# Patient Record
Sex: Male | Born: 1935 | Race: White | Hispanic: No | Marital: Single | Smoking: Former smoker
Health system: Southern US, Community
[De-identification: ages and names within clinical notes are randomized; demographics above are authoritative.]

## PROBLEM LIST (undated history)

## (undated) DIAGNOSIS — D126 Benign neoplasm of colon, unspecified: Secondary | ICD-10-CM

## (undated) DIAGNOSIS — I2510 Atherosclerotic heart disease of native coronary artery without angina pectoris: Secondary | ICD-10-CM

## (undated) DIAGNOSIS — E538 Deficiency of other specified B group vitamins: Secondary | ICD-10-CM

## (undated) DIAGNOSIS — I219 Acute myocardial infarction, unspecified: Secondary | ICD-10-CM

## (undated) DIAGNOSIS — N183 Chronic kidney disease, stage 3 unspecified: Secondary | ICD-10-CM

## (undated) DIAGNOSIS — G629 Polyneuropathy, unspecified: Secondary | ICD-10-CM

## (undated) DIAGNOSIS — R195 Other fecal abnormalities: Secondary | ICD-10-CM

## (undated) DIAGNOSIS — E669 Obesity, unspecified: Secondary | ICD-10-CM

## (undated) DIAGNOSIS — E119 Type 2 diabetes mellitus without complications: Secondary | ICD-10-CM

## (undated) DIAGNOSIS — E785 Hyperlipidemia, unspecified: Secondary | ICD-10-CM

## (undated) DIAGNOSIS — L409 Psoriasis, unspecified: Secondary | ICD-10-CM

## (undated) DIAGNOSIS — D649 Anemia, unspecified: Secondary | ICD-10-CM

## (undated) DIAGNOSIS — F419 Anxiety disorder, unspecified: Secondary | ICD-10-CM

## (undated) DIAGNOSIS — I10 Essential (primary) hypertension: Secondary | ICD-10-CM

## (undated) DIAGNOSIS — I82409 Acute embolism and thrombosis of unspecified deep veins of unspecified lower extremity: Secondary | ICD-10-CM

## (undated) DIAGNOSIS — F329 Major depressive disorder, single episode, unspecified: Secondary | ICD-10-CM

## (undated) DIAGNOSIS — G4733 Obstructive sleep apnea (adult) (pediatric): Secondary | ICD-10-CM

## (undated) DIAGNOSIS — K5790 Diverticulosis of intestine, part unspecified, without perforation or abscess without bleeding: Secondary | ICD-10-CM

## (undated) DIAGNOSIS — F32A Depression, unspecified: Secondary | ICD-10-CM

## (undated) HISTORY — PX: CORONARY STENT PLACEMENT: SHX1402

## (undated) HISTORY — DX: Essential (primary) hypertension: I10

## (undated) HISTORY — DX: Chronic kidney disease, stage 3 (moderate): N18.3

## (undated) HISTORY — DX: Hyperlipidemia, unspecified: E78.5

## (undated) HISTORY — DX: Obstructive sleep apnea (adult) (pediatric): G47.33

## (undated) HISTORY — DX: Anxiety disorder, unspecified: F41.9

## (undated) HISTORY — DX: Benign neoplasm of colon, unspecified: D12.6

## (undated) HISTORY — PX: INGUINAL HERNIA REPAIR: SUR1180

## (undated) HISTORY — DX: Diverticulosis of intestine, part unspecified, without perforation or abscess without bleeding: K57.90

## (undated) HISTORY — DX: Major depressive disorder, single episode, unspecified: F32.9

## (undated) HISTORY — DX: Chronic kidney disease, stage 3 unspecified: N18.30

## (undated) HISTORY — PX: CARDIAC CATHETERIZATION: SHX172

## (undated) HISTORY — DX: Type 2 diabetes mellitus without complications: E11.9

## (undated) HISTORY — DX: Psoriasis, unspecified: L40.9

## (undated) HISTORY — DX: Obesity, unspecified: E66.9

## (undated) HISTORY — DX: Deficiency of other specified B group vitamins: E53.8

## (undated) HISTORY — DX: Depression, unspecified: F32.A

## (undated) HISTORY — DX: Polyneuropathy, unspecified: G62.9

## (undated) HISTORY — DX: Acute myocardial infarction, unspecified: I21.9

## (undated) HISTORY — DX: Atherosclerotic heart disease of native coronary artery without angina pectoris: I25.10

## (undated) HISTORY — DX: Acute embolism and thrombosis of unspecified deep veins of unspecified lower extremity: I82.409

---

## 1993-05-29 DIAGNOSIS — D126 Benign neoplasm of colon, unspecified: Secondary | ICD-10-CM

## 1993-05-29 HISTORY — DX: Benign neoplasm of colon, unspecified: D12.6

## 1997-12-04 ENCOUNTER — Inpatient Hospital Stay (HOSPITAL_COMMUNITY): Admission: EM | Admit: 1997-12-04 | Discharge: 1997-12-05 | Payer: Self-pay | Admitting: Emergency Medicine

## 1998-02-09 ENCOUNTER — Encounter (HOSPITAL_COMMUNITY): Admission: RE | Admit: 1998-02-09 | Discharge: 1998-05-10 | Payer: Self-pay | Admitting: Cardiology

## 1999-01-09 ENCOUNTER — Encounter: Payer: Self-pay | Admitting: Pulmonary Disease

## 1999-01-09 ENCOUNTER — Ambulatory Visit: Admission: RE | Admit: 1999-01-09 | Discharge: 1999-01-09 | Payer: Self-pay | Admitting: Pulmonary Disease

## 1999-02-09 ENCOUNTER — Encounter: Payer: Self-pay | Admitting: Pulmonary Disease

## 1999-08-03 ENCOUNTER — Ambulatory Visit: Admission: RE | Admit: 1999-08-03 | Discharge: 1999-08-03 | Payer: Self-pay | Admitting: Pulmonary Disease

## 2000-12-12 ENCOUNTER — Encounter: Admission: RE | Admit: 2000-12-12 | Discharge: 2000-12-12 | Payer: Self-pay | Admitting: Family Medicine

## 2000-12-12 ENCOUNTER — Encounter: Payer: Self-pay | Admitting: Emergency Medicine

## 2000-12-12 ENCOUNTER — Emergency Department (HOSPITAL_COMMUNITY): Admission: EM | Admit: 2000-12-12 | Discharge: 2000-12-12 | Payer: Self-pay | Admitting: Emergency Medicine

## 2000-12-12 ENCOUNTER — Encounter: Payer: Self-pay | Admitting: Family Medicine

## 2002-12-09 ENCOUNTER — Ambulatory Visit (HOSPITAL_COMMUNITY): Admission: RE | Admit: 2002-12-09 | Discharge: 2002-12-09 | Payer: Self-pay | Admitting: Family Medicine

## 2002-12-17 ENCOUNTER — Ambulatory Visit (HOSPITAL_COMMUNITY): Admission: RE | Admit: 2002-12-17 | Discharge: 2002-12-17 | Payer: Self-pay | Admitting: Family Medicine

## 2003-06-02 ENCOUNTER — Encounter: Admission: RE | Admit: 2003-06-02 | Discharge: 2003-08-31 | Payer: Self-pay | Admitting: Family Medicine

## 2003-08-26 ENCOUNTER — Encounter: Admission: RE | Admit: 2003-08-26 | Discharge: 2003-08-26 | Payer: Self-pay | Admitting: Family Medicine

## 2003-10-20 ENCOUNTER — Encounter: Admission: RE | Admit: 2003-10-20 | Discharge: 2004-01-18 | Payer: Self-pay | Admitting: Family Medicine

## 2004-01-28 ENCOUNTER — Inpatient Hospital Stay (HOSPITAL_COMMUNITY): Admission: AD | Admit: 2004-01-28 | Discharge: 2004-01-30 | Payer: Self-pay | Admitting: Family Medicine

## 2004-02-01 ENCOUNTER — Ambulatory Visit (HOSPITAL_COMMUNITY): Admission: RE | Admit: 2004-02-01 | Discharge: 2004-02-01 | Payer: Self-pay | Admitting: Internal Medicine

## 2004-04-13 ENCOUNTER — Ambulatory Visit: Payer: Self-pay | Admitting: Pulmonary Disease

## 2004-11-18 ENCOUNTER — Ambulatory Visit (HOSPITAL_COMMUNITY): Admission: RE | Admit: 2004-11-18 | Discharge: 2004-11-18 | Payer: Self-pay | Admitting: Family Medicine

## 2004-12-26 ENCOUNTER — Ambulatory Visit (HOSPITAL_COMMUNITY): Admission: RE | Admit: 2004-12-26 | Discharge: 2004-12-26 | Payer: Self-pay | Admitting: Family Medicine

## 2004-12-30 ENCOUNTER — Ambulatory Visit (HOSPITAL_COMMUNITY): Admission: RE | Admit: 2004-12-30 | Discharge: 2004-12-30 | Payer: Self-pay | Admitting: Family Medicine

## 2005-03-20 ENCOUNTER — Ambulatory Visit: Payer: Self-pay | Admitting: Cardiology

## 2005-06-01 ENCOUNTER — Inpatient Hospital Stay (HOSPITAL_COMMUNITY): Admission: EM | Admit: 2005-06-01 | Discharge: 2005-06-03 | Payer: Self-pay | Admitting: Emergency Medicine

## 2006-02-28 ENCOUNTER — Ambulatory Visit: Payer: Self-pay | Admitting: Gastroenterology

## 2006-03-23 ENCOUNTER — Encounter (INDEPENDENT_AMBULATORY_CARE_PROVIDER_SITE_OTHER): Payer: Self-pay | Admitting: *Deleted

## 2006-03-23 ENCOUNTER — Ambulatory Visit: Payer: Self-pay | Admitting: Gastroenterology

## 2009-10-09 ENCOUNTER — Ambulatory Visit: Payer: Self-pay | Admitting: Cardiology

## 2009-10-09 ENCOUNTER — Inpatient Hospital Stay (HOSPITAL_COMMUNITY): Admission: EM | Admit: 2009-10-09 | Discharge: 2009-10-13 | Payer: Self-pay | Admitting: Emergency Medicine

## 2009-10-10 ENCOUNTER — Encounter: Payer: Self-pay | Admitting: Cardiology

## 2009-10-11 ENCOUNTER — Encounter (INDEPENDENT_AMBULATORY_CARE_PROVIDER_SITE_OTHER): Payer: Self-pay | Admitting: Internal Medicine

## 2009-10-11 HISTORY — PX: CHOLECYSTECTOMY: SHX55

## 2009-10-21 ENCOUNTER — Telehealth (INDEPENDENT_AMBULATORY_CARE_PROVIDER_SITE_OTHER): Payer: Self-pay | Admitting: *Deleted

## 2009-10-29 DIAGNOSIS — F411 Generalized anxiety disorder: Secondary | ICD-10-CM | POA: Insufficient documentation

## 2009-10-29 DIAGNOSIS — G4733 Obstructive sleep apnea (adult) (pediatric): Secondary | ICD-10-CM

## 2009-10-29 DIAGNOSIS — K5730 Diverticulosis of large intestine without perforation or abscess without bleeding: Secondary | ICD-10-CM | POA: Insufficient documentation

## 2009-10-29 DIAGNOSIS — E785 Hyperlipidemia, unspecified: Secondary | ICD-10-CM | POA: Insufficient documentation

## 2009-10-29 DIAGNOSIS — E119 Type 2 diabetes mellitus without complications: Secondary | ICD-10-CM

## 2009-10-29 DIAGNOSIS — I10 Essential (primary) hypertension: Secondary | ICD-10-CM | POA: Insufficient documentation

## 2009-11-01 ENCOUNTER — Ambulatory Visit: Payer: Self-pay | Admitting: Pulmonary Disease

## 2009-11-01 ENCOUNTER — Telehealth (INDEPENDENT_AMBULATORY_CARE_PROVIDER_SITE_OTHER): Payer: Self-pay | Admitting: *Deleted

## 2009-11-01 DIAGNOSIS — I82409 Acute embolism and thrombosis of unspecified deep veins of unspecified lower extremity: Secondary | ICD-10-CM

## 2009-11-03 ENCOUNTER — Telehealth: Payer: Self-pay | Admitting: Pulmonary Disease

## 2009-11-05 ENCOUNTER — Telehealth (INDEPENDENT_AMBULATORY_CARE_PROVIDER_SITE_OTHER): Payer: Self-pay | Admitting: *Deleted

## 2009-11-08 ENCOUNTER — Telehealth (INDEPENDENT_AMBULATORY_CARE_PROVIDER_SITE_OTHER): Payer: Self-pay | Admitting: *Deleted

## 2009-11-08 ENCOUNTER — Encounter: Payer: Self-pay | Admitting: Pulmonary Disease

## 2010-06-30 NOTE — Letter (Signed)
Summary: CMN for PAP/[facility]  CMN for PAP/[facility]   Imported By: Sherian Rein 11/11/2009 09:08:05  _____________________________________________________________________  External Attachment:    Type:   Image     Comment:   External Document

## 2010-06-30 NOTE — Progress Notes (Signed)
Summary: pt at [facility] now- wants supplies  Phone Note From Other Clinic   Caller: anita w/ [facility] Call For: clance Summary of Call: called back- says pt is in their office now and want supplies asap. anita # [phone] Initial call taken by: Tivis Ringer, CNA,  November 08, 2009 2:47 PM  Follow-up for Phone Call        forms on KC's desk per 11-08-09 phone note.    called spoke with anita who states that they have all of the patient's supplies ready to give him but CANNOT do this until the precert and cmn are signed by kc AND faxed back to [facility].  Megan, please make sure this is done thanks. Boone Master CNA/MA  November 08, 2009 3:00 PM   Additional Follow-up for Phone Call Additional follow up Details #1::        paperwork in [facility] very important look at folder.  Arman Filter LPN  November 08, 2009 3:25 PM     Additional Follow-up for Phone Call Additional follow up Details #2::    paperwork sign and faxed back to [facility].  Arman Filter LPN  November 08, 2009 5:17 PM

## 2010-06-30 NOTE — Medication Information (Signed)
Summary: PAP Supplies/[facility]  CMN for PAP Supplies/[facility]   Imported By: Sherian Rein 11/11/2009 09:06:03  _____________________________________________________________________  External Attachment:    Type:   Image     Comment:   External Document

## 2010-06-30 NOTE — Medication Information (Signed)
Summary: [facility]  [facility]   Imported By: Sherian Rein 11/01/2009 11:42:47  _____________________________________________________________________  External Attachment:    Type:   Image     Comment:   External Document

## 2010-06-30 NOTE — Progress Notes (Signed)
Summary: talk to nurse  Phone Note Call from Patient Call back at Home Phone [phone]   Caller: Spouse//pat Call For: clance Reason for Call: Talk to Nurse Summary of Call: Needs a new cpap mask, KC last saw him in "05. Initial call taken by: Darletta Moll,  Oct 21, 2009 3:53 PM  Follow-up for Phone Call        PT AWARE UNABLE TO GIVE RX FOR NEW MASK PT WILL NEED OV WITH KC FOR THIS, SHEDULED PT TO SEE KC 6/6 AT 9:45 Follow-up by: Philipp Deputy CMA,  Oct 21, 2009 4:20 PM

## 2010-06-30 NOTE — Progress Notes (Signed)
Summary: [facility]   Imported By: Sherian Rein 11/01/2009 11:39:27  _____________________________________________________________________  External Attachment:    Type:   Image     Comment:   External Document

## 2010-06-30 NOTE — Progress Notes (Signed)
Summary: oxygen needs to be d/c  Phone Note Call from Patient Call back at Home Phone [phone]   Caller: wife- pat Call For: Dandy Lazaro Summary of Call: pt saw you monday needs oxygen d/c [facility] Initial call taken by: Lacinda Axon,  November 03, 2009 2:14 PM  Follow-up for Phone Call        [facility]. Carron Curie CMA  November 03, 2009 2:30 PM  Pt wife states that they spoke to [facility] at last ov about pt using oxygen with his Cpap and KC told them he does not need this so they are requesting an order be placed to [facility] to have O2 d/c. Please advise if ok to send order.Carron Curie CMA  November 03, 2009 4:08 PM   Additional Follow-up for Phone Call Additional follow up Details #1::        I do not recall telling him this, and there is no mention of it in my note.  I need his chart to be pulled to see where the oxygen came from.Marland KitchenMarland KitchenMarland KitchenI do not think we ever ordered his oxygen. Additional Follow-up by: Barbaraann Share MD,  November 03, 2009 5:27 PM    Additional Follow-up for Phone Call Additional follow up Details #2::    ordered chart which is at [address].  Aundra Millet Reynolds LPN  November 04, 979 5:28 PM   chart on KC's very important look at folder.  Aundra Millet Reynolds LPN  November 04, 1912 8:45 AM   Additional Follow-up for Phone Call Additional follow up Details #3:: Details for Additional Follow-up Action Taken: I have reviewed his old chart.  I did not start him on oxyen, there is no record who did or for what reason?  We need to find out who started and why?  please discuss with pcc. Additional Follow-up by: Barbaraann Share MD,  November 04, 2009 1:48 PM  Almyra Free called Mayra Reel and left a message to have her check to see who ordered the oxygen. Carron Curie CMA  November 04, 2009 2:37 PM  Mayra Reel states original order for oxygen came from Dr. Cleatis Polka. which looks like he is pt PMD according to hospital records. I called Dr. Leandro Reasoner office but he is out until  tomorrow so Ieft a message with nurse to find out why pt was started on O2.  Carron Curie CMA  November 04, 2009 3:36 PM  Per [facility] if PMD ordered oxygen then they need to d/c it. Pt aware to contact Dr. Clelia Croft to d/c O2. Carron Curie CMA  November 04, 2009 3:48 PM [Prescriptions]

## 2010-06-30 NOTE — Progress Notes (Signed)
Summary: [facility] cmn, recert  Phone Note Call from Patient Call back at Home Phone [phone]   Caller: Patient Call For: clance Reason for Call: Talk to Nurse Summary of Call: Patient calling asking about a letter from [facility] for oxygen.  Requesting a call ASAP. Initial call taken by: Lehman Prom,  November 05, 2009 10:34 AM  Follow-up for Phone Call        Pt states that went to [facility] to get new mask and supplies, order placed by [facility] at last OV, but they were told they could not have any of the supplies until a form is signed by [facility] that was faxed on Wed.  Aundra Millet do you have these forms? Please advise.Carron Curie CMA  November 05, 2009 11:29 AM  have not seen paperwork on this pt.  called and spoke with [facility] and re-requested order be faxed to [place].  Aundra Millet Reynolds LPN  November 05, 2009 3:32 PM   still no paperwork sent from [place].  Called and LM with Operator for [facility] to call me back ( as they were currently in a meeting).  Aundra Millet Reynolds LPN  November 08, 2009 9:40 AM   Additional Follow-up for Phone Call Additional follow up Details #1::        returning call [phone] annita .Marland KitchenChantel Bowne  November 08, 2009 1:04 PM  returned anita's call.  she states that she faxed the recert and cmn friday to [phone], but i informed her that Aundra Millet still hasn't received the forms.  i then gave her the triage fax # and she will fax them now.  will hand to [facility] when they arrive. Additional Follow-up by: Boone Master CNA/MA,  November 08, 2009 1:55 PM    Additional Follow-up for Phone Call Additional follow up Details #2::    forms received and handed to [facility]. Boone Master CNA/MA  November 08, 2009 1:57 PM    Appended Document: [facility] cmn, recert put forms in KC's very important look at folder for him to review.

## 2010-06-30 NOTE — Assessment & Plan Note (Signed)
Summary: self referral for management of osa   Copy to:  Self- referral.   Primary Provider/Referring Provider:  Martha Clan  CC:   Former pt.  Last seen by Dr. Shelle Iron in 2005. Marland Kitchen  History of Present Illness: The pt is an 77y/o male who comes in today as a self referral to re-establish for management of osa.  I have not seen the pt since 2005, and he is in need of supplies and a new mask.  He was diagnosed with severe osa in 2000 with AHI of 50/hr and desat to 69%.  He was started on cpap and did well, and received a new machine in 2005.  His machine was replaced again a few years back and currently is in good working order.  His current mask is leaking excessively.  He goes to bed at 11pm, and arises at 9am to start his day.  He feels rested upon arising, and denies any breakthru snoring.  He feels that his alertness is adequate  during the day, and he has no issues watching tv or reading.  His weight is actually down 27 pounds since the last time I saw him.  HIs epworth score today is 7.  Preventive Screening-Counseling & Management  Alcohol-Tobacco     Smoking Status: quit  Allergies (verified): No Known Drug Allergies  Past History:  Past Medical History: CAD DVT (ICD-453.40)-on coumadin OBSTRUCTIVE SLEEP APNEA (ICD-327.23) ANXIETY (ICD-300.00) HYPERLIPIDEMIA (ICD-272.4) DIABETES MELLITUS, TYPE II (ICD-250.00) DIVERTICULOSIS, COLON (ICD-562.10) HYPERTENSION (ICD-401.9)    Past Surgical History: cholecystectomy  Oct 11, 2009 stent  Family History: Reviewed history and no changes required. emphysema: mother  Social History: Reviewed history and no changes required. Patient states former smoker.  started at age 30.  2 ppd.  quit 1980. pt is married. pt has children. pt is retired.  prev worked with [employer].  Smoking Status:  quit  Review of Systems       The patient complains of weight change.  The patient denies shortness of breath with activity, shortness of  breath at rest, productive cough, non-productive cough, coughing up blood, chest pain, irregular heartbeats, acid heartburn, indigestion, loss of appetite, abdominal pain, difficulty swallowing, sore throat, tooth/dental problems, headaches, nasal congestion/difficulty breathing through nose, sneezing, itching, ear ache, anxiety, depression, hand/feet swelling, joint stiffness or pain, rash, change in color of mucus, and fever.    Vital Signs:  Patient profile:   77 year old male Height:      71 inches Weight:      211.31 pounds BMI:     29.58 O2 Sat:      95 % on Room air Temp:     97.7 degrees F oral Pulse rate:   61 / minute BP sitting:   126 / 70  (right arm) Cuff size:   regular  Vitals Entered By: Arman Filter LPN (November 01, 452 9:23 AM)  O2 Flow:  Room air CC:  Former pt.  Last seen by Dr. Shelle Iron in 2005.  Comments Unable to review meds with pt.  Pt did not bring meds or a med list with him to today's visit and did not know what meds he is currently taking. Arman Filter LPN  November 02, 979 9:28 AM    Physical Exam  General:  89 male in nad Eyes:  PERRLA and EOMI.   Nose:  patent without discharge, no crusting no skin breakdown or pressure necrosis from cpap mask Mouth:  clear Neck:  no  jvd, tmg, LN Lungs:  clear to auscultation Heart:  rrr, no mrg Abdomen:  soft and nontender, bs+ Extremities:  mild edema and changes of venous stasis no cyanosis pulses intact distally Neurologic:  alert and oriented, moves all 4.   Impression & Recommendations:  Problem # 1:  OBSTRUCTIVE SLEEP APNEA (ICD-327.23) the pt has known severe osa, and has been compliant with cpap.  He feels that he sleeps fairly well, and is satisfied with his alertness during the day.  He needs a new mask and supplies, and I have encouraged him to continue working on weight loss.  I have stressed to him the importance that I see him yearly, and that I cannot write for his supplies and other items thru  his dme without seeing him at least yearly.  Other Orders: New Patient Level III (84696) DME Referral (DME)  Patient Instructions: 1)  will get you a new mask 2)  continue to work on weight loss 3)  please call if any issues with cpap 4)  followup with me in one year.

## 2010-06-30 NOTE — Progress Notes (Signed)
Summary: meds list  Phone Note Call from Patient Call back at Home Phone [phone]   Caller: Spouse Call For: clance Summary of Call: pt's wife calling to give nurse a list of his meds (about 10 of them). NOTE: pt was seen this am.  Initial call taken by: Tivis Ringer, CNA,  November 01, 2009 11:36 AM  Follow-up for Phone Call        Called, spoke with pt's wife.  Updated pt's med list.  Gweneth Dimitri RN  November 01, 2009 11:54 AM     New/Updated Medications: CRESTOR 20 MG TABS (ROSUVASTATIN CALCIUM) Take 1 tablet by mouth once a day FOLIC ACID 1 MG TABS (FOLIC ACID) Take 1 tablet by mouth once a day ISOSORBIDE MONONITRATE CR 60 MG XR24H-TAB (ISOSORBIDE MONONITRATE) Take 1 tablet by mouth once a day METFORMIN HCL 500 MG TABS (METFORMIN HCL) Take 1 tablet by mouth two times a day METOPROLOL TARTRATE 100 MG TABS (METOPROLOL TARTRATE) 1/2 tab two times a day NIASPAN 1000 MG CR-TABS (NIACIN (ANTIHYPERLIPIDEMIC)) Take 1 tab by mouth at bedtime PAROXETINE HCL 20 MG TABS (PAROXETINE HCL) Take 1 tab by mouth at bedtime RAMIPRIL 2.5 MG CAPS (RAMIPRIL) Take 1 capsule by mouth once a day TRICOR 145 MG TABS (FENOFIBRATE) Take 1 tablet by mouth once a day WARFARIN SODIUM 3 MG TABS (WARFARIN SODIUM) as directed ASPIR-LOW 81 MG TBEC (ASPIRIN) Take 1 tablet by mouth once a day

## 2010-08-15 LAB — PROTIME-INR
INR: 1.45 (ref 0.00–1.49)
INR: 1.63 — ABNORMAL HIGH (ref 0.00–1.49)
Prothrombin Time: 17.5 seconds — ABNORMAL HIGH (ref 11.6–15.2)
Prothrombin Time: 19.2 seconds — ABNORMAL HIGH (ref 11.6–15.2)
Prothrombin Time: 19.6 seconds — ABNORMAL HIGH (ref 11.6–15.2)
Prothrombin Time: 19.6 seconds — ABNORMAL HIGH (ref 11.6–15.2)

## 2010-08-15 LAB — CBC
HCT: 32.7 % — ABNORMAL LOW (ref 39.0–52.0)
HCT: 33.4 % — ABNORMAL LOW (ref 39.0–52.0)
HCT: 34 % — ABNORMAL LOW (ref 39.0–52.0)
HCT: 34.7 % — ABNORMAL LOW (ref 39.0–52.0)
Hemoglobin: 11 g/dL — ABNORMAL LOW (ref 13.0–17.0)
Hemoglobin: 11.7 g/dL — ABNORMAL LOW (ref 13.0–17.0)
Hemoglobin: 11.8 g/dL — ABNORMAL LOW (ref 13.0–17.0)
Hemoglobin: 12 g/dL — ABNORMAL LOW (ref 13.0–17.0)
MCHC: 33.8 g/dL (ref 30.0–36.0)
MCHC: 34.5 g/dL (ref 30.0–36.0)
MCHC: 34.6 g/dL (ref 30.0–36.0)
MCHC: 35.2 g/dL (ref 30.0–36.0)
MCV: 95.8 fL (ref 78.0–100.0)
MCV: 96.8 fL (ref 78.0–100.0)
MCV: 96.8 fL (ref 78.0–100.0)
Platelets: 200 10*3/uL (ref 150–400)
Platelets: 215 10*3/uL (ref 150–400)
RBC: 3.5 MIL/uL — ABNORMAL LOW (ref 4.22–5.81)
RBC: 3.54 MIL/uL — ABNORMAL LOW (ref 4.22–5.81)
RBC: 3.58 MIL/uL — ABNORMAL LOW (ref 4.22–5.81)
RDW: 13.7 % (ref 11.5–15.5)
RDW: 13.9 % (ref 11.5–15.5)
RDW: 14.3 % (ref 11.5–15.5)
WBC: 6.2 10*3/uL (ref 4.0–10.5)
WBC: 6.7 10*3/uL (ref 4.0–10.5)

## 2010-08-15 LAB — GLUCOSE, CAPILLARY
Glucose-Capillary: 115 mg/dL — ABNORMAL HIGH (ref 70–99)
Glucose-Capillary: 115 mg/dL — ABNORMAL HIGH (ref 70–99)
Glucose-Capillary: 135 mg/dL — ABNORMAL HIGH (ref 70–99)
Glucose-Capillary: 172 mg/dL — ABNORMAL HIGH (ref 70–99)
Glucose-Capillary: 93 mg/dL (ref 70–99)
Glucose-Capillary: 95 mg/dL (ref 70–99)

## 2010-08-15 LAB — COMPREHENSIVE METABOLIC PANEL
ALT: 21 U/L (ref 0–53)
ALT: 28 U/L (ref 0–53)
AST: 26 U/L (ref 0–37)
Albumin: 2.8 g/dL — ABNORMAL LOW (ref 3.5–5.2)
Alkaline Phosphatase: 61 U/L (ref 39–117)
Alkaline Phosphatase: 77 U/L (ref 39–117)
BUN: 11 mg/dL (ref 6–23)
BUN: 11 mg/dL (ref 6–23)
BUN: 12 mg/dL (ref 6–23)
CO2: 24 mEq/L (ref 19–32)
Calcium: 8.5 mg/dL (ref 8.4–10.5)
Creatinine, Ser: 0.7 mg/dL (ref 0.4–1.5)
GFR calc Af Amer: >60 mL/min (ref >60)
GFR calc non Af Amer: >60 mL/min (ref >60)
Glucose, Bld: 109 mg/dL — ABNORMAL HIGH (ref 70–99)
Glucose, Bld: 137 mg/dL — ABNORMAL HIGH (ref 70–99)
Glucose, Bld: 143 mg/dL — ABNORMAL HIGH (ref 70–99)
Potassium: 3.9 mEq/L (ref 3.5–5.1)
Potassium: 3.9 mEq/L (ref 3.5–5.1)
Potassium: 4.4 mEq/L (ref 3.5–5.1)
Sodium: 135 mEq/L (ref 135–145)
Sodium: 135 mEq/L (ref 135–145)
Total Bilirubin: 0.8 mg/dL (ref 0.3–1.2)
Total Protein: 6 g/dL (ref 6.0–8.3)
Total Protein: 6.4 g/dL (ref 6.0–8.3)

## 2010-08-15 LAB — HEPARIN LEVEL (UNFRACTIONATED)
Heparin Unfractionated: 0.13 IU/mL — ABNORMAL LOW (ref 0.30–0.70)
Heparin Unfractionated: <0.1 IU/mL — ABNORMAL LOW (ref 0.30–0.70)
Heparin Unfractionated: <0.1 IU/mL — ABNORMAL LOW (ref 0.30–0.70)
Heparin Unfractionated: <0.1 IU/mL — ABNORMAL LOW (ref 0.30–0.70)

## 2010-08-16 LAB — COMPREHENSIVE METABOLIC PANEL
ALT: 17 U/L (ref 0–53)
AST: 23 U/L (ref 0–37)
Albumin: 3.4 g/dL — ABNORMAL LOW (ref 3.5–5.2)
Alkaline Phosphatase: 48 U/L (ref 39–117)
BUN: 19 mg/dL (ref 6–23)
CO2: 26 mEq/L (ref 19–32)
CO2: 27 mEq/L (ref 19–32)
Calcium: 8.8 mg/dL (ref 8.4–10.5)
Chloride: 102 mEq/L (ref 96–112)
Creatinine, Ser: 1.1 mg/dL (ref 0.4–1.5)
GFR calc Af Amer: >60 mL/min (ref >60)
GFR calc non Af Amer: >60 mL/min (ref >60)
Glucose, Bld: 102 mg/dL — ABNORMAL HIGH (ref 70–99)
Glucose, Bld: 108 mg/dL — ABNORMAL HIGH (ref 70–99)
Potassium: 4.5 mEq/L (ref 3.5–5.1)
Sodium: 135 mEq/L (ref 135–145)
Total Bilirubin: 0.9 mg/dL (ref 0.3–1.2)
Total Protein: 6.6 g/dL (ref 6.0–8.3)

## 2010-08-16 LAB — GLUCOSE, CAPILLARY
Glucose-Capillary: 105 mg/dL — ABNORMAL HIGH (ref 70–99)
Glucose-Capillary: 115 mg/dL — ABNORMAL HIGH (ref 70–99)
Glucose-Capillary: 138 mg/dL — ABNORMAL HIGH (ref 70–99)
Glucose-Capillary: 176 mg/dL — ABNORMAL HIGH (ref 70–99)

## 2010-08-16 LAB — URINALYSIS, ROUTINE W REFLEX MICROSCOPIC
Bilirubin Urine: NEGATIVE
Glucose, UA: NEGATIVE mg/dL
Hgb urine dipstick: NEGATIVE
Protein, ur: NEGATIVE mg/dL
Urobilinogen, UA: 1 mg/dL (ref 0.0–1.0)

## 2010-08-16 LAB — CBC
HCT: 37.6 % — ABNORMAL LOW (ref 39.0–52.0)
Hemoglobin: 12.6 g/dL — ABNORMAL LOW (ref 13.0–17.0)
MCHC: 34.4 g/dL (ref 30.0–36.0)
MCV: 96.2 fL (ref 78.0–100.0)
Platelets: 208 10*3/uL (ref 150–400)
RBC: 3.76 MIL/uL — ABNORMAL LOW (ref 4.22–5.81)
RDW: 14 % (ref 11.5–15.5)
WBC: 12.7 10*3/uL — ABNORMAL HIGH (ref 4.0–10.5)

## 2010-08-16 LAB — DIFFERENTIAL
Basophils Absolute: 0 10*3/uL (ref 0.0–0.1)
Lymphocytes Relative: 12 % (ref 12–46)
Neutro Abs: 9.8 10*3/uL — ABNORMAL HIGH (ref 1.7–7.7)
Neutrophils Relative %: 77 % (ref 43–77)

## 2010-08-16 LAB — PROTIME-INR: Prothrombin Time: 19 seconds — ABNORMAL HIGH (ref 11.6–15.2)
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
## 2010-10-14 NOTE — Discharge Summary (Signed)
Alejandro Russell, Alejandro Russell               ACCOUNT NO.:  [id number]   MEDICAL RECORD NO.:  [id number]          PATIENT TYPE:  INP   LOCATION:  4710                         FACILITY:  [facility]   PHYSICIAN:  Marcie Mowers, M.D.DATE OF BIRTH:  06/03/1927   DATE OF ADMISSION:  01/28/2004  DATE OF DISCHARGE:  01/30/2004                                 DISCHARGE SUMMARY   DISCHARGE DIAGNOSES:  1.  Acute left lower extremity deep venous thrombosis.  2. Diabetes, type 2.      3. Hypertension.  4. Dyslipidemia.  5. Pulmonary nodules on chest x-ray.      6. Obstructive sleep apnea.   DISCHARGE MEDICATIONS:  1.  Coumadin 2.5 mg two pills to be taken every evening.  2. Avandia 2 mg      daily.  3. Lopressor 12.5 mg one pill daily.  4. Aspirin 81 mg daily.      5. Tricor 145 mg daily.  6. Paxil 20 mg daily.  7. Isosorbide      mononitrate 30 mg p.o. daily.  8. Lovenox 105 mg subcu every 12 hours.   ADMISSION HISTORY AND PHYSICAL:  In short, the patient is a 77 year old  Caucasian male with history of diabetes, type 2, superficial phlebitis,  hypertension, obstructive sleep apnea, was recently on a cruise for one  week.  He presented to the emergency department with left lower extremity  pain for three weeks.  He saw his primary care physician, Dr. Andrey Campanile, on  September 1 and had a lower extremity Doppler which showed extensive DVT in  his left lower extremity.  For this reason, he was admitted to [facility].  Upon admission, the patient did not have any chest pain,  shortness of breath, fever, nausea, vomiting, or diarrhea.   CURRENT MEDICATIONS:  1.  Avandia.  2. Imdur.  3. Paxil.  4. Lopressor.  5. Tricor.  6. Baby      aspirin.   ALLERGIES:  No known drug allergies.   PHYSICAL EXAMINATION:  Normal except for the tenderness in his left lower  extremity.  For a detailed H&P please refer to the history and physical note  done by Dr. Mikeal Hawthorne on January 28, 2004.   ADMISSION  LABORATORIES:  WBC count of 9.2, hemoglobin 14.9, hematocrit of  42.8, MCV 95.4.  The differential was normal.  Sodium 132, potassium of 5.1,  chloride 98, CO2 26, glucose 102, BUN 21, creatinine of 1.2.  Total  bilirubin 1.0, alkaline phosphatase 42, AST 19, ALT 15, total protein 7.  Albumin 3.7, calcium 8.9.  His PT was 13, INR was 1.0, PTT 34.  Hemoglobin  A1c was 6.3.   Lower extremity venous Doppler study which was done at [facility] showed acute DVT in the left lower extremity with minimal flow noted  from distal posterior tibial vein through the popliteal and into the distal  femoral vein.  Superficial thrombosis of greater saphenous vein proximal.  No evidence of Baker's cyst.   HOSPITAL COURSE:  Problem 1. Acute left lower extremity deep venous  thrombosis.  The patient was admitted for anticoagulation.  He was started  on Lovenox and Coumadin as well.  The patient and wife were taught Lovenox  administration.  The goal INR was between 2 and 3.  His admission INR was  1.0.  On the day of discharge, his INR was still 1.3, although because the  patient and his wife were comfortable with Lovenox administration the  patient was discharged home with Lovenox 105 mg every 12 hours subcu and on  Coumadin 5 mg q.h.s.  He was given a prescription to get his INR checked in  the [facility] on February 01, 2004 and the results were to  be reported to his primary care physician, Dr. Andrey Campanile.   Problem 2. Diabetes, type 2.  Throughout the hospital stay, his blood sugars  were well-controlled.  His hemoglobin A1c was 6.3.  He was continued on his  home dose of 2 mg daily of Avandia.   Problem 3. Hypertension.  Throughout the hospital stay his blood pressure  was well-controlled on Lopressor 12.5 mg daily and he was also on isosorbide  mononitrate.   Problem 4. Dyslipidemia.  Was on Tricor throughout his hospital stay.   Problem 5. The patient had chest x-ray  done on January 29, 2004 which  showed three or four small nodules in the lungs, reportedly stable since the  CT of his chest done five months ago.  The patient could be worked up as an  outpatient, but throughout current hospital stay he had no suggestive  symptoms.   Problem 6. Obstructive sleep apnea.  The patient was stable throughout his  hospital stay.  He was continued on his nightly CPAP.  The patient was on  Paxil at home for a long time, aware of the drug interaction between Paxil  and Coumadin and that Paxil may increase the levels of Coumadin.  The  patient was to have a regular INR check as an outpatient.  If required, the  dose of Paxil could be adjusted accordingly.   DISCHARGE LABORATORIES:  His INR was 1.3, PT 15.5.  White count 5.9,  hemoglobin 15, hematocrit 42.7, platelet count was 323.   DISPOSITION:  The patient was to be discharged home with Lovenox 105 mg  subcu q.12h., Coumadin 5 mg p.o. q.h.s..  The INR was to be checked on  February 01, 2004 at outpatient lab at [facility], the results were  to be reported to his primary care physician, Dr. Margrett Rud.  The  patient was to follow up with his primary care physician for his regular INR  check and maintenance of Coumadin.  He was instructed to see Dr. Andrey Campanile on  Tuesday, February 02, 2004.  The patient was given the information brochure  on Lovenox.  He was instructed to avoid green leafy vegetables.       PMJ/MEDQ  D:  04/03/2004  T:  04/03/2004  Job:  [id number]   cc:   [place]. Andrey Campanile, M.D.  [id number]  [place]  [place] [address]  Fax: [phone]

## 2010-10-14 NOTE — Assessment & Plan Note (Signed)
[facility]                           GASTROENTEROLOGY OFFICE NOTE   NAME:Macomber, JEREMAINE MARAJ                      MRN:          [id number]  DATE:02/28/2006                            DOB:          1927/09/05    REASON FOR REFERRAL:  Personal history of colon polyps and Coumadin  anticoagulation.   HISTORY OF PRESENT ILLNESS:  Mr. Stauffer is a very nice 77 year old white male  who I have seen in the past.  He had a large tubulovillous adenoma removed  from his sigmoid colon endoscopically in 1995.  His most recent followup  colonoscopy was in July 2000 which showed only diverticulosis and  phlebectasias.  He has been maintained on Coumadin anticoagulation for  recurrent deep venous thromboses.  He relates no gastrointestinal complaints  and his gastrointestinal review of systems is entirely negative.  No family  history of colon cancer, colon polyps, or inflammatory bowel disease.   PAST MEDICAL HISTORY:  1. Hypertension.  2. Diverticulosis.  3. Diabetes.  4. Hyperlipidemia.  5. Anxiety.  6. Sleep apnea on CPAP.  7. Tubulovillous adenomatous colon polyps.   Current medications listed on the chart have been reviewed.   MEDICATION ALLERGIES:  None known.   SOCIAL HISTORY AND REVIEW OF SYSTEMS:  Per the handwritten form.   PHYSICAL EXAMINATION:  GENERAL:  No acute distress.  VITAL SIGNS:  Weight 227 pounds, height 6 feet, blood pressure is 130/62,  pulse 66 and regular.  HEENT:  Anicteric sclerae, oropharynx clear.  CHEST:  Clear to auscultation bilaterally.  CARDIAC:  Regular rate and rhythm without murmurs appreciated.  ABDOMEN:  Soft, nontender, nondistended, normal active bowel sounds.  No  palpable organomegaly, masses or hernias.  RECTAL:  Deferred to time of colonoscopy.  EXTREMITIES:  Without clubbing, cyanosis, or edema.  NEUROLOGIC:  Alert and oriented x3.  Grossly nonfocal.   LABORATORY DATA:  From January 30, 2006, per Dr Alver Fisher  office:  CMET and  CBC were unremarkable except for a mildly elevated potassium and elevated  MCV at 100.  Pro time was 35.4 seconds with an INR of 3.26 on Coumadin  anticoagulation.   ASSESSMENT AND PLAN:  Personal history of a tubulovillous adenomatous colon  polyp.  Ongoing Coumadin anticoagulation for deep venous thromboses.  Risks,  benefits and alternatives to colonoscopy with possible biopsy and possible  polypectomy off Coumadin anticoagulation discussed with the patient.  He  consents to proceed.  This will be scheduled electively.  Will hold Coumadin  for 5 days prior to the procedure if this is cleared by Dr. Clelia Croft.       Venita Lick. Russella Dar, MD, [facility]      MTS/MedQ  DD:  03/05/2006  DT:  03/06/2006  Job #:  [id number]   cc:   Kari Baars, M.D.
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
## 2011-02-14 ENCOUNTER — Encounter: Payer: Self-pay | Admitting: Pulmonary Disease

## 2011-02-15 ENCOUNTER — Ambulatory Visit (INDEPENDENT_AMBULATORY_CARE_PROVIDER_SITE_OTHER): Payer: 59 | Admitting: Pulmonary Disease

## 2011-02-15 ENCOUNTER — Encounter: Payer: Self-pay | Admitting: Pulmonary Disease

## 2011-02-15 VITALS — BP 138/64 | HR 67 | Temp 97.8°F | Ht 71.0 in | Wt 220.0 lb

## 2011-02-15 DIAGNOSIS — G4733 Obstructive sleep apnea (adult) (pediatric): Secondary | ICD-10-CM

## 2011-02-15 NOTE — Assessment & Plan Note (Signed)
The patient is doing well with CPAP, and feels that he is sleeping satisfactorily, and has adequate daytime alertness.  I have asked him to keep up with mask changes and supplies, and work aggressively on weight loss.  If he continues to do well, he is to followup with me in one year.

## 2011-02-15 NOTE — Patient Instructions (Signed)
Keep up with mask changes, and found out how old your cpap machine is.  If greater than 5-6, needs replacement. Work on weight loss followup with me in one year.

## 2011-02-15 NOTE — Progress Notes (Signed)
  Subjective:    Patient ID: Alejandro Russell, male    DOB: 1928-03-30, 77 y.o.   MRN: [id number]  HPI The patient comes in today for followup of his known severe obstructive sleep apnea.  He is wearing CPAP compliantly, and is having no issues with his mask fit or pressure.  He is due for a new mask, and I have also encouraged him to keep up with supplies.  He feels that he is sleeping well with adequate daytime alertness.  His weight has increased about 8 pounds since his last visit.   Review of Systems  Constitutional: Negative for fever and unexpected weight change.  HENT: Positive for rhinorrhea, sneezing and sinus pressure. Negative for ear pain, nosebleeds, congestion, sore throat, trouble swallowing, dental problem and postnasal drip.   Eyes: Positive for itching. Negative for redness.  Respiratory: Positive for cough. Negative for chest tightness, shortness of breath and wheezing.   Cardiovascular: Positive for leg swelling. Negative for palpitations.  Gastrointestinal: Negative for nausea and vomiting.  Genitourinary: Negative for dysuria.  Musculoskeletal: Negative for joint swelling.  Skin: Positive for rash.  Neurological: Negative for headaches.  Hematological: Does not bruise/bleed easily.  Psychiatric/Behavioral: Negative for dysphoric mood. The patient is not nervous/anxious.        Objective:   Physical Exam Overweight male in no acute distress No skin breakdown or pressure necrosis from the CPAP mask Lower extremities with minimal edema, no cyanosis noted Alert, does not appear to be sleepy, moves all 4 extremities.       Assessment & Plan:

## 2011-03-31 ENCOUNTER — Other Ambulatory Visit: Payer: Self-pay | Admitting: Dermatology

## 2011-04-05 ENCOUNTER — Encounter: Payer: Self-pay | Admitting: Gastroenterology

## 2011-04-05 DIAGNOSIS — E1121 Type 2 diabetes mellitus with diabetic nephropathy: Secondary | ICD-10-CM | POA: Insufficient documentation

## 2011-04-25 ENCOUNTER — Ambulatory Visit (INDEPENDENT_AMBULATORY_CARE_PROVIDER_SITE_OTHER): Payer: 59 | Admitting: Gastroenterology

## 2011-04-25 ENCOUNTER — Encounter: Payer: Self-pay | Admitting: Gastroenterology

## 2011-04-25 VITALS — BP 132/62 | HR 68 | Ht 71.0 in | Wt 225.6 lb

## 2011-04-25 DIAGNOSIS — R198 Other specified symptoms and signs involving the digestive system and abdomen: Secondary | ICD-10-CM

## 2011-04-25 DIAGNOSIS — Z86010 Personal history of colonic polyps: Secondary | ICD-10-CM

## 2011-04-25 DIAGNOSIS — Z7901 Long term (current) use of anticoagulants: Secondary | ICD-10-CM

## 2011-04-25 MED ORDER — PEG-KCL-NACL-NASULF-NA ASC-C 100 G PO SOLR: 1.0000 | Freq: Once | ORAL | Status: DC

## 2011-04-25 NOTE — Progress Notes (Signed)
History of Present Illness: This is an 77 year old male who notes larger stools and decreased frequency of bowel movements to about every third day for the past several months. He notes his stools are intermittently dark and occasionally black. He denies red, maroon, or tarry stools. He has a prior history of tubulovillous adenomatous colon polyps initially diagnosed in 1995. Last colonoscopy was performed in October 2007 showing an ascending colon lipoma, diverticulosis and phlebectasias. Denies weight loss, abdominal pain, diarrhea, melena, hematochezia, nausea, vomiting, dysphagia, reflux symptoms, chest pain.  No Known Allergies Outpatient Prescriptions Prior to Visit  Medication Sig Dispense Refill  . aspirin 81 MG tablet Take 81 mg by mouth daily.        . clobetasol (TEMOVATE) 0.05 % external solution Apply topically as needed.        . fenofibrate (TRICOR) 145 MG tablet Take 145 mg by mouth daily.        . folic acid (FOLVITE) 1 MG tablet Take 1 mg by mouth daily.        . isosorbide mononitrate (IMDUR) 60 MG 24 hr tablet Take 60 mg by mouth daily.        . metFORMIN (GLUCOPHAGE) 500 MG tablet Take 500 mg by mouth 2 (two) times daily.        . metoprolol (LOPRESSOR) 100 MG tablet Take 50 mg by mouth 2 (two) times daily.        . niacin (NIASPAN) 1000 MG CR tablet Take 1,000 mg by mouth at bedtime.        Marland Kitchen PARoxetine (PAXIL) 20 MG tablet Take 20 mg by mouth daily.        . ramipril (ALTACE) 2.5 MG capsule Take 2.5 mg by mouth daily.        . rosuvastatin (CRESTOR) 20 MG tablet Take 20 mg by mouth daily.        Marland Kitchen warfarin (COUMADIN) 3 MG tablet Take 3 mg by mouth as directed. M,W,F 1.5 mg / T TH, S, Sun 3 MG.       Past Medical History  Diagnosis Date  . CAD (coronary artery disease)   . DVT (deep venous thrombosis)     on coumadin  . OSA (obstructive sleep apnea)   . Anxiety   . Hyperlipidemia   . Type 2 diabetes mellitus   . Diverticulosis   . Hypertension   . Tubulovillous  adenoma of colon 1995  . Diverticulosis   . Psoriasis   . Vitamin B12 deficiency   . Obesity   . Myocardial infarct   . Peripheral neuropathy   . Chronic renal disease, stage III    Past Surgical History  Procedure Date  . Cholecystectomy may 16,2011  . Coronary stent placement   . Inguinal hernia repair    History   Social History  . Marital Status: Single    Spouse Name: N/A    Number of Children: Y  . Years of Education: N/A   Occupational History  . retired.  prev worked with computers    Social History Main Topics  . Smoking status: Former Smoker -- 2.0 packs/day for 31 years    Types: Cigarettes    Quit date: 05/29/1978  . Smokeless tobacco: None  . Alcohol Use: No     Hx of alcohol abuse  . Drug Use: No  . Sexually Active: None   Other Topics Concern  . None   Social History Narrative  . None   Family History  Problem  Relation Age of Onset  . Emphysema Mother   . Dementia Father     Review of Systems: Pertinent positive and negative review of systems were noted in the above HPI section. All other review of systems were otherwise negative.  Physical Exam: General: Well developed , well nourished, no acute distress Head: Normocephalic and atraumatic Eyes:  sclerae anicteric, EOMI Ears: Normal auditory acuity Mouth: No deformity or lesions Neck: Supple, no masses or thyromegaly Lungs: Clear throughout to auscultation Heart: Regular rate and rhythm; no murmurs, rubs or bruits Abdomen: Soft, non tender and non distended. No masses, hepatosplenomegaly or hernias noted. Normal Bowel sounds Rectal: Deferred to colonoscopy Musculoskeletal: Symmetrical with no gross deformities  Skin: No lesions on visible extremities Pulses:  Normal pulses noted Extremities: No clubbing, cyanosis, edema or deformities noted Neurological: Alert oriented x 4, memory deficits, otherwise grossly nonfocal Cervical Nodes:  No significant cervical adenopathy Inguinal Nodes:  No significant inguinal adenopathy Psychological:  Alert and cooperative. Normal mood and affect  Assessment and Recommendations:  1. Change in bowel habits with mild constipation, larger stools and occasional dark stools. Hemasure testing was negative. Rule out intermittent bleeding leading to dark stools but I suspect this is an insignificant variation in stool color. Increase dietary fiber and water intake. Schedule colonoscopy. He has multiple comorbidities including chronic Coumadin anticoagulation for a history of DVTs, obstructive sleep apnea, coronary artery disease and diabetes mellitus which slightly increases the risk of colonoscopy. The risks, benefits, and alternatives to colonoscopy with possible biopsy and possible polypectomy were discussed with the patient and they consent to proceed.   2. Personal history of tubulovillous adenomatous colon polyps. Colonoscopy as above. Given his age and comorbidities as outlined above, will not plan for any future surveillance screening colonoscopies after this colonoscopy.  3. Obstructive sleep apnea.  4. Coronary artery disease status post MI.  5. Coumadin anticoagulation for DVT. The risks, benefits and alternatives to five-day hold of Coumadin were discussed with the patient and he consents to proceed. Will obtain clearance from Dr. Clelia Croft.

## 2011-04-25 NOTE — Patient Instructions (Addendum)
You have been scheduled for a Colonoscopy with propofol. See separate instructions.  Pick up your prep kit from your pharmacy.  You will be contaced by our office prior to your procedure for directions on holding your Coumadin/Warfarin.  If you do not hear from our office 1 week prior to your scheduled procedure, please call [phone] to discuss. cc: Buren Kos, MD

## 2011-04-26 ENCOUNTER — Encounter: Payer: Self-pay | Admitting: Gastroenterology

## 2011-05-09 ENCOUNTER — Telehealth: Payer: Self-pay

## 2011-05-09 NOTE — Telephone Encounter (Signed)
Asked Cala Bradford (Dr. Alver Fisher nurse) at [facility] to clarify the anticoagulant fax that we received this morning on Alejandro Russell. Cala Bradford states that [name] came in to have his PT INR checked today and was told to hold coumadin for now and start Lovenox bridge tomorrow and was instructed on how to give himself SQ bid injections. He was also instructed on when to resume hi coumadin after his procedure. Left a message with patient to verify this with him.

## 2011-05-17 ENCOUNTER — Other Ambulatory Visit: Payer: Self-pay | Admitting: Gastroenterology

## 2011-05-17 ENCOUNTER — Encounter: Payer: Self-pay | Admitting: Gastroenterology

## 2011-05-17 ENCOUNTER — Ambulatory Visit (AMBULATORY_SURGERY_CENTER): Payer: 59 | Admitting: Gastroenterology

## 2011-05-17 DIAGNOSIS — Z1211 Encounter for screening for malignant neoplasm of colon: Secondary | ICD-10-CM

## 2011-05-17 DIAGNOSIS — D126 Benign neoplasm of colon, unspecified: Secondary | ICD-10-CM

## 2011-05-17 DIAGNOSIS — Z86010 Personal history of colonic polyps: Secondary | ICD-10-CM

## 2011-05-17 DIAGNOSIS — R198 Other specified symptoms and signs involving the digestive system and abdomen: Secondary | ICD-10-CM

## 2011-05-17 LAB — GLUCOSE, CAPILLARY: Glucose-Capillary: 92 mg/dL (ref 70–99)

## 2011-05-17 MED ORDER — SODIUM CHLORIDE 0.9 % IV SOLN: 500.0000 mL | INTRAVENOUS | Status: DC

## 2011-05-17 NOTE — Op Note (Signed)
[facility] [address] [place], [place]  [address]  COLONOSCOPY PROCEDURE REPORT  PATIENT:  Alejandro, Russell  MR#:  [id number] BIRTHDATE:  Jan 05, 1928, 83 yrs. old  GENDER:  male ENDOSCOPIST:  Judie Petit T. Russella Dar, MD, [facility]  PROCEDURE DATE:  05/17/2011 PROCEDURE:  Colonoscopy with snare polypectomy ASA CLASS:  Class III INDICATIONS:  1) surveillance and high-risk screening  2) change in bowel habits  3) history of pre-cancerous (adenomatous) colon polyps: TVA 1995. MEDICATIONS:   MAC sedation, administered by CRNA, propofol (Diprivan) 150 mg IV, robinul 0.2 mg IV DESCRIPTION OF PROCEDURE:   After the risks benefits and alternatives of the procedure were thoroughly explained, informed consent was obtained.  Digital rectal exam was performed and revealed no abnormalities.   The LB 180AL [id number] endoscope was introduced through the anus and advanced to the cecum, which was identified by both the appendix and ileocecal valve, without limitations.  The quality of the prep was good, using MoviPrep. The instrument was then slowly withdrawn as the colon was fully examined. <<PROCEDUREIMAGES>> FINDINGS:  Moderate diverticulosis was found ascending colon to sigmoid colon.  A sessile polyp was found in the sigmoid colon. It was 5 mm in size. Polyp was snared without cautery. Retrieval was successful. A sessile polyp was found in the rectum. It was 4 mm in size. Polyp was snared without cautery. Retrieval was successful. Otherwise normal colonoscopy without other polyps, masses, vascular ectasias, or inflammatory changes.   Retroflexed views in the rectum revealed internal hemorrhoids, small.  The time to cecum =  7.5  minutes. The scope was then withdrawn (time =  11.33  min) from the patient and the procedure completed.  COMPLICATIONS:  None  ENDOSCOPIC IMPRESSION: 1) Moderate diverticulosis ascending colon to sigmoid colon 2) 5 mm sessile polyp in the sigmoid colon 3) 4 mm  sessile polyp in the rectum 4) Internal hemorrhoids  RECOMMENDATIONS: 1) Hold aspirin, aspirin products, and anti-inflammatory medication for 2 weeks. 2) Await pathology results 3) Resume Coumadin (warfarin) and Lovenox today, contact Dr Clelia Croft regarding duration of Lovenox needed and have your PT/INR checked within 1 week. 4) Given your age, you will not need another colonoscopy for colon cancer screening or polyp surveillance. These types of tests usually stop around the age 65.  Venita Lick. Russella Dar, MD, [facility]  CC:  Kari Baars, MD  n. Rosalie DoctorVenita Lick. Aylla Huffine at 05/17/2011 04:02 PM  Tennis Must, [phone]

## 2011-05-17 NOTE — Patient Instructions (Signed)
Discharge instructions given with verbal understanding. Handouts on polyps,diverticulosis and hemorrhoids given. Resume previous medications. Hold aspirin and aspirin products for 2 weeks.

## 2011-05-17 NOTE — Progress Notes (Signed)
Patient did not experience any of the following events: a burn prior to discharge; a fall within the facility; wrong site/side/patient/procedure/implant event; or a hospital transfer or hospital admission upon discharge from the facility. (G8907) Patient did not have preoperative order for IV antibiotic SSI prophylaxis. (G8918)  

## 2011-05-18 ENCOUNTER — Telehealth: Payer: Self-pay

## 2011-05-18 NOTE — Telephone Encounter (Signed)
Left message on answering machine. 

## 2011-05-23 ENCOUNTER — Encounter: Payer: Self-pay | Admitting: Gastroenterology

## 2011-05-31 DIAGNOSIS — Z7901 Long term (current) use of anticoagulants: Secondary | ICD-10-CM | POA: Diagnosis not present

## 2011-05-31 DIAGNOSIS — I82409 Acute embolism and thrombosis of unspecified deep veins of unspecified lower extremity: Secondary | ICD-10-CM | POA: Diagnosis not present

## 2011-06-15 DIAGNOSIS — Z7901 Long term (current) use of anticoagulants: Secondary | ICD-10-CM | POA: Diagnosis not present

## 2011-06-15 DIAGNOSIS — I82409 Acute embolism and thrombosis of unspecified deep veins of unspecified lower extremity: Secondary | ICD-10-CM | POA: Diagnosis not present

## 2011-06-23 ENCOUNTER — Other Ambulatory Visit: Payer: Self-pay | Admitting: Dermatology

## 2011-06-23 DIAGNOSIS — L408 Other psoriasis: Secondary | ICD-10-CM | POA: Diagnosis not present

## 2011-06-23 DIAGNOSIS — L738 Other specified follicular disorders: Secondary | ICD-10-CM | POA: Diagnosis not present

## 2011-06-23 DIAGNOSIS — L905 Scar conditions and fibrosis of skin: Secondary | ICD-10-CM | POA: Diagnosis not present

## 2011-07-10 DIAGNOSIS — L608 Other nail disorders: Secondary | ICD-10-CM | POA: Diagnosis not present

## 2011-07-13 DIAGNOSIS — I82409 Acute embolism and thrombosis of unspecified deep veins of unspecified lower extremity: Secondary | ICD-10-CM | POA: Diagnosis not present

## 2011-07-13 DIAGNOSIS — Z7901 Long term (current) use of anticoagulants: Secondary | ICD-10-CM | POA: Diagnosis not present

## 2011-08-03 DIAGNOSIS — E1149 Type 2 diabetes mellitus with other diabetic neurological complication: Secondary | ICD-10-CM | POA: Diagnosis not present

## 2011-08-03 DIAGNOSIS — E785 Hyperlipidemia, unspecified: Secondary | ICD-10-CM | POA: Diagnosis not present

## 2011-08-03 DIAGNOSIS — I10 Essential (primary) hypertension: Secondary | ICD-10-CM | POA: Diagnosis not present

## 2011-08-03 DIAGNOSIS — E1129 Type 2 diabetes mellitus with other diabetic kidney complication: Secondary | ICD-10-CM | POA: Diagnosis not present

## 2011-08-03 DIAGNOSIS — E538 Deficiency of other specified B group vitamins: Secondary | ICD-10-CM | POA: Diagnosis not present

## 2011-08-03 DIAGNOSIS — I2510 Atherosclerotic heart disease of native coronary artery without angina pectoris: Secondary | ICD-10-CM | POA: Diagnosis not present

## 2011-08-03 DIAGNOSIS — Z7901 Long term (current) use of anticoagulants: Secondary | ICD-10-CM | POA: Diagnosis not present

## 2011-08-03 DIAGNOSIS — R5381 Other malaise: Secondary | ICD-10-CM | POA: Diagnosis not present

## 2011-08-17 DIAGNOSIS — L821 Other seborrheic keratosis: Secondary | ICD-10-CM | POA: Diagnosis not present

## 2011-08-17 DIAGNOSIS — D239 Other benign neoplasm of skin, unspecified: Secondary | ICD-10-CM | POA: Diagnosis not present

## 2011-08-17 DIAGNOSIS — L570 Actinic keratosis: Secondary | ICD-10-CM | POA: Diagnosis not present

## 2011-08-17 DIAGNOSIS — L219 Seborrheic dermatitis, unspecified: Secondary | ICD-10-CM | POA: Diagnosis not present

## 2011-09-06 DIAGNOSIS — I82409 Acute embolism and thrombosis of unspecified deep veins of unspecified lower extremity: Secondary | ICD-10-CM | POA: Diagnosis not present

## 2011-09-06 DIAGNOSIS — E291 Testicular hypofunction: Secondary | ICD-10-CM | POA: Diagnosis not present

## 2011-09-06 DIAGNOSIS — Z7901 Long term (current) use of anticoagulants: Secondary | ICD-10-CM | POA: Diagnosis not present

## 2011-09-27 DIAGNOSIS — I10 Essential (primary) hypertension: Secondary | ICD-10-CM | POA: Diagnosis not present

## 2011-09-27 DIAGNOSIS — J309 Allergic rhinitis, unspecified: Secondary | ICD-10-CM | POA: Diagnosis not present

## 2011-10-11 DIAGNOSIS — I82409 Acute embolism and thrombosis of unspecified deep veins of unspecified lower extremity: Secondary | ICD-10-CM | POA: Diagnosis not present

## 2011-10-11 DIAGNOSIS — Z7901 Long term (current) use of anticoagulants: Secondary | ICD-10-CM | POA: Diagnosis not present

## 2011-11-15 DIAGNOSIS — L608 Other nail disorders: Secondary | ICD-10-CM | POA: Diagnosis not present

## 2011-11-17 DIAGNOSIS — E538 Deficiency of other specified B group vitamins: Secondary | ICD-10-CM | POA: Diagnosis not present

## 2011-11-17 DIAGNOSIS — I2510 Atherosclerotic heart disease of native coronary artery without angina pectoris: Secondary | ICD-10-CM | POA: Diagnosis not present

## 2011-11-17 DIAGNOSIS — Z7901 Long term (current) use of anticoagulants: Secondary | ICD-10-CM | POA: Diagnosis not present

## 2011-11-17 DIAGNOSIS — E785 Hyperlipidemia, unspecified: Secondary | ICD-10-CM | POA: Diagnosis not present

## 2011-11-17 DIAGNOSIS — E1129 Type 2 diabetes mellitus with other diabetic kidney complication: Secondary | ICD-10-CM | POA: Diagnosis not present

## 2011-11-17 DIAGNOSIS — I10 Essential (primary) hypertension: Secondary | ICD-10-CM | POA: Diagnosis not present

## 2011-11-17 DIAGNOSIS — I252 Old myocardial infarction: Secondary | ICD-10-CM | POA: Diagnosis not present

## 2011-11-17 DIAGNOSIS — E1149 Type 2 diabetes mellitus with other diabetic neurological complication: Secondary | ICD-10-CM | POA: Diagnosis not present

## 2011-11-17 DIAGNOSIS — I82409 Acute embolism and thrombosis of unspecified deep veins of unspecified lower extremity: Secondary | ICD-10-CM | POA: Diagnosis not present

## 2011-12-06 DIAGNOSIS — Z7901 Long term (current) use of anticoagulants: Secondary | ICD-10-CM | POA: Diagnosis not present

## 2011-12-06 DIAGNOSIS — I82409 Acute embolism and thrombosis of unspecified deep veins of unspecified lower extremity: Secondary | ICD-10-CM | POA: Diagnosis not present

## 2011-12-25 DIAGNOSIS — I82409 Acute embolism and thrombosis of unspecified deep veins of unspecified lower extremity: Secondary | ICD-10-CM | POA: Diagnosis not present

## 2011-12-25 DIAGNOSIS — Z7901 Long term (current) use of anticoagulants: Secondary | ICD-10-CM | POA: Diagnosis not present

## 2011-12-25 DIAGNOSIS — E538 Deficiency of other specified B group vitamins: Secondary | ICD-10-CM | POA: Diagnosis not present

## 2011-12-28 DIAGNOSIS — D239 Other benign neoplasm of skin, unspecified: Secondary | ICD-10-CM | POA: Diagnosis not present

## 2011-12-28 DIAGNOSIS — L821 Other seborrheic keratosis: Secondary | ICD-10-CM | POA: Diagnosis not present

## 2011-12-28 DIAGNOSIS — Z85828 Personal history of other malignant neoplasm of skin: Secondary | ICD-10-CM | POA: Diagnosis not present

## 2011-12-28 DIAGNOSIS — L570 Actinic keratosis: Secondary | ICD-10-CM | POA: Diagnosis not present

## 2012-01-24 DIAGNOSIS — Z7901 Long term (current) use of anticoagulants: Secondary | ICD-10-CM | POA: Diagnosis not present

## 2012-01-24 DIAGNOSIS — I82409 Acute embolism and thrombosis of unspecified deep veins of unspecified lower extremity: Secondary | ICD-10-CM | POA: Diagnosis not present

## 2012-01-24 DIAGNOSIS — E538 Deficiency of other specified B group vitamins: Secondary | ICD-10-CM | POA: Diagnosis not present

## 2012-02-16 ENCOUNTER — Ambulatory Visit: Payer: 59 | Admitting: Pulmonary Disease

## 2012-02-21 ENCOUNTER — Ambulatory Visit (INDEPENDENT_AMBULATORY_CARE_PROVIDER_SITE_OTHER): Payer: Medicare Other | Admitting: Pulmonary Disease

## 2012-02-21 ENCOUNTER — Encounter: Payer: Self-pay | Admitting: Pulmonary Disease

## 2012-02-21 VITALS — BP 122/62 | HR 62 | Temp 97.8°F | Ht 71.0 in | Wt 220.2 lb

## 2012-02-21 DIAGNOSIS — G4733 Obstructive sleep apnea (adult) (pediatric): Secondary | ICD-10-CM | POA: Diagnosis not present

## 2012-02-21 NOTE — Assessment & Plan Note (Signed)
The patient is doing well with CPAP at his current setting.  I have asked him to keep up with his supplies, and to work more aggressively on weight reduction.  He is to followup with me in one year if doing well.

## 2012-02-21 NOTE — Progress Notes (Signed)
  Subjective:    Patient ID: Alejandro Russell, male    DOB: 1928/04/13, 78 y.o.   MRN: [id number]  HPI The patient comes in today for followup of his known obstructive sleep apnea.  He is wearing CPAP compliantly, and is having no issues with his mask that or pressure.  He feels that he sleeps well, and is satisfied with his daytime alertness.  His weight is stable since his last visit.   Review of Systems  Constitutional: Negative for fever and unexpected weight change.  HENT: Positive for rhinorrhea. Negative for ear pain, nosebleeds, congestion, sore throat, sneezing, trouble swallowing, dental problem, postnasal drip and sinus pressure.   Eyes: Negative for redness and itching.  Respiratory: Positive for cough and shortness of breath. Negative for chest tightness and wheezing.   Cardiovascular: Negative for palpitations and leg swelling.  Gastrointestinal: Negative for nausea and vomiting.  Genitourinary: Negative for dysuria.  Musculoskeletal: Negative for joint swelling.  Skin: Negative for rash.  Neurological: Negative for headaches.  Hematological: Does not bruise/bleed easily.  Psychiatric/Behavioral: Negative for dysphoric mood. The patient is nervous/anxious.        Objective:   Physical Exam Overweight male in no acute distress Nose without purulence or discharge noted No skin breakdown or pressure necrosis from the CPAP mask Lower extremities with mild edema, no cyanosis Alert, does not appear to be sleepy, moves all 4 extremities.       Assessment & Plan:

## 2012-02-21 NOTE — Patient Instructions (Addendum)
Continue with cpap, keep up with supplies and mask changes Work on weight loss followup with me in one year.  

## 2012-02-27 DIAGNOSIS — E538 Deficiency of other specified B group vitamins: Secondary | ICD-10-CM | POA: Diagnosis not present

## 2012-02-27 DIAGNOSIS — Z23 Encounter for immunization: Secondary | ICD-10-CM | POA: Diagnosis not present

## 2012-02-27 DIAGNOSIS — I82409 Acute embolism and thrombosis of unspecified deep veins of unspecified lower extremity: Secondary | ICD-10-CM | POA: Diagnosis not present

## 2012-02-27 DIAGNOSIS — Z7901 Long term (current) use of anticoagulants: Secondary | ICD-10-CM | POA: Diagnosis not present

## 2012-04-01 DIAGNOSIS — Z125 Encounter for screening for malignant neoplasm of prostate: Secondary | ICD-10-CM | POA: Diagnosis not present

## 2012-04-01 DIAGNOSIS — I2510 Atherosclerotic heart disease of native coronary artery without angina pectoris: Secondary | ICD-10-CM | POA: Diagnosis not present

## 2012-04-01 DIAGNOSIS — E785 Hyperlipidemia, unspecified: Secondary | ICD-10-CM | POA: Diagnosis not present

## 2012-04-01 DIAGNOSIS — I10 Essential (primary) hypertension: Secondary | ICD-10-CM | POA: Diagnosis not present

## 2012-04-01 DIAGNOSIS — E559 Vitamin D deficiency, unspecified: Secondary | ICD-10-CM | POA: Diagnosis not present

## 2012-04-01 DIAGNOSIS — E538 Deficiency of other specified B group vitamins: Secondary | ICD-10-CM | POA: Diagnosis not present

## 2012-04-01 DIAGNOSIS — E1129 Type 2 diabetes mellitus with other diabetic kidney complication: Secondary | ICD-10-CM | POA: Diagnosis not present

## 2012-04-04 ENCOUNTER — Other Ambulatory Visit: Payer: Self-pay | Admitting: Dermatology

## 2012-04-04 DIAGNOSIS — L259 Unspecified contact dermatitis, unspecified cause: Secondary | ICD-10-CM | POA: Diagnosis not present

## 2012-04-04 DIAGNOSIS — L820 Inflamed seborrheic keratosis: Secondary | ICD-10-CM | POA: Diagnosis not present

## 2012-04-04 DIAGNOSIS — L719 Rosacea, unspecified: Secondary | ICD-10-CM | POA: Diagnosis not present

## 2012-04-04 DIAGNOSIS — L738 Other specified follicular disorders: Secondary | ICD-10-CM | POA: Diagnosis not present

## 2012-04-04 DIAGNOSIS — L739 Follicular disorder, unspecified: Secondary | ICD-10-CM | POA: Diagnosis not present

## 2012-04-04 DIAGNOSIS — D239 Other benign neoplasm of skin, unspecified: Secondary | ICD-10-CM | POA: Diagnosis not present

## 2012-04-04 DIAGNOSIS — Z85828 Personal history of other malignant neoplasm of skin: Secondary | ICD-10-CM | POA: Diagnosis not present

## 2012-04-04 DIAGNOSIS — L821 Other seborrheic keratosis: Secondary | ICD-10-CM | POA: Diagnosis not present

## 2012-04-04 DIAGNOSIS — L570 Actinic keratosis: Secondary | ICD-10-CM | POA: Diagnosis not present

## 2012-04-04 DIAGNOSIS — D230 Other benign neoplasm of skin of lip: Secondary | ICD-10-CM | POA: Diagnosis not present

## 2012-04-08 DIAGNOSIS — N182 Chronic kidney disease, stage 2 (mild): Secondary | ICD-10-CM | POA: Diagnosis not present

## 2012-04-08 DIAGNOSIS — E1129 Type 2 diabetes mellitus with other diabetic kidney complication: Secondary | ICD-10-CM | POA: Diagnosis not present

## 2012-04-08 DIAGNOSIS — I82409 Acute embolism and thrombosis of unspecified deep veins of unspecified lower extremity: Secondary | ICD-10-CM | POA: Diagnosis not present

## 2012-04-08 DIAGNOSIS — Z0000 Encounter for general adult medical examination without abnormal findings: Secondary | ICD-10-CM | POA: Diagnosis not present

## 2012-04-08 DIAGNOSIS — Z1212 Encounter for screening for malignant neoplasm of rectum: Secondary | ICD-10-CM | POA: Diagnosis not present

## 2012-04-08 DIAGNOSIS — I10 Essential (primary) hypertension: Secondary | ICD-10-CM | POA: Diagnosis not present

## 2012-04-08 DIAGNOSIS — E538 Deficiency of other specified B group vitamins: Secondary | ICD-10-CM | POA: Diagnosis not present

## 2012-04-08 DIAGNOSIS — I2510 Atherosclerotic heart disease of native coronary artery without angina pectoris: Secondary | ICD-10-CM | POA: Diagnosis not present

## 2012-04-18 DIAGNOSIS — B351 Tinea unguium: Secondary | ICD-10-CM | POA: Diagnosis not present

## 2012-05-02 DIAGNOSIS — I82409 Acute embolism and thrombosis of unspecified deep veins of unspecified lower extremity: Secondary | ICD-10-CM | POA: Diagnosis not present

## 2012-05-02 DIAGNOSIS — Z7901 Long term (current) use of anticoagulants: Secondary | ICD-10-CM | POA: Diagnosis not present

## 2012-05-02 DIAGNOSIS — E538 Deficiency of other specified B group vitamins: Secondary | ICD-10-CM | POA: Diagnosis not present

## 2012-05-28 DIAGNOSIS — I10 Essential (primary) hypertension: Secondary | ICD-10-CM | POA: Diagnosis not present

## 2012-05-28 DIAGNOSIS — J069 Acute upper respiratory infection, unspecified: Secondary | ICD-10-CM | POA: Diagnosis not present

## 2012-05-28 DIAGNOSIS — R05 Cough: Secondary | ICD-10-CM | POA: Diagnosis not present

## 2012-06-03 DIAGNOSIS — Z7901 Long term (current) use of anticoagulants: Secondary | ICD-10-CM | POA: Diagnosis not present

## 2012-06-03 DIAGNOSIS — E538 Deficiency of other specified B group vitamins: Secondary | ICD-10-CM | POA: Diagnosis not present

## 2012-06-03 DIAGNOSIS — I82409 Acute embolism and thrombosis of unspecified deep veins of unspecified lower extremity: Secondary | ICD-10-CM | POA: Diagnosis not present

## 2012-07-05 DIAGNOSIS — E538 Deficiency of other specified B group vitamins: Secondary | ICD-10-CM | POA: Diagnosis not present

## 2012-07-05 DIAGNOSIS — I82409 Acute embolism and thrombosis of unspecified deep veins of unspecified lower extremity: Secondary | ICD-10-CM | POA: Diagnosis not present

## 2012-07-05 DIAGNOSIS — I2510 Atherosclerotic heart disease of native coronary artery without angina pectoris: Secondary | ICD-10-CM | POA: Diagnosis not present

## 2012-07-05 DIAGNOSIS — Z7901 Long term (current) use of anticoagulants: Secondary | ICD-10-CM | POA: Diagnosis not present

## 2012-07-18 DIAGNOSIS — B351 Tinea unguium: Secondary | ICD-10-CM | POA: Diagnosis not present

## 2012-07-18 DIAGNOSIS — M722 Plantar fascial fibromatosis: Secondary | ICD-10-CM | POA: Diagnosis not present

## 2012-07-25 DIAGNOSIS — E538 Deficiency of other specified B group vitamins: Secondary | ICD-10-CM | POA: Diagnosis not present

## 2012-07-25 DIAGNOSIS — I82409 Acute embolism and thrombosis of unspecified deep veins of unspecified lower extremity: Secondary | ICD-10-CM | POA: Diagnosis not present

## 2012-07-25 DIAGNOSIS — Z7901 Long term (current) use of anticoagulants: Secondary | ICD-10-CM | POA: Diagnosis not present

## 2012-07-29 DIAGNOSIS — H1030 Unspecified acute conjunctivitis, unspecified eye: Secondary | ICD-10-CM | POA: Diagnosis not present

## 2012-08-06 DIAGNOSIS — E1129 Type 2 diabetes mellitus with other diabetic kidney complication: Secondary | ICD-10-CM | POA: Diagnosis not present

## 2012-08-06 DIAGNOSIS — N182 Chronic kidney disease, stage 2 (mild): Secondary | ICD-10-CM | POA: Diagnosis not present

## 2012-08-06 DIAGNOSIS — E1149 Type 2 diabetes mellitus with other diabetic neurological complication: Secondary | ICD-10-CM | POA: Diagnosis not present

## 2012-08-06 DIAGNOSIS — G609 Hereditary and idiopathic neuropathy, unspecified: Secondary | ICD-10-CM | POA: Diagnosis not present

## 2012-08-15 DIAGNOSIS — E538 Deficiency of other specified B group vitamins: Secondary | ICD-10-CM | POA: Diagnosis not present

## 2012-08-15 DIAGNOSIS — I82409 Acute embolism and thrombosis of unspecified deep veins of unspecified lower extremity: Secondary | ICD-10-CM | POA: Diagnosis not present

## 2012-08-15 DIAGNOSIS — Z7901 Long term (current) use of anticoagulants: Secondary | ICD-10-CM | POA: Diagnosis not present

## 2012-08-29 DIAGNOSIS — L738 Other specified follicular disorders: Secondary | ICD-10-CM | POA: Diagnosis not present

## 2012-08-29 DIAGNOSIS — L719 Rosacea, unspecified: Secondary | ICD-10-CM | POA: Diagnosis not present

## 2012-08-29 DIAGNOSIS — Z85828 Personal history of other malignant neoplasm of skin: Secondary | ICD-10-CM | POA: Diagnosis not present

## 2012-08-29 DIAGNOSIS — Z85820 Personal history of malignant melanoma of skin: Secondary | ICD-10-CM | POA: Diagnosis not present

## 2012-08-29 DIAGNOSIS — L408 Other psoriasis: Secondary | ICD-10-CM | POA: Diagnosis not present

## 2012-09-18 DIAGNOSIS — Z7901 Long term (current) use of anticoagulants: Secondary | ICD-10-CM | POA: Diagnosis not present

## 2012-09-18 DIAGNOSIS — E538 Deficiency of other specified B group vitamins: Secondary | ICD-10-CM | POA: Diagnosis not present

## 2012-09-18 DIAGNOSIS — I82409 Acute embolism and thrombosis of unspecified deep veins of unspecified lower extremity: Secondary | ICD-10-CM | POA: Diagnosis not present

## 2012-10-17 DIAGNOSIS — B351 Tinea unguium: Secondary | ICD-10-CM | POA: Diagnosis not present

## 2012-10-25 DIAGNOSIS — Z7901 Long term (current) use of anticoagulants: Secondary | ICD-10-CM | POA: Diagnosis not present

## 2012-10-25 DIAGNOSIS — I82409 Acute embolism and thrombosis of unspecified deep veins of unspecified lower extremity: Secondary | ICD-10-CM | POA: Diagnosis not present

## 2012-12-06 DIAGNOSIS — E1149 Type 2 diabetes mellitus with other diabetic neurological complication: Secondary | ICD-10-CM | POA: Diagnosis not present

## 2012-12-06 DIAGNOSIS — E1129 Type 2 diabetes mellitus with other diabetic kidney complication: Secondary | ICD-10-CM | POA: Diagnosis not present

## 2012-12-06 DIAGNOSIS — I10 Essential (primary) hypertension: Secondary | ICD-10-CM | POA: Diagnosis not present

## 2012-12-06 DIAGNOSIS — E538 Deficiency of other specified B group vitamins: Secondary | ICD-10-CM | POA: Diagnosis not present

## 2012-12-06 DIAGNOSIS — E785 Hyperlipidemia, unspecified: Secondary | ICD-10-CM | POA: Diagnosis not present

## 2012-12-06 DIAGNOSIS — N182 Chronic kidney disease, stage 2 (mild): Secondary | ICD-10-CM | POA: Diagnosis not present

## 2012-12-06 DIAGNOSIS — Z7901 Long term (current) use of anticoagulants: Secondary | ICD-10-CM | POA: Diagnosis not present

## 2012-12-06 DIAGNOSIS — I2510 Atherosclerotic heart disease of native coronary artery without angina pectoris: Secondary | ICD-10-CM | POA: Diagnosis not present

## 2012-12-11 DIAGNOSIS — H43819 Vitreous degeneration, unspecified eye: Secondary | ICD-10-CM | POA: Diagnosis not present

## 2013-01-09 DIAGNOSIS — B351 Tinea unguium: Secondary | ICD-10-CM | POA: Diagnosis not present

## 2013-02-06 DIAGNOSIS — Z7901 Long term (current) use of anticoagulants: Secondary | ICD-10-CM | POA: Diagnosis not present

## 2013-02-06 DIAGNOSIS — I82409 Acute embolism and thrombosis of unspecified deep veins of unspecified lower extremity: Secondary | ICD-10-CM | POA: Diagnosis not present

## 2013-02-06 DIAGNOSIS — E538 Deficiency of other specified B group vitamins: Secondary | ICD-10-CM | POA: Diagnosis not present

## 2013-02-19 ENCOUNTER — Encounter: Payer: Self-pay | Admitting: Pulmonary Disease

## 2013-02-19 ENCOUNTER — Ambulatory Visit (INDEPENDENT_AMBULATORY_CARE_PROVIDER_SITE_OTHER): Payer: Medicare Other | Admitting: Pulmonary Disease

## 2013-02-19 VITALS — BP 124/60 | HR 64 | Temp 97.9°F | Ht 71.0 in | Wt 212.6 lb

## 2013-02-19 DIAGNOSIS — G4733 Obstructive sleep apnea (adult) (pediatric): Secondary | ICD-10-CM | POA: Diagnosis not present

## 2013-02-19 NOTE — Patient Instructions (Addendum)
Continue with cpap, and work on modest weight loss.  Keep up with mask changes and supplies. followup with me in one year if doing well.

## 2013-02-19 NOTE — Progress Notes (Signed)
  Subjective:    Patient ID: Alejandro Russell, male    DOB: 1927-09-27, 79 y.o.   MRN: [id number]  HPI The patient comes in today for followup of his obstructive sleep apnea.  He is wearing CPAP compliantly, and is having no issues with his mask fit or pressure.  He is overdue for a new mask.  He feels that he is sleeping well with the device, and is having no issues with his daytime alertness.   Review of Systems  Constitutional: Negative for fever and unexpected weight change.  HENT: Negative for ear pain, nosebleeds, congestion, sore throat, rhinorrhea, sneezing, trouble swallowing, dental problem, postnasal drip and sinus pressure.   Eyes: Negative for redness and itching.  Respiratory: Negative for cough, chest tightness, shortness of breath and wheezing.   Cardiovascular: Negative for palpitations and leg swelling.  Gastrointestinal: Negative for nausea and vomiting.  Genitourinary: Negative for dysuria.  Musculoskeletal: Negative for joint swelling.  Skin: Negative for rash.  Neurological: Negative for headaches.  Hematological: Does not bruise/bleed easily.  Psychiatric/Behavioral: Negative for dysphoric mood. The patient is not nervous/anxious.        Objective:   Physical Exam Overweight male in no acute distress Nose without purulence or discharge noted No skin breakdown or pressure necrosis from the CPAP mask Oropharynx clear  Neck without lymphadenopathy or thyromegaly Lower extremities with no significant edema, no cyanosis Alert, does not appear to be sleepy, moves all 4 extremities.       Assessment & Plan:

## 2013-02-19 NOTE — Assessment & Plan Note (Signed)
The patient is doing very well on his current CPAP setup.  He is overdue for a new mask, and I've encouraged him to keep up with his supplies on a more regular basis.  I have also encouraged him to work on weight loss, and we'll see him back in one year.

## 2013-02-20 DIAGNOSIS — Z23 Encounter for immunization: Secondary | ICD-10-CM | POA: Diagnosis not present

## 2013-02-28 DIAGNOSIS — J069 Acute upper respiratory infection, unspecified: Secondary | ICD-10-CM | POA: Diagnosis not present

## 2013-02-28 DIAGNOSIS — Z6829 Body mass index (BMI) 29.0-29.9, adult: Secondary | ICD-10-CM | POA: Diagnosis not present

## 2013-03-11 DIAGNOSIS — Z7901 Long term (current) use of anticoagulants: Secondary | ICD-10-CM | POA: Diagnosis not present

## 2013-03-11 DIAGNOSIS — E538 Deficiency of other specified B group vitamins: Secondary | ICD-10-CM | POA: Diagnosis not present

## 2013-03-11 DIAGNOSIS — I82409 Acute embolism and thrombosis of unspecified deep veins of unspecified lower extremity: Secondary | ICD-10-CM | POA: Diagnosis not present

## 2013-04-03 ENCOUNTER — Other Ambulatory Visit: Payer: Self-pay | Admitting: Dermatology

## 2013-04-03 DIAGNOSIS — L570 Actinic keratosis: Secondary | ICD-10-CM | POA: Diagnosis not present

## 2013-04-03 DIAGNOSIS — D230 Other benign neoplasm of skin of lip: Secondary | ICD-10-CM | POA: Diagnosis not present

## 2013-04-03 DIAGNOSIS — Z85828 Personal history of other malignant neoplasm of skin: Secondary | ICD-10-CM | POA: Diagnosis not present

## 2013-04-03 DIAGNOSIS — D239 Other benign neoplasm of skin, unspecified: Secondary | ICD-10-CM | POA: Diagnosis not present

## 2013-04-03 DIAGNOSIS — L821 Other seborrheic keratosis: Secondary | ICD-10-CM | POA: Diagnosis not present

## 2013-04-03 DIAGNOSIS — Z85820 Personal history of malignant melanoma of skin: Secondary | ICD-10-CM | POA: Diagnosis not present

## 2013-04-03 DIAGNOSIS — D485 Neoplasm of uncertain behavior of skin: Secondary | ICD-10-CM | POA: Diagnosis not present

## 2013-04-03 DIAGNOSIS — L819 Disorder of pigmentation, unspecified: Secondary | ICD-10-CM | POA: Diagnosis not present

## 2013-04-08 DIAGNOSIS — J069 Acute upper respiratory infection, unspecified: Secondary | ICD-10-CM | POA: Diagnosis not present

## 2013-04-10 ENCOUNTER — Ambulatory Visit: Payer: Self-pay | Admitting: Podiatry

## 2013-04-15 DIAGNOSIS — Z7901 Long term (current) use of anticoagulants: Secondary | ICD-10-CM | POA: Diagnosis not present

## 2013-04-15 DIAGNOSIS — Z125 Encounter for screening for malignant neoplasm of prostate: Secondary | ICD-10-CM | POA: Diagnosis not present

## 2013-04-15 DIAGNOSIS — N182 Chronic kidney disease, stage 2 (mild): Secondary | ICD-10-CM | POA: Diagnosis not present

## 2013-04-15 DIAGNOSIS — E559 Vitamin D deficiency, unspecified: Secondary | ICD-10-CM | POA: Diagnosis not present

## 2013-04-15 DIAGNOSIS — E1129 Type 2 diabetes mellitus with other diabetic kidney complication: Secondary | ICD-10-CM | POA: Diagnosis not present

## 2013-04-15 DIAGNOSIS — E785 Hyperlipidemia, unspecified: Secondary | ICD-10-CM | POA: Diagnosis not present

## 2013-04-22 DIAGNOSIS — N182 Chronic kidney disease, stage 2 (mild): Secondary | ICD-10-CM | POA: Diagnosis not present

## 2013-04-22 DIAGNOSIS — E538 Deficiency of other specified B group vitamins: Secondary | ICD-10-CM | POA: Diagnosis not present

## 2013-04-22 DIAGNOSIS — E1149 Type 2 diabetes mellitus with other diabetic neurological complication: Secondary | ICD-10-CM | POA: Diagnosis not present

## 2013-04-22 DIAGNOSIS — I10 Essential (primary) hypertension: Secondary | ICD-10-CM | POA: Diagnosis not present

## 2013-04-22 DIAGNOSIS — E1129 Type 2 diabetes mellitus with other diabetic kidney complication: Secondary | ICD-10-CM | POA: Diagnosis not present

## 2013-04-22 DIAGNOSIS — Z87891 Personal history of nicotine dependence: Secondary | ICD-10-CM | POA: Diagnosis not present

## 2013-04-22 DIAGNOSIS — Z0000 Encounter for general adult medical examination without abnormal findings: Secondary | ICD-10-CM | POA: Diagnosis not present

## 2013-04-22 DIAGNOSIS — D649 Anemia, unspecified: Secondary | ICD-10-CM | POA: Diagnosis not present

## 2013-04-22 DIAGNOSIS — Z23 Encounter for immunization: Secondary | ICD-10-CM | POA: Diagnosis not present

## 2013-04-28 ENCOUNTER — Encounter: Payer: Self-pay | Admitting: Podiatry

## 2013-04-28 ENCOUNTER — Ambulatory Visit (INDEPENDENT_AMBULATORY_CARE_PROVIDER_SITE_OTHER): Payer: 59 | Admitting: Podiatry

## 2013-04-28 VITALS — BP 138/79 | HR 86 | Resp 12

## 2013-04-28 DIAGNOSIS — B351 Tinea unguium: Secondary | ICD-10-CM

## 2013-04-28 NOTE — Progress Notes (Signed)
Subjective:     Patient ID: Alejandro Russell, male   DOB: 06/18/1927, 79 y.o.   MRN: [id number]  HPI patient states any my nails cut on both feet   Review of Systems     Objective:   Physical Exam Neurovascular status intact with thick nails disease with no pain 1-5 of both feet    Assessment:     Mycotic nail infection 1-5 both feet with thickness of that    Plan:     Debridement of nailbeds 1-5 both feet with no iatrogenic bleeding noted

## 2013-05-14 DIAGNOSIS — Z1212 Encounter for screening for malignant neoplasm of rectum: Secondary | ICD-10-CM | POA: Diagnosis not present

## 2013-05-14 DIAGNOSIS — Z23 Encounter for immunization: Secondary | ICD-10-CM | POA: Diagnosis not present

## 2013-05-27 DIAGNOSIS — I82409 Acute embolism and thrombosis of unspecified deep veins of unspecified lower extremity: Secondary | ICD-10-CM | POA: Diagnosis not present

## 2013-05-27 DIAGNOSIS — E538 Deficiency of other specified B group vitamins: Secondary | ICD-10-CM | POA: Diagnosis not present

## 2013-05-27 DIAGNOSIS — Z7901 Long term (current) use of anticoagulants: Secondary | ICD-10-CM | POA: Diagnosis not present

## 2013-07-09 DIAGNOSIS — Z7901 Long term (current) use of anticoagulants: Secondary | ICD-10-CM | POA: Diagnosis not present

## 2013-07-09 DIAGNOSIS — E538 Deficiency of other specified B group vitamins: Secondary | ICD-10-CM | POA: Diagnosis not present

## 2013-07-09 DIAGNOSIS — I82409 Acute embolism and thrombosis of unspecified deep veins of unspecified lower extremity: Secondary | ICD-10-CM | POA: Diagnosis not present

## 2013-07-28 ENCOUNTER — Ambulatory Visit: Payer: 59 | Admitting: Podiatry

## 2013-07-31 ENCOUNTER — Ambulatory Visit: Payer: 59 | Admitting: Podiatry

## 2013-08-20 DIAGNOSIS — E559 Vitamin D deficiency, unspecified: Secondary | ICD-10-CM | POA: Diagnosis not present

## 2013-08-20 DIAGNOSIS — D649 Anemia, unspecified: Secondary | ICD-10-CM | POA: Diagnosis not present

## 2013-08-20 DIAGNOSIS — I82409 Acute embolism and thrombosis of unspecified deep veins of unspecified lower extremity: Secondary | ICD-10-CM | POA: Diagnosis not present

## 2013-08-20 DIAGNOSIS — E538 Deficiency of other specified B group vitamins: Secondary | ICD-10-CM | POA: Diagnosis not present

## 2013-08-20 DIAGNOSIS — I10 Essential (primary) hypertension: Secondary | ICD-10-CM | POA: Diagnosis not present

## 2013-08-20 DIAGNOSIS — E1129 Type 2 diabetes mellitus with other diabetic kidney complication: Secondary | ICD-10-CM | POA: Diagnosis not present

## 2013-08-20 DIAGNOSIS — Z7901 Long term (current) use of anticoagulants: Secondary | ICD-10-CM | POA: Diagnosis not present

## 2013-08-20 DIAGNOSIS — E785 Hyperlipidemia, unspecified: Secondary | ICD-10-CM | POA: Diagnosis not present

## 2013-09-02 ENCOUNTER — Other Ambulatory Visit (HOSPITAL_COMMUNITY): Payer: Self-pay | Admitting: Internal Medicine

## 2013-09-02 ENCOUNTER — Encounter (HOSPITAL_COMMUNITY): Payer: Self-pay

## 2013-09-02 ENCOUNTER — Encounter (HOSPITAL_COMMUNITY)
Admission: RE | Admit: 2013-09-02 | Discharge: 2013-09-02 | Disposition: A | Discharge status: Home / Self Care | Payer: Medicare Other | Source: Ambulatory Visit | Attending: Internal Medicine | Admitting: Internal Medicine

## 2013-09-02 DIAGNOSIS — D649 Anemia, unspecified: Secondary | ICD-10-CM | POA: Diagnosis not present

## 2013-09-02 HISTORY — DX: Anemia, unspecified: D64.9

## 2013-09-02 MED ORDER — SODIUM CHLORIDE 0.9 % IV SOLN
Freq: Once | INTRAVENOUS | Status: AC
  Administered 2013-09-02: 250 mL via INTRAVENOUS

## 2013-09-02 MED ORDER — SODIUM CHLORIDE 0.9 % IV SOLN
1020.0000 mg | Freq: Once | INTRAVENOUS | Status: AC
  Administered 2013-09-02: 1020 mg via INTRAVENOUS
  Filled 2013-09-02: qty 34

## 2013-09-02 NOTE — Discharge Instructions (Signed)

## 2013-09-03 ENCOUNTER — Encounter: Payer: Self-pay | Admitting: Physician Assistant

## 2013-09-03 ENCOUNTER — Telehealth: Payer: Self-pay | Admitting: *Deleted

## 2013-09-03 ENCOUNTER — Ambulatory Visit (INDEPENDENT_AMBULATORY_CARE_PROVIDER_SITE_OTHER): Payer: Medicare Other | Admitting: Physician Assistant

## 2013-09-03 VITALS — BP 116/56 | HR 72 | Ht 70.5 in | Wt 216.0 lb

## 2013-09-03 DIAGNOSIS — N189 Chronic kidney disease, unspecified: Secondary | ICD-10-CM | POA: Diagnosis not present

## 2013-09-03 DIAGNOSIS — I25810 Atherosclerosis of coronary artery bypass graft(s) without angina pectoris: Secondary | ICD-10-CM

## 2013-09-03 DIAGNOSIS — Z7901 Long term (current) use of anticoagulants: Secondary | ICD-10-CM

## 2013-09-03 DIAGNOSIS — Z1211 Encounter for screening for malignant neoplasm of colon: Secondary | ICD-10-CM | POA: Diagnosis not present

## 2013-09-03 DIAGNOSIS — D500 Iron deficiency anemia secondary to blood loss (chronic): Secondary | ICD-10-CM

## 2013-09-03 DIAGNOSIS — Z86010 Personal history of colonic polyps: Secondary | ICD-10-CM

## 2013-09-03 DIAGNOSIS — Z860101 Personal history of adenomatous and serrated colon polyps: Secondary | ICD-10-CM | POA: Insufficient documentation

## 2013-09-03 NOTE — Progress Notes (Signed)
Reviewed and agree with management plan.  Derwood Becraft T. Cherissa Hook, MD FACG 

## 2013-09-03 NOTE — Progress Notes (Signed)
Subjective:    Patient ID: Alejandro Russell, male    DOB: 1928-05-01, 80 y.o.   MRN: [id number]  HPI  [name] is a pleasant 80 year old white male known previously to Dr. Fuller Plan from colonoscopies. He has history of adenomatous colon polyps. Last colonoscopy was done in December of 2012 and he had 2 sessile polyps removed both were tubular adenomas also noted to have internal hemorrhoids and moderate diverticulosis. No followup scheduled due to his age. 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 He is referred today per Dr. Brigitte Pulse with a worsening anemia and iron deficiency. He was noted to have a hemoglobin of 8.6 on 09/02/2013 MCV of 73 and a ferritin of 7. Apparently he was supposed to have been on an iron supplement prior to that time but his wife does not think he was taking it. Nevertheless he did get an iron infusion yesterday which she tolerated well.  Patient has no complaints of dysphagia, odynophagia ,heartburn, indigestion, nausea, vomiting or abdominal pain, no changes in his bowel habits, melena or hematochezia. He does have problems with mild chronic constipation and had been using a stool softener. He says he may go several days between bowel movements. No prior EGD. No regular NSAID use, he is on a baby aspirin in addition to Coumadin. Patient's only complaint is of increased fatigue over the past few months.    Review of Systems  Constitutional: Positive for fatigue.  HENT: Negative.   Eyes: Negative.   Respiratory: Negative.   Cardiovascular: Negative.   Gastrointestinal: Negative.   Endocrine: Negative.   Genitourinary: Negative.   Musculoskeletal: Positive for arthralgias and gait problem.  Skin: Negative.   Allergic/Immunologic: Negative.   Neurological: Negative.   Hematological: Negative.     Psychiatric/Behavioral: Negative.    Outpatient Prescriptions Prior to Visit  Medication Sig Dispense Refill  . aspirin 81 MG tablet Take 81 mg by mouth daily.        . clobetasol (TEMOVATE) 0.05 % external solution Apply topically as needed.        . Cyanocobalamin 1000 MCG/15ML LIQD Take by mouth. 1 injection monthly, Dr Jacquelynn Cree office.       . Enoxaparin Sodium (LOVENOX IJ) Inject as directed. Taking as a bridge since off Coumadin for this procedure      . fenofibrate (TRICOR) 145 MG tablet Take 145 mg by mouth daily.        . folic acid (FOLVITE) 1 MG tablet Take 1 mg by mouth daily.        . isosorbide mononitrate (IMDUR) 60 MG 24 hr tablet Take 60 mg by mouth daily.        . metFORMIN (GLUCOPHAGE) 500 MG tablet Take 500 mg by mouth 2 (two) times daily.        . metoprolol (LOPRESSOR) 100 MG tablet Take 50 mg by mouth 2 (two) times daily.        . niacin (NIASPAN) 1000 MG CR tablet Take 1,000 mg by mouth at bedtime.        Marland Kitchen PARoxetine (PAXIL) 20 MG tablet Take 20 mg by mouth daily.        . ramipril (ALTACE) 2.5 MG capsule Take 2.5 mg by mouth daily.        . rosuvastatin (CRESTOR) 20 MG tablet Take 20 mg by mouth daily.        Marland Kitchen  warfarin (COUMADIN) 3 MG tablet Take 3 mg by mouth as directed. M,W,F 1.5 mg / T TH, S, Sun 3 MG.       No facility-administered medications prior to visit.   No Known Allergies Patient Active Problem List   Diagnosis Date Noted  . CRI (chronic renal insufficiency) 09/03/2013  . Hx of adenomatous colonic polyps 09/03/2013  . CAD (coronary artery disease) of artery bypass graft 09/03/2013  . DVT 11/01/2009  . DIABETES MELLITUS, TYPE II 10/29/2009  . HYPERLIPIDEMIA 10/29/2009  . ANXIETY 10/29/2009  . OBSTRUCTIVE SLEEP APNEA 10/29/2009  . HYPERTENSION 10/29/2009  . DIVERTICULOSIS, COLON 10/29/2009   History  Substance Use Topics  . Smoking status: Former Smoker -- 2.00 packs/day for 31 years    Types: Cigarettes    Quit date: 05/29/1978  .  Smokeless tobacco: Never Used  . Alcohol Use: No     Comment: Hx of alcohol abuse   family history includes Dementia in his father; Emphysema in his mother. There is no history of Colon cancer, Esophageal cancer, or Stomach cancer.     Objective:   Physical Exam well-developed elderly white male in no acute distress, accompanied by his wife blood pressure 116/56 pulse 72 height 5 foot 10 weight 216. HEENT; nontraumatic normocephalic EOMI PERRLA sclera anicteric, Supple; no JVD, Cardiovascular ;regular rate and rhythm with S1-S2 no murmur or gallop, Pulmonary; clear bilaterally, Abdomen; large soft nontender nondistended bowel sounds are active there is no palpable mass or hepatosplenomegaly, Rectal; exam no external lesion noted very scant stool on examining glove trace heme positive, Extremities; no clubbing cyanosis or edema skin warm and dry, Psych; mood and affect appropriate        Assessment & Plan:  #35 80 year old male with new progressive iron deficiency anemia, in setting of chronic anticoagulation with Coumadin. No overt bleeding stool is trace heme positive today and patient asymptomatic with exception of fatigue. Rule out occult upper or lower GI lesion, neoplasm or AVMs. Will also  need to consider small bowel etiology. #2 chronic anticoagulation with Coumadin #3 history of recurrent DVT #4 coronary artery disease status post MI and prior angioplasty #5 history of chronic renal insufficiency #6 history of adenomatous colon polyps last colonoscopy 2012 #7 hypertension #8 adult-onset diabetes mellitus #9 sleep apnea  Plan;Patient received an iron infusion yesterday Will schedule for colonoscopy and EGD with Dr. Josiah Lobo discussed in detail with patient and his wife and they are agreeable to proceed. We also discussed the possibility of capsule endoscopy if endoscopic evaluation is negative We'll obtain consent from Dr. Brigitte Pulse for patient to hold Coumadin for 5 days prior  to the procedure He will need serial CBCs done by primary care.

## 2013-09-03 NOTE — Patient Instructions (Signed)
We will call you once we hear from Dr. Brigitte Pulse regarding the coumadin.  You have been scheduled for a colonoscopy with propofol. Please follow written instructions given to you at your visit today.  We have given you a sample for the colonoscopy prep. If you use inhalers (even only as needed), please bring them with you on the day of your procedure.

## 2013-09-03 NOTE — Telephone Encounter (Signed)
Spoke to [facility] and she will tell someone there that takes care of the coumadin. That person will call be back today. I did suggest if the patient is to hold the coumadin he will have to start holding it tomorrow 09-04-2013.

## 2013-09-03 NOTE — Telephone Encounter (Signed)
09/03/2013   RE: Alejandro Russell DOB: August 27, 1927 MRN: [id number]   Dear Dr. Marton Redwood,    We have scheduled the above patient for an endoscopic procedure. Our records show that he is on anticoagulation therapy.   Please advise as to how long the patient may come off his therapy of Coumadin prior to the procedure, which is scheduled for 09-09-2013.  Please fax back/ or route the completed form to [facility] at [phone].   Sincerely,    Amy Esterwood PA-C    [place] [facility]

## 2013-09-04 ENCOUNTER — Telehealth: Payer: Self-pay | Admitting: Physician Assistant

## 2013-09-04 ENCOUNTER — Encounter (HOSPITAL_COMMUNITY): Payer: Self-pay | Admitting: *Deleted

## 2013-09-04 ENCOUNTER — Encounter (HOSPITAL_COMMUNITY): Payer: Self-pay | Admitting: Pharmacy Technician

## 2013-09-04 DIAGNOSIS — Z7901 Long term (current) use of anticoagulants: Secondary | ICD-10-CM | POA: Diagnosis not present

## 2013-09-04 DIAGNOSIS — D649 Anemia, unspecified: Secondary | ICD-10-CM | POA: Diagnosis not present

## 2013-09-04 DIAGNOSIS — I82409 Acute embolism and thrombosis of unspecified deep veins of unspecified lower extremity: Secondary | ICD-10-CM | POA: Diagnosis not present

## 2013-09-04 NOTE — Telephone Encounter (Signed)
All questions about yesterday's office visit answered.  They will call back for additional questions or concerns.

## 2013-09-04 NOTE — Telephone Encounter (Signed)
I have returned the call, but his voicemail is full and unable to leave a message.  I will continue to try and reach the patient

## 2013-09-04 NOTE — Telephone Encounter (Signed)
Spoke with DJ the clinical supervisor at Dr. Raul Del office.  Per Dr. Ouida Sills patient may hold his coumadin for 5 days prior to his colonoscopy but needs to remain active and can restart after the colonoscopy.  Wife was notified of this information and she verbalized understanding .  Mr. Schliep will start holding his coumadin today.  They are faxing this information in writing.

## 2013-09-05 ENCOUNTER — Telehealth: Payer: Self-pay | Admitting: Gastroenterology

## 2013-09-05 NOTE — Telephone Encounter (Signed)
Lovenox bridge ok with no Lovenox on day of procedure. Off Coumadin for 5 days.

## 2013-09-05 NOTE — Telephone Encounter (Signed)
I have left a detailed message for the patient and his wife.  Needs to make sure he does not take the am of the procedure

## 2013-09-05 NOTE — Telephone Encounter (Signed)
Patient is on a lovenox bridge per Dr. Brigitte Pulse.  Wife wants to make sure that this is ok with you for procedure on Tuesday.

## 2013-09-09 ENCOUNTER — Ambulatory Visit (HOSPITAL_COMMUNITY): Payer: Medicare Other | Admitting: Anesthesiology

## 2013-09-09 ENCOUNTER — Ambulatory Visit (HOSPITAL_COMMUNITY)
Admission: RE | Admit: 2013-09-09 | Discharge: 2013-09-09 | Disposition: A | Discharge status: Home / Self Care | Payer: Medicare Other | Source: Ambulatory Visit | Attending: Gastroenterology | Admitting: Gastroenterology

## 2013-09-09 ENCOUNTER — Encounter (INDEPENDENT_AMBULATORY_CARE_PROVIDER_SITE_OTHER): Payer: Self-pay

## 2013-09-09 ENCOUNTER — Encounter (HOSPITAL_COMMUNITY): Payer: Medicare Other | Admitting: Anesthesiology

## 2013-09-09 ENCOUNTER — Encounter (HOSPITAL_COMMUNITY): Payer: Self-pay | Admitting: Gastroenterology

## 2013-09-09 ENCOUNTER — Encounter (HOSPITAL_COMMUNITY)
Admission: RE | Disposition: A | Discharge status: Home / Self Care | Payer: Self-pay | Source: Ambulatory Visit | Attending: Gastroenterology

## 2013-09-09 DIAGNOSIS — I252 Old myocardial infarction: Secondary | ICD-10-CM | POA: Diagnosis not present

## 2013-09-09 DIAGNOSIS — Z860100 Personal history of colon polyps, unspecified: Secondary | ICD-10-CM | POA: Insufficient documentation

## 2013-09-09 DIAGNOSIS — Z86718 Personal history of other venous thrombosis and embolism: Secondary | ICD-10-CM | POA: Diagnosis not present

## 2013-09-09 DIAGNOSIS — R195 Other fecal abnormalities: Secondary | ICD-10-CM | POA: Diagnosis not present

## 2013-09-09 DIAGNOSIS — K5520 Angiodysplasia of colon without hemorrhage: Secondary | ICD-10-CM | POA: Diagnosis not present

## 2013-09-09 DIAGNOSIS — G4733 Obstructive sleep apnea (adult) (pediatric): Secondary | ICD-10-CM | POA: Insufficient documentation

## 2013-09-09 DIAGNOSIS — I129 Hypertensive chronic kidney disease with stage 1 through stage 4 chronic kidney disease, or unspecified chronic kidney disease: Secondary | ICD-10-CM | POA: Insufficient documentation

## 2013-09-09 DIAGNOSIS — K5730 Diverticulosis of large intestine without perforation or abscess without bleeding: Secondary | ICD-10-CM | POA: Insufficient documentation

## 2013-09-09 DIAGNOSIS — I868 Varicose veins of other specified sites: Secondary | ICD-10-CM | POA: Insufficient documentation

## 2013-09-09 DIAGNOSIS — I10 Essential (primary) hypertension: Secondary | ICD-10-CM | POA: Diagnosis not present

## 2013-09-09 DIAGNOSIS — Z7982 Long term (current) use of aspirin: Secondary | ICD-10-CM | POA: Insufficient documentation

## 2013-09-09 DIAGNOSIS — K648 Other hemorrhoids: Secondary | ICD-10-CM | POA: Insufficient documentation

## 2013-09-09 DIAGNOSIS — D509 Iron deficiency anemia, unspecified: Secondary | ICD-10-CM

## 2013-09-09 DIAGNOSIS — E119 Type 2 diabetes mellitus without complications: Secondary | ICD-10-CM | POA: Diagnosis not present

## 2013-09-09 DIAGNOSIS — Z9861 Coronary angioplasty status: Secondary | ICD-10-CM | POA: Diagnosis not present

## 2013-09-09 DIAGNOSIS — Z79899 Other long term (current) drug therapy: Secondary | ICD-10-CM | POA: Diagnosis not present

## 2013-09-09 DIAGNOSIS — N189 Chronic kidney disease, unspecified: Secondary | ICD-10-CM | POA: Insufficient documentation

## 2013-09-09 DIAGNOSIS — K921 Melena: Secondary | ICD-10-CM | POA: Diagnosis not present

## 2013-09-09 DIAGNOSIS — Z7901 Long term (current) use of anticoagulants: Secondary | ICD-10-CM | POA: Diagnosis not present

## 2013-09-09 DIAGNOSIS — E785 Hyperlipidemia, unspecified: Secondary | ICD-10-CM | POA: Diagnosis not present

## 2013-09-09 DIAGNOSIS — Z87891 Personal history of nicotine dependence: Secondary | ICD-10-CM | POA: Insufficient documentation

## 2013-09-09 DIAGNOSIS — I2510 Atherosclerotic heart disease of native coronary artery without angina pectoris: Secondary | ICD-10-CM | POA: Insufficient documentation

## 2013-09-09 DIAGNOSIS — K649 Unspecified hemorrhoids: Secondary | ICD-10-CM | POA: Diagnosis not present

## 2013-09-09 HISTORY — DX: Other fecal abnormalities: R19.5

## 2013-09-09 HISTORY — PX: COLONOSCOPY WITH PROPOFOL: SHX5780

## 2013-09-09 HISTORY — PX: ESOPHAGOGASTRODUODENOSCOPY (EGD) WITH PROPOFOL: SHX5813

## 2013-09-09 LAB — GLUCOSE, CAPILLARY: Glucose-Capillary: 91 mg/dL (ref 70–99)

## 2013-09-09 SURGERY — ESOPHAGOGASTRODUODENOSCOPY (EGD) WITH PROPOFOL
Anesthesia: Monitor Anesthesia Care

## 2013-09-09 MED ORDER — LACTATED RINGERS IV SOLN
INTRAVENOUS | Status: DC
  Administered 2013-09-09: 1000 mL via INTRAVENOUS

## 2013-09-09 MED ORDER — EPHEDRINE SULFATE 50 MG/ML IJ SOLN
INTRAMUSCULAR | Status: AC
  Filled 2013-09-09: qty 1

## 2013-09-09 MED ORDER — EPHEDRINE SULFATE 50 MG/ML IJ SOLN
INTRAMUSCULAR | Status: DC | PRN
  Administered 2013-09-09 (×3): 5 mg via INTRAVENOUS

## 2013-09-09 MED ORDER — PHENYLEPHRINE 40 MCG/ML (10ML) SYRINGE FOR IV PUSH (FOR BLOOD PRESSURE SUPPORT)
PREFILLED_SYRINGE | INTRAVENOUS | Status: AC
  Filled 2013-09-09: qty 10

## 2013-09-09 MED ORDER — PROPOFOL 10 MG/ML IV BOLUS
INTRAVENOUS | Status: AC
  Filled 2013-09-09: qty 20

## 2013-09-09 MED ORDER — SODIUM CHLORIDE 0.9 % IJ SOLN
INTRAMUSCULAR | Status: AC
  Filled 2013-09-09: qty 10

## 2013-09-09 MED ORDER — PHENYLEPHRINE HCL 10 MG/ML IJ SOLN
INTRAMUSCULAR | Status: DC | PRN
  Administered 2013-09-09 (×3): 40 ug via INTRAVENOUS

## 2013-09-09 MED ORDER — PROPOFOL INFUSION 10 MG/ML OPTIME
INTRAVENOUS | Status: DC | PRN
  Administered 2013-09-09: 300 ug/kg/min via INTRAVENOUS

## 2013-09-09 SURGICAL SUPPLY — 25 items

## 2013-09-09 NOTE — Anesthesia Postprocedure Evaluation (Signed)
  Anesthesia Post-op Note  Patient: [name]  Procedure(s) Performed: Procedure(s) (LRB): ESOPHAGOGASTRODUODENOSCOPY (EGD) WITH PROPOFOL (N/A) COLONOSCOPY WITH PROPOFOL (N/A)  Patient Location: PACU  Anesthesia Type: MAC  Level of Consciousness: awake and alert   Airway and Oxygen Therapy: Patient Spontanous Breathing  Post-op Pain: mild  Post-op Assessment: Post-op Vital signs reviewed, Patient's Cardiovascular Status Stable, Respiratory Function Stable, Patent Airway and No signs of Nausea or vomiting  Last Vitals:  Filed Vitals:   09/09/13 0921  BP: 90/49  Temp:   Resp: 13    Post-op Vital Signs: stable   Complications: No apparent anesthesia complications

## 2013-09-09 NOTE — H&P (View-Only) (Signed)
Subjective:    Patient ID: Alejandro Russell, male    DOB: 1928-05-01, 80 y.o.   MRN: [id number]  HPI  Alejandro Russell is a pleasant 80 year old white male known previously to Dr. Fuller Plan from colonoscopies. He has history of adenomatous colon polyps. Last colonoscopy was done in December of 2012 and he had 2 sessile polyps removed both were tubular adenomas also noted to have internal hemorrhoids and moderate diverticulosis. No followup scheduled due to his age. 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 He is referred today per Dr. Brigitte Pulse with a worsening anemia and iron deficiency. He was noted to have a hemoglobin of 8.6 on 09/02/2013 MCV of 73 and a ferritin of 7. Apparently he was supposed to have been on an iron supplement prior to that time but his wife does not think he was taking it. Nevertheless he did get an iron infusion yesterday which she tolerated well.  Patient has no complaints of dysphagia, odynophagia ,heartburn, indigestion, nausea, vomiting or abdominal pain, no changes in his bowel habits, melena or hematochezia. He does have problems with mild chronic constipation and had been using a stool softener. He says he may go several days between bowel movements. No prior EGD. No regular NSAID use, he is on a baby aspirin in addition to Coumadin. Patient's only complaint is of increased fatigue over the past few months.    Review of Systems  Constitutional: Positive for fatigue.  HENT: Negative.   Eyes: Negative.   Respiratory: Negative.   Cardiovascular: Negative.   Gastrointestinal: Negative.   Endocrine: Negative.   Genitourinary: Negative.   Musculoskeletal: Positive for arthralgias and gait problem.  Skin: Negative.   Allergic/Immunologic: Negative.   Neurological: Negative.   Hematological: Negative.     Psychiatric/Behavioral: Negative.    Outpatient Prescriptions Prior to Visit  Medication Sig Dispense Refill  . aspirin 81 MG tablet Take 81 mg by mouth daily.        . clobetasol (TEMOVATE) 0.05 % external solution Apply topically as needed.        . Cyanocobalamin 1000 MCG/15ML LIQD Take by mouth. 1 injection monthly, Dr Jacquelynn Cree office.       . Enoxaparin Sodium (LOVENOX IJ) Inject as directed. Taking as a bridge since off Coumadin for this procedure      . fenofibrate (TRICOR) 145 MG tablet Take 145 mg by mouth daily.        . folic acid (FOLVITE) 1 MG tablet Take 1 mg by mouth daily.        . isosorbide mononitrate (IMDUR) 60 MG 24 hr tablet Take 60 mg by mouth daily.        . metFORMIN (GLUCOPHAGE) 500 MG tablet Take 500 mg by mouth 2 (two) times daily.        . metoprolol (LOPRESSOR) 100 MG tablet Take 50 mg by mouth 2 (two) times daily.        . niacin (NIASPAN) 1000 MG CR tablet Take 1,000 mg by mouth at bedtime.        Marland Kitchen PARoxetine (PAXIL) 20 MG tablet Take 20 mg by mouth daily.        . ramipril (ALTACE) 2.5 MG capsule Take 2.5 mg by mouth daily.        . rosuvastatin (CRESTOR) 20 MG tablet Take 20 mg by mouth daily.        Marland Kitchen  warfarin (COUMADIN) 3 MG tablet Take 3 mg by mouth as directed. M,W,F 1.5 mg / T TH, S, Sun 3 MG.       No facility-administered medications prior to visit.   No Known Allergies Patient Active Problem List   Diagnosis Date Noted  . CRI (chronic renal insufficiency) 09/03/2013  . Hx of adenomatous colonic polyps 09/03/2013  . CAD (coronary artery disease) of artery bypass graft 09/03/2013  . DVT 11/01/2009  . DIABETES MELLITUS, TYPE II 10/29/2009  . HYPERLIPIDEMIA 10/29/2009  . ANXIETY 10/29/2009  . OBSTRUCTIVE SLEEP APNEA 10/29/2009  . HYPERTENSION 10/29/2009  . DIVERTICULOSIS, COLON 10/29/2009   History  Substance Use Topics  . Smoking status: Former Smoker -- 2.00 packs/day for 31 years    Types: Cigarettes    Quit date: 05/29/1978  .  Smokeless tobacco: Never Used  . Alcohol Use: No     Comment: Hx of alcohol abuse   family history includes Dementia in his father; Emphysema in his mother. There is no history of Colon cancer, Esophageal cancer, or Stomach cancer.     Objective:   Physical Exam well-developed elderly white male in no acute distress, accompanied by his wife blood pressure 116/56 pulse 72 height 5 foot 10 weight 216. HEENT; nontraumatic normocephalic EOMI PERRLA sclera anicteric, Supple; no JVD, Cardiovascular ;regular rate and rhythm with S1-S2 no murmur or gallop, Pulmonary; clear bilaterally, Abdomen; large soft nontender nondistended bowel sounds are active there is no palpable mass or hepatosplenomegaly, Rectal; exam no external lesion noted very scant stool on examining glove trace heme positive, Extremities; no clubbing cyanosis or edema skin warm and dry, Psych; mood and affect appropriate        Assessment & Plan:  #85 80 year old male with new progressive iron deficiency anemia, in setting of chronic anticoagulation with Coumadin. No overt bleeding stool is trace heme positive today and patient asymptomatic with exception of fatigue. Rule out occult upper or lower GI lesion, neoplasm or AVMs. Will also  need to consider small bowel etiology. #2 chronic anticoagulation with Coumadin #3 history of recurrent DVT #4 coronary artery disease status post MI and prior angioplasty #5 history of chronic renal insufficiency #6 history of adenomatous colon polyps last colonoscopy 2012 #7 hypertension #8 adult-onset diabetes mellitus #9 sleep apnea  Plan;Patient received an iron infusion yesterday Will schedule for colonoscopy and EGD with Dr. Josiah Lobo discussed in detail with patient and his wife and they are agreeable to proceed. We also discussed the possibility of capsule endoscopy if endoscopic evaluation is negative We'll obtain consent from Dr. Brigitte Pulse for patient to hold Coumadin for 5 days prior  to the procedure He will need serial CBCs done by primary care.

## 2013-09-09 NOTE — Anesthesia Preprocedure Evaluation (Addendum)
Anesthesia Evaluation  Patient identified by MRN, date of birth, ID band Patient awake    Reviewed: Allergy & Precautions, H&P , NPO status , Patient's Chart, lab work & pertinent test results  Airway Mallampati: II TM Distance: >3 FB Neck ROM: Full    Dental no notable dental hx.    Pulmonary sleep apnea and Continuous Positive Airway Pressure Ventilation , former smoker,  breath sounds clear to auscultation  Pulmonary exam normal       Cardiovascular hypertension, Pt. on medications + CAD, + Past MI, + Cardiac Stents and + CABG Rhythm:Regular Rate:Normal     Neuro/Psych negative neurological ROS  negative psych ROS   GI/Hepatic negative GI ROS, Neg liver ROS,   Endo/Other  diabetes  Renal/GU Renal InsufficiencyRenal disease  negative genitourinary   Musculoskeletal negative musculoskeletal ROS (+)   Abdominal   Peds negative pediatric ROS (+)  Hematology  (+) anemia ,   Anesthesia Other Findings   Reproductive/Obstetrics negative OB ROS                          Anesthesia Physical Anesthesia Plan  ASA: III  Anesthesia Plan: MAC   Post-op Pain Management:    Induction: Intravenous  Airway Management Planned: Nasal Cannula  Additional Equipment:   Intra-op Plan:   Post-operative Plan:   Informed Consent: I have reviewed the patients History and Physical, chart, labs and discussed the procedure including the risks, benefits and alternatives for the proposed anesthesia with the patient or authorized representative who has indicated his/her understanding and acceptance.     Plan Discussed with: CRNA and Surgeon  Anesthesia Plan Comments:         Anesthesia Quick Evaluation

## 2013-09-09 NOTE — Discharge Instructions (Signed)
Colonoscopy Care After These instructions give you information on caring for yourself after your procedure. Your doctor may also give you more specific instructions. Call your doctor if you have any problems or questions after your procedure. HOME CARE  Take it easy for the next 24 hours.  Rest.  Walk or use warm packs on your belly (abdomen) if you have belly cramping or gas.  Do not drive for 24 hours.  You may shower.  Do not sign important papers or use machinery for 24 hours.  Drink enough fluids to keep your pee (urine) clear or pale yellow.  Resume your normal diet. Avoid heavy or fried foods.  Avoid alcohol.  Continue taking your normal medicines.  Only take medicine as told by your doctor. Do not take aspirin. If you had growths (polyps) removed:  Do not take aspirin.  Do not drink alcohol for 7 days or as told by your doctor.  Eat a soft diet for 24 hours. GET HELP RIGHT AWAY IF:  You have a fever.  You pass clumps of tissue (blood clots) or fill the toilet with blood.  You have belly pain that gets worse and medicine does not help.  Your belly is puffy (swollen).  You feel sick to your stomach (nauseous) or throw up (vomit). MAKE SURE YOU:  Understand these instructions.  Will watch your condition.  Will get help right away if you are not doing well or get worse. Document Released: 06/17/2010 Document Revised: 08/07/2011 Document Reviewed: 01/20/2013 [facility] Patient Information 2014 [place]. Gastrointestinal Endoscopy Care After Refer to this sheet in the next few weeks. These instructions provide you with information on caring for yourself after your procedure. Your caregiver may also give you more specific instructions. Your treatment has been planned according to current medical practices, but problems sometimes occur. Call your caregiver if you have any problems or questions after your procedure. HOME CARE INSTRUCTIONS  If you were  given medicine to help you relax (sedative), do not drive, operate machinery, or sign important documents for 24 hours.  Avoid alcohol and hot or warm beverages for the first 24 hours after the procedure.  Only take over-the-counter or prescription medicines for pain, discomfort, or fever as directed by your caregiver. You may resume taking your normal medicines unless your caregiver tells you otherwise. Ask your caregiver when you may resume taking medicines that may cause bleeding, such as aspirin, clopidogrel, or warfarin.  You may return to your normal diet and activities on the day after your procedure, or as directed by your caregiver. Walking may help to reduce any bloated feeling in your abdomen.  Drink enough fluids to keep your urine clear or pale yellow.  You may gargle with salt water if you have a sore throat. SEEK IMMEDIATE MEDICAL CARE IF:  You have severe nausea or vomiting.  You have severe abdominal pain, abdominal cramps that last longer than 6 hours, or abdominal swelling (distention).  You have severe shoulder or back pain.  You have trouble swallowing.  You have shortness of breath, your breathing is shallow, or you are breathing faster than normal.  You have a fever or a rapid heartbeat.  You vomit blood or material that looks like coffee grounds.  You have bloody, black, or tarry stools. MAKE SURE YOU:  Understand these instructions.  Will watch your condition.  Will get help right away if you are not doing well or get worse. Document Released: 12/28/2003 Document Revised: 11/14/2011 Document Reviewed:  08/15/2011 [employer] Patient Information 2014 [place]. Monitored Anesthesia Care  Monitored anesthesia care is an anesthesia service for a medical procedure. Anesthesia is the loss of the ability to feel pain. It is produced by medications called anesthetics. It may affect a small area of your body (local anesthesia), a large area of your body  (regional anesthesia), or your entire body (general anesthesia). The need for monitored anesthesia care depends your procedure, your condition, and the potential need for regional or general anesthesia. It is often provided during procedures where:   General anesthesia may be needed if there are complications. This is because you need special care when you are under general anesthesia.   You will be under local or regional anesthesia. This is so that you are able to have higher levels of anesthesia if needed.   You will receive calming medications (sedatives). This is especially the case if sedatives are given to put you in a semi-conscious state of relaxation (deep sedation). This is because the amount of sedative needed to produce this state can be hard to predict. Too much of a sedative can produce general anesthesia. Monitored anesthesia care is performed by one or more caregivers who have special training in all types of anesthesia. You will need to meet with these caregivers before your procedure. During this meeting, they will ask you about your medical history. They will also give you instructions to follow. (For example, you will need to stop eating and drinking before your procedure. You may also need to stop or change medications you are taking.) During your procedure, your caregivers will stay with you. They will:   Watch your condition. This includes watching you blood pressure, breathing, and level of pain.   Diagnose and treat problems that occur.   Give medications if they are needed. These may include calming medications (sedatives) and anesthetics.   Make sure you are comfortable.  Having monitored anesthesia care does not necessarily mean that you will be under anesthesia. It does mean that your caregivers will be able to manage anesthesia if you need it or if it occurs. It also means that you will be able to have a different type of anesthesia than you are having if you need  it. When your procedure is complete, your caregivers will continue to watch your condition. They will make sure any medications wear off before you are allowed to go home.  Document Released: 02/08/2005 Document Revised: 09/09/2012 Document Reviewed: 06/26/2012 [facility] Patient Information 2014 [place], [place].

## 2013-09-09 NOTE — Transfer of Care (Signed)
Immediate Anesthesia Transfer of Care Note  Patient: Alejandro Russell  Procedure(s) Performed: Procedure(s): ESOPHAGOGASTRODUODENOSCOPY (EGD) WITH PROPOFOL (N/A) COLONOSCOPY WITH PROPOFOL (N/A)  Patient Location: PACU and Endoscopy Unit  Anesthesia Type:MAC  Level of Consciousness: sedated and patient cooperative  Airway & Oxygen Therapy: Patient Spontanous Breathing and Patient connected to face mask oxygen  Post-op Assessment: Report given to PACU RN and Post -op Vital signs reviewed and stable  Post vital signs: Reviewed and stable  Complications: No apparent anesthesia complications

## 2013-09-09 NOTE — Op Note (Addendum)
[facility] [place], [address]   COLONOSCOPY PROCEDURE REPORT PATIENT: Alejandro Russell, Alejandro Russell.  MR#: [id number] BIRTHDATE: [birth year] , 85  yrs. old GENDER: Male ENDOSCOPIST: Ladene Artist, MD, [facility] REFERRED BY:W.  Lutricia Feil, M.D. PROCEDURE DATE:  09/09/2013 PROCEDURE:   Colonoscopy, diagnostic First Screening Colonoscopy - Avg.  risk and is 50 yrs.  old or older - No.  Prior Negative Screening - Now for repeat screening. N/A  History of Adenoma - Now for follow-up colonoscopy & has been > or = to 3 yrs.  N/A  Polyps Removed Today? No.  Recommend repeat exam, <10 yrs? No. ASA CLASS:   Class III INDICATIONS:Iron Deficiency Anemia and heme-positive stool. MEDICATIONS: MAC sedation, administered by CRNA DESCRIPTION OF PROCEDURE:   After the risks benefits and alternatives of the procedure were thoroughly explained, informed consent was obtained.  A digital rectal exam revealed no abnormalities of the rectum.   The Pentax Ped Colon [id number] endoscope was introduced through the anus and advanced to the cecum, which was identified by both the appendix and ileocecal valve. No adverse events experienced.   The quality of the prep was good, using MoviPrep  The instrument was then slowly withdrawn as the colon was fully examined.  COLON FINDINGS: a 4 mm AVM was noted at the cecum.  No bleeding was noted.   Mild diverticulosis was noted in the transverse colon. Moderate diverticulosis was noted in the descending colon and sigmoid colon.   Phlebectasias scattered in the left colon.  More concentrated in the rectosigmoid colon.   The colon was otherwise normal.  There was no diverticulosis, inflammation, polyps or cancers unless previously stated.  Retroflexed views revealed small internal hemorrhoids. The time to cecum=5 minutes 00 seconds. Withdrawal time=11 minutes 00 seconds.  The scope was withdrawn and the procedure completed. COMPLICATIONS: There were no  complications. ENDOSCOPIC IMPRESSION: 1.   AVM at the cecum 2.   Mild diverticulosis in the transverse colon 3.   Moderate diverticulosis in the descending colon and sigmoid colon 4.   Phlebectasias scattered in the left colon.  More concentrated in the rectosigmoid colon. 5.   Small internal hemorrhoids RECOMMENDATIONS: 1.  Hold aspirin, aspirin products, and anti-inflammatory medications long term 2.  AVM is the likely source for fe def anemia and heme + stool. Recurrent or ongoing blood loss is likely especially in setting of chronic anticoagulation. I would anticipate recurrent fe deficiency. Follow up with Dr. Brigitte Pulse 3.  Resume Coumadin and Lovenox today-mgmt per Dr. Brigitte Pulse  eSigned:  Ladene Artist, MD, [facility] 09/09/2013 9:30 AM Revised: 09/09/2013 9:30 AM

## 2013-09-09 NOTE — Op Note (Signed)
[facility] [place], [address]   ENDOSCOPY PROCEDURE REPORT  PATIENT: Alejandro Russell, Alejandro Russell.  MR#: [id number] BIRTHDATE: [birth year] , 85  yrs. old GENDER: Male ENDOSCOPIST: Ladene Artist, MD, [facility] REFERRED BY:  Janalyn Rouse, M.D. PROCEDURE DATE:  09/09/2013 PROCEDURE:  EGD, diagnostic ASA CLASS:     Class III INDICATIONS:  Iron deficiency anemia.   Heme positive stool. MEDICATIONS: There was residual sedation effect present from prior procedure and MAC sedation, administered by CRNA TOPICAL ANESTHETIC: none DESCRIPTION OF PROCEDURE: After the risks benefits and alternatives of the procedure were thoroughly explained, informed consent was obtained.  The Pentax Gastroscope [id number] endoscope was introduced through the mouth and advanced to the second portion of the duodenum. Without limitations.  The instrument was slowly withdrawn as the mucosa was fully examined.    ESOPHAGUS: The mucosa of the esophagus appeared normal. STOMACH: The mucosa and folds of the stomach appeared normal. DUODENUM: The duodenal mucosa showed no abnormalities.  Retroflexed views revealed no abnormalities.  The scope was then withdrawn from the patient and the procedure completed.  COMPLICATIONS: There were no complications.  ENDOSCOPIC IMPRESSION: 1.   The EGD appeared normal  RECOMMENDATIONS: 1.  Call to schedule a follow-up appointment with primary MD   eSigned:  Ladene Artist, MD, [facility] 09/09/2013 9:29 AM

## 2013-09-09 NOTE — Interval H&P Note (Signed)
History and Physical Interval Note:  09/09/2013 8:38 AM  Alejandro Russell  has presented today for surgery, with the diagnosis of Iron deficiency anemia 280.9  The various methods of treatment have been discussed with the patient and family. After consideration of risks, benefits and other options for treatment, the patient has consented to  Procedure(s): ESOPHAGOGASTRODUODENOSCOPY (EGD) WITH PROPOFOL (N/A) COLONOSCOPY WITH PROPOFOL (N/A) as a surgical intervention .  The patient's history has been reviewed, patient examined, no change in status, stable for surgery.  I have reviewed the patient's chart and labs.  Questions were answered to the patient's satisfaction.     Ladene Artist MD

## 2013-09-10 ENCOUNTER — Encounter (HOSPITAL_COMMUNITY): Payer: Self-pay | Admitting: Gastroenterology

## 2013-09-12 DIAGNOSIS — I82409 Acute embolism and thrombosis of unspecified deep veins of unspecified lower extremity: Secondary | ICD-10-CM | POA: Diagnosis not present

## 2013-09-12 DIAGNOSIS — Z7901 Long term (current) use of anticoagulants: Secondary | ICD-10-CM | POA: Diagnosis not present

## 2013-09-15 DIAGNOSIS — I82409 Acute embolism and thrombosis of unspecified deep veins of unspecified lower extremity: Secondary | ICD-10-CM | POA: Diagnosis not present

## 2013-09-15 DIAGNOSIS — D649 Anemia, unspecified: Secondary | ICD-10-CM | POA: Diagnosis not present

## 2013-09-15 DIAGNOSIS — Z7901 Long term (current) use of anticoagulants: Secondary | ICD-10-CM | POA: Diagnosis not present

## 2013-09-23 DIAGNOSIS — Z7901 Long term (current) use of anticoagulants: Secondary | ICD-10-CM | POA: Diagnosis not present

## 2013-09-23 DIAGNOSIS — I82409 Acute embolism and thrombosis of unspecified deep veins of unspecified lower extremity: Secondary | ICD-10-CM | POA: Diagnosis not present

## 2013-09-23 DIAGNOSIS — E538 Deficiency of other specified B group vitamins: Secondary | ICD-10-CM | POA: Diagnosis not present

## 2013-10-07 DIAGNOSIS — I82409 Acute embolism and thrombosis of unspecified deep veins of unspecified lower extremity: Secondary | ICD-10-CM | POA: Diagnosis not present

## 2013-10-07 DIAGNOSIS — Z7901 Long term (current) use of anticoagulants: Secondary | ICD-10-CM | POA: Diagnosis not present

## 2013-10-23 DIAGNOSIS — E119 Type 2 diabetes mellitus without complications: Secondary | ICD-10-CM | POA: Diagnosis not present

## 2013-11-06 DIAGNOSIS — Z7901 Long term (current) use of anticoagulants: Secondary | ICD-10-CM | POA: Diagnosis not present

## 2013-11-06 DIAGNOSIS — D649 Anemia, unspecified: Secondary | ICD-10-CM | POA: Diagnosis not present

## 2013-11-06 DIAGNOSIS — E538 Deficiency of other specified B group vitamins: Secondary | ICD-10-CM | POA: Diagnosis not present

## 2013-11-06 DIAGNOSIS — I82409 Acute embolism and thrombosis of unspecified deep veins of unspecified lower extremity: Secondary | ICD-10-CM | POA: Diagnosis not present

## 2013-11-24 ENCOUNTER — Ambulatory Visit (INDEPENDENT_AMBULATORY_CARE_PROVIDER_SITE_OTHER): Payer: Medicare Other | Admitting: Podiatry

## 2013-11-24 ENCOUNTER — Encounter: Payer: Self-pay | Admitting: Podiatry

## 2013-11-24 DIAGNOSIS — B351 Tinea unguium: Secondary | ICD-10-CM

## 2013-11-24 NOTE — Patient Instructions (Signed)
Diabetes and Foot Care Diabetes may cause you to have problems because of poor blood supply (circulation) to your feet and legs. This may cause the skin on your feet to become thinner, break easier, and heal more slowly. Your skin may become dry, and the skin may peel and crack. You may also have nerve damage in your legs and feet causing decreased feeling in them. You may not notice minor injuries to your feet that could lead to infections or more serious problems. Taking care of your feet is one of the most important things you can do for yourself.  HOME CARE INSTRUCTIONS  Wear shoes at all times, even in the house. Do not go barefoot. Bare feet are easily injured.  Check your feet daily for blisters, cuts, and redness. If you cannot see the bottom of your feet, use a mirror or ask someone for help.  Wash your feet with warm water (do not use hot water) and mild soap. Then pat your feet and the areas between your toes until they are completely dry. Do not soak your feet as this can dry your skin.  Apply a moisturizing lotion or petroleum jelly (that does not contain alcohol and is unscented) to the skin on your feet and to dry, brittle toenails. Do not apply lotion between your toes.  Trim your toenails straight across. Do not dig under them or around the cuticle. File the edges of your nails with an emery board or nail file.  Do not cut corns or calluses or try to remove them with medicine.  Wear clean socks or stockings every day. Make sure they are not too tight. Do not wear knee-high stockings since they may decrease blood flow to your legs.  Wear shoes that fit properly and have enough cushioning. To break in new shoes, wear them for just a few hours a day. This prevents you from injuring your feet. Always look in your shoes before you put them on to be sure there are no objects inside.  Do not cross your legs. This may decrease the blood flow to your feet.  If you find a minor scrape,  cut, or break in the skin on your feet, keep it and the skin around it clean and dry. These areas may be cleansed with mild soap and water. Do not cleanse the area with peroxide, alcohol, or iodine.  When you remove an adhesive bandage, be sure not to damage the skin around it.  If you have a wound, look at it several times a day to make sure it is healing.  Do not use heating pads or hot water bottles. They may burn your skin. If you have lost feeling in your feet or legs, you may not know it is happening until it is too late.  Make sure your health care provider performs a complete foot exam at least annually or more often if you have foot problems. Report any cuts, sores, or bruises to your health care provider immediately. SEEK MEDICAL CARE IF:   You have an injury that is not healing.  You have cuts or breaks in the skin.  You have an ingrown nail.  You notice redness on your legs or feet.  You feel burning or tingling in your legs or feet.  You have pain or cramps in your legs and feet.  Your legs or feet are numb.  Your feet always feel cold. SEEK IMMEDIATE MEDICAL CARE IF:   There is increasing redness,   swelling, or pain in or around a wound.  There is a red line that goes up your leg.  Pus is coming from a wound.  You develop a fever or as directed by your health care provider.  You notice a bad smell coming from an ulcer or wound. Document Released: 05/12/2000 Document Revised: 01/15/2013 Document Reviewed: 10/22/2012 ExitCare Patient Information 2015 [employer]. This information is not intended to replace advice given to you by your health care provider. Make sure you discuss any questions you have with your health care provider.  

## 2013-11-25 NOTE — Progress Notes (Signed)
Subjective:     Patient ID: Alejandro Russell, male   DOB: 02/06/1928, 80 y.o.   MRN: [id number]  HPI patient presents with thick yellow nailbeds 1-5 both feet that become painful   Review of Systems     Objective:   Physical Exam Neurovascular status intact with thick yellow brittle nailbeds 1-5 both feet that are painful when palpated    Assessment:     Mycotic nail infection 1-5 both feet with pain    Plan:     Debride painful nailbeds 1-5 both feet with no iatrogenic bleeding noted

## 2013-12-11 DIAGNOSIS — I82409 Acute embolism and thrombosis of unspecified deep veins of unspecified lower extremity: Secondary | ICD-10-CM | POA: Diagnosis not present

## 2013-12-11 DIAGNOSIS — E538 Deficiency of other specified B group vitamins: Secondary | ICD-10-CM | POA: Diagnosis not present

## 2013-12-11 DIAGNOSIS — Z7901 Long term (current) use of anticoagulants: Secondary | ICD-10-CM | POA: Diagnosis not present

## 2013-12-24 DIAGNOSIS — D649 Anemia, unspecified: Secondary | ICD-10-CM | POA: Diagnosis not present

## 2013-12-24 DIAGNOSIS — Z1331 Encounter for screening for depression: Secondary | ICD-10-CM | POA: Diagnosis not present

## 2013-12-24 DIAGNOSIS — E1149 Type 2 diabetes mellitus with other diabetic neurological complication: Secondary | ICD-10-CM | POA: Diagnosis not present

## 2013-12-24 DIAGNOSIS — I82409 Acute embolism and thrombosis of unspecified deep veins of unspecified lower extremity: Secondary | ICD-10-CM | POA: Diagnosis not present

## 2013-12-24 DIAGNOSIS — I2510 Atherosclerotic heart disease of native coronary artery without angina pectoris: Secondary | ICD-10-CM | POA: Diagnosis not present

## 2013-12-24 DIAGNOSIS — Z7901 Long term (current) use of anticoagulants: Secondary | ICD-10-CM | POA: Diagnosis not present

## 2013-12-24 DIAGNOSIS — I10 Essential (primary) hypertension: Secondary | ICD-10-CM | POA: Diagnosis not present

## 2013-12-24 DIAGNOSIS — I252 Old myocardial infarction: Secondary | ICD-10-CM | POA: Diagnosis not present

## 2013-12-24 DIAGNOSIS — N183 Chronic kidney disease, stage 3 unspecified: Secondary | ICD-10-CM | POA: Diagnosis not present

## 2013-12-24 DIAGNOSIS — E1129 Type 2 diabetes mellitus with other diabetic kidney complication: Secondary | ICD-10-CM | POA: Diagnosis not present

## 2013-12-24 DIAGNOSIS — E785 Hyperlipidemia, unspecified: Secondary | ICD-10-CM | POA: Diagnosis not present

## 2014-01-07 ENCOUNTER — Encounter: Payer: Self-pay | Admitting: Pulmonary Disease

## 2014-01-07 ENCOUNTER — Ambulatory Visit (INDEPENDENT_AMBULATORY_CARE_PROVIDER_SITE_OTHER): Payer: Medicare Other | Admitting: Pulmonary Disease

## 2014-01-07 ENCOUNTER — Encounter (INDEPENDENT_AMBULATORY_CARE_PROVIDER_SITE_OTHER): Payer: Self-pay

## 2014-01-07 VITALS — BP 110/58 | HR 65 | Temp 97.8°F | Ht 72.0 in | Wt 216.2 lb

## 2014-01-07 DIAGNOSIS — I25810 Atherosclerosis of coronary artery bypass graft(s) without angina pectoris: Secondary | ICD-10-CM

## 2014-01-07 DIAGNOSIS — G4733 Obstructive sleep apnea (adult) (pediatric): Secondary | ICD-10-CM | POA: Diagnosis not present

## 2014-01-07 NOTE — Progress Notes (Signed)
   Subjective:    Patient ID: Alejandro Russell, male    DOB: 12/15/1927, 80 y.o.   MRN: [id number]  HPI The patient comes in today for followup of his obstructive sleep apnea. He is wearing CPAP compliantly, and feels that he is sleeping well with his device. He is having no issues with his mask fit or pressure. He and his wife both state that his daytime alertness has been adequate.   Review of Systems  Constitutional: Negative for fever and unexpected weight change.  HENT: Negative for congestion, dental problem, ear pain, nosebleeds, postnasal drip, rhinorrhea, sinus pressure, sneezing, sore throat and trouble swallowing.   Eyes: Negative for redness and itching.  Respiratory: Negative for cough, chest tightness, shortness of breath and wheezing.   Cardiovascular: Negative for palpitations and leg swelling.  Gastrointestinal: Negative for nausea and vomiting.  Genitourinary: Negative for dysuria.  Musculoskeletal: Negative for joint swelling.  Skin: Negative for rash.  Neurological: Negative for headaches.  Hematological: Does not bruise/bleed easily.  Psychiatric/Behavioral: Negative for dysphoric mood. The patient is not nervous/anxious.        Objective:   Physical Exam Overweight male in no acute distress Nose without purulence or discharge noted No skin breakdown or pressure necrosis from the CPAP mask Neck without lymphadenopathy or thyromegaly Lower extremities with mild edema, no cyanosis Alert and oriented, moves all 4 extremities       Assessment & Plan:

## 2014-01-07 NOTE — Patient Instructions (Signed)
Continue on cpap, and keep up with mask changes and supplies.  Have your home care company get a download and send to me the next time you take your machine to them. Work on weight loss. followup with me again in one year.

## 2014-01-07 NOTE — Assessment & Plan Note (Signed)
The patient is doing well from a sleep apnea standpoint on CPAP. He is satisfied with his sleep and daytime alertness, and I've encouraged him to continue working on weight loss. I will see him back in one year if he is doing well there

## 2014-01-21 ENCOUNTER — Encounter (HOSPITAL_COMMUNITY): Payer: Self-pay | Admitting: Emergency Medicine

## 2014-01-21 ENCOUNTER — Emergency Department (HOSPITAL_COMMUNITY): Payer: Medicare Other

## 2014-01-21 ENCOUNTER — Emergency Department (HOSPITAL_COMMUNITY)
Admission: EM | Admit: 2014-01-21 | Discharge: 2014-01-22 | Disposition: A | Discharge status: Home / Self Care | Payer: Medicare Other | Attending: Emergency Medicine | Admitting: Emergency Medicine

## 2014-01-21 DIAGNOSIS — I252 Old myocardial infarction: Secondary | ICD-10-CM | POA: Diagnosis not present

## 2014-01-21 DIAGNOSIS — Z79899 Other long term (current) drug therapy: Secondary | ICD-10-CM | POA: Insufficient documentation

## 2014-01-21 DIAGNOSIS — I442 Atrioventricular block, complete: Secondary | ICD-10-CM | POA: Diagnosis not present

## 2014-01-21 DIAGNOSIS — E119 Type 2 diabetes mellitus without complications: Secondary | ICD-10-CM | POA: Diagnosis not present

## 2014-01-21 DIAGNOSIS — J8410 Pulmonary fibrosis, unspecified: Secondary | ICD-10-CM | POA: Diagnosis not present

## 2014-01-21 DIAGNOSIS — F411 Generalized anxiety disorder: Secondary | ICD-10-CM | POA: Insufficient documentation

## 2014-01-21 DIAGNOSIS — G4733 Obstructive sleep apnea (adult) (pediatric): Secondary | ICD-10-CM | POA: Diagnosis not present

## 2014-01-21 DIAGNOSIS — M546 Pain in thoracic spine: Secondary | ICD-10-CM | POA: Diagnosis not present

## 2014-01-21 DIAGNOSIS — N183 Chronic kidney disease, stage 3 unspecified: Secondary | ICD-10-CM | POA: Insufficient documentation

## 2014-01-21 DIAGNOSIS — Z9861 Coronary angioplasty status: Secondary | ICD-10-CM | POA: Insufficient documentation

## 2014-01-21 DIAGNOSIS — Z7901 Long term (current) use of anticoagulants: Secondary | ICD-10-CM | POA: Insufficient documentation

## 2014-01-21 DIAGNOSIS — I129 Hypertensive chronic kidney disease with stage 1 through stage 4 chronic kidney disease, or unspecified chronic kidney disease: Secondary | ICD-10-CM | POA: Insufficient documentation

## 2014-01-21 DIAGNOSIS — F329 Major depressive disorder, single episode, unspecified: Secondary | ICD-10-CM | POA: Insufficient documentation

## 2014-01-21 DIAGNOSIS — Z87891 Personal history of nicotine dependence: Secondary | ICD-10-CM | POA: Insufficient documentation

## 2014-01-21 DIAGNOSIS — I10 Essential (primary) hypertension: Secondary | ICD-10-CM | POA: Diagnosis not present

## 2014-01-21 DIAGNOSIS — Z85038 Personal history of other malignant neoplasm of large intestine: Secondary | ICD-10-CM | POA: Diagnosis not present

## 2014-01-21 DIAGNOSIS — E785 Hyperlipidemia, unspecified: Secondary | ICD-10-CM | POA: Insufficient documentation

## 2014-01-21 DIAGNOSIS — Z7982 Long term (current) use of aspirin: Secondary | ICD-10-CM | POA: Diagnosis not present

## 2014-01-21 DIAGNOSIS — E669 Obesity, unspecified: Secondary | ICD-10-CM | POA: Diagnosis not present

## 2014-01-21 DIAGNOSIS — F3289 Other specified depressive episodes: Secondary | ICD-10-CM | POA: Insufficient documentation

## 2014-01-21 DIAGNOSIS — D649 Anemia, unspecified: Secondary | ICD-10-CM | POA: Insufficient documentation

## 2014-01-21 DIAGNOSIS — I2510 Atherosclerotic heart disease of native coronary artery without angina pectoris: Secondary | ICD-10-CM | POA: Insufficient documentation

## 2014-01-21 DIAGNOSIS — G609 Hereditary and idiopathic neuropathy, unspecified: Secondary | ICD-10-CM | POA: Diagnosis not present

## 2014-01-21 DIAGNOSIS — Z9889 Other specified postprocedural states: Secondary | ICD-10-CM | POA: Insufficient documentation

## 2014-01-21 LAB — I-STAT TROPONIN, ED
Troponin i, poc: 0 ng/mL (ref 0.00–0.08)
Troponin i, poc: 0 ng/mL (ref 0.00–0.08)

## 2014-01-21 LAB — BASIC METABOLIC PANEL
ANION GAP: 13 (ref 5–15)
BUN: 29 mg/dL — ABNORMAL HIGH (ref 6–23)
CALCIUM: 10.3 mg/dL (ref 8.4–10.5)
CO2: 24 mEq/L (ref 19–32)
CREATININE: 1.26 mg/dL (ref 0.50–1.35)
Chloride: 100 mEq/L (ref 96–112)
GFR calc Af Amer: 59 mL/min — ABNORMAL LOW (ref >90)
GFR calc non Af Amer: 51 mL/min — ABNORMAL LOW (ref >90)
Glucose, Bld: 119 mg/dL — ABNORMAL HIGH (ref 70–99)
Potassium: 4.5 mEq/L (ref 3.7–5.3)
Sodium: 137 mEq/L (ref 137–147)

## 2014-01-21 LAB — CBC
HCT: 37.2 % — ABNORMAL LOW (ref 39.0–52.0)
Hemoglobin: 12 g/dL — ABNORMAL LOW (ref 13.0–17.0)
MCH: 28.6 pg (ref 26.0–34.0)
MCHC: 32.3 g/dL (ref 30.0–36.0)
MCV: 88.6 fL (ref 78.0–100.0)
Platelets: 243 10*3/uL (ref 150–400)
RBC: 4.2 MIL/uL — AB (ref 4.22–5.81)
RDW: 16 % — ABNORMAL HIGH (ref 11.5–15.5)
WBC: 7.3 10*3/uL (ref 4.0–10.5)

## 2014-01-21 NOTE — Discharge Instructions (Signed)
Back Pain, Adult Low back pain is very common. About 1 in 5 people have back pain.The cause of low back pain is rarely dangerous. The pain often gets better over time.About half of people with a sudden onset of back pain feel better in just 2 weeks. About 8 in 10 people feel better by 6 weeks.  CAUSES Some common causes of back pain include:  Strain of the muscles or ligaments supporting the spine.  Wear and tear (degeneration) of the spinal discs.  Arthritis.  Direct injury to the back. DIAGNOSIS Most of the time, the direct cause of low back pain is not known.However, back pain can be treated effectively even when the exact cause of the pain is unknown.Answering your caregiver's questions about your overall health and symptoms is one of the most accurate ways to make sure the cause of your pain is not dangerous. If your caregiver needs more information, he or she may order lab work or imaging tests (X-rays or MRIs).However, even if imaging tests show changes in your back, this usually does not require surgery. HOME CARE INSTRUCTIONS For many people, back pain returns.Since low back pain is rarely dangerous, it is often a condition that people can learn to manageon their own.   Remain active. It is stressful on the back to sit or stand in one place. Do not sit, drive, or stand in one place for more than 30 minutes at a time. Take short walks on level surfaces as soon as pain allows.Try to increase the length of time you walk each day.  Do not stay in bed.Resting more than 1 or 2 days can delay your recovery.  Do not avoid exercise or work.Your body is made to move.It is not dangerous to be active, even though your back may hurt.Your back will likely heal faster if you return to being active before your pain is gone.  Pay attention to your body when you bend and lift. Many people have less discomfortwhen lifting if they bend their knees, keep the load close to their bodies,and  avoid twisting. Often, the most comfortable positions are those that put less stress on your recovering back.  Find a comfortable position to sleep. Use a firm mattress and lie on your side with your knees slightly bent. If you lie on your back, put a pillow under your knees.  Only take over-the-counter or prescription medicines as directed by your caregiver. Over-the-counter medicines to reduce pain and inflammation are often the most helpful.Your caregiver may prescribe muscle relaxant drugs.These medicines help dull your pain so you can more quickly return to your normal activities and healthy exercise.  Put ice on the injured area.  Put ice in a plastic bag.  Place a towel between your skin and the bag.  Leave the ice on for 15-20 minutes, 03-04 times a day for the first 2 to 3 days. After that, ice and heat may be alternated to reduce pain and spasms.  Ask your caregiver about trying back exercises and gentle massage. This may be of some benefit.  Avoid feeling anxious or stressed.Stress increases muscle tension and can worsen back pain.It is important to recognize when you are anxious or stressed and learn ways to manage it.Exercise is a great option. SEEK MEDICAL CARE IF:  You have pain that is not relieved with rest or medicine.  You have pain that does not improve in 1 week.  You have new symptoms.  You are generally not feeling well. SEEK   IMMEDIATE MEDICAL CARE IF:   You have pain that radiates from your back into your legs.  You develop new bowel or bladder control problems.  You have unusual weakness or numbness in your arms or legs.  You develop nausea or vomiting.  You develop abdominal pain.  You feel faint. Document Released: 05/15/2005 Document Revised: 11/14/2011 Document Reviewed: 09/16/2013 Exit[employer] Patient Information 2015 [employer]. This information is not intended to replace advice given to you by your health care provider. Make sure you  discuss any questions you have with your health care provider.  

## 2014-01-21 NOTE — ED Notes (Addendum)
Pt in c/o back pain between his shoulder blades, states it started while he was walking this evening, denies other symptoms with this, denies chest pain, no distress noted- pt denies any pain at this time, states his back pain resolved after he stopped walking

## 2014-01-21 NOTE — ED Provider Notes (Signed)
CSN: [id number]     Arrival date & time 01/21/14  1927 History   First MD Initiated Contact with Patient 01/21/14 2155     Chief Complaint  Patient presents with  . Back Pain     (Consider location/radiation/quality/duration/timing/severity/associated sxs/prior Treatment) Patient is a 80 y.o. male presenting with back pain. The history is provided by the patient.  Back Pain Location:  Thoracic spine Quality:  Aching Radiates to:  Does not radiate Pain severity:  Moderate Pain is:  Same all the time Onset quality:  Sudden Duration: 7 minutes. Timing:  Constant Progression:  Resolved Chronicity:  New Context comment:  While walking Relieved by:  Nothing Worsened by:  Nothing tried Associated symptoms: no chest pain and no fever     Past Medical History  Diagnosis Date  . CAD (coronary artery disease)   . DVT (deep venous thrombosis)     on coumadin  . OSA (obstructive sleep apnea)     wears CPAP  . Anxiety   . Hyperlipidemia   . Type 2 diabetes mellitus   . Diverticulosis   . Hypertension   . Tubulovillous adenoma of colon 1995  . Diverticulosis   . Psoriasis   . Vitamin B12 deficiency   . Obesity   . Myocardial infarct   . Peripheral neuropathy   . Chronic renal disease, stage III   . Depression   . Diabetes mellitus   . Anemia   . Nonspecific abnormal finding in stool contents 09/09/2013   Past Surgical History  Procedure Laterality Date  . Cholecystectomy  may 16,2011  . Coronary stent placement    . Inguinal hernia repair    . Cardiac catheterization      many yrs ago with stent placed  . Esophagogastroduodenoscopy (egd) with propofol N/A 09/09/2013    Procedure: ESOPHAGOGASTRODUODENOSCOPY (EGD) WITH PROPOFOL;  Surgeon: Ladene Artist, MD;  Location: [facility] ENDOSCOPY;  Service: Endoscopy;  Laterality: N/A;  . Colonoscopy with propofol N/A 09/09/2013    Procedure: COLONOSCOPY WITH PROPOFOL;  Surgeon: Ladene Artist, MD;  Location: [facility] ENDOSCOPY;  Service:  Endoscopy;  Laterality: N/A;   Family History  Problem Relation Age of Onset  . Emphysema Mother   . Dementia Father   . Colon cancer Neg Hx   . Esophageal cancer Neg Hx   . Stomach cancer Neg Hx    History  Substance Use Topics  . Smoking status: Former Smoker -- 2.00 packs/day for 31 years    Types: Cigarettes    Quit date: 05/29/1978  . Smokeless tobacco: Never Used  . Alcohol Use: No     Comment: Hx of alcohol abuse    Review of Systems  Constitutional: Negative for fever and chills.  Respiratory: Negative for cough and shortness of breath.   Cardiovascular: Negative for chest pain and leg swelling.  Musculoskeletal: Positive for back pain.  All other systems reviewed and are negative.     Allergies  Review of patient's allergies indicates no known allergies.  Home Medications   Prior to Admission medications   Medication Sig Start Date End Date Taking? Authorizing Provider  aspirin EC 81 MG tablet Take 81 mg by mouth daily.   Yes Historical Provider, MD  B Complex-C (SUPER B COMPLEX PO) Take 1 capsule by mouth daily.   Yes Historical Provider, MD  buPROPion (WELLBUTRIN XL) 300 MG 24 hr tablet Take 300 mg by mouth every morning.   Yes Historical Provider, MD  Cholecalciferol (VITAMIN D-3) 5000 UNITS  TABS Take 5,000 Units by mouth daily.   Yes Historical Provider, MD  clobetasol (TEMOVATE) 0.05 % external solution Apply 1 application topically as needed (Scalp).    Yes Historical Provider, MD  cyanocobalamin (,VITAMIN B-12,) 1000 MCG/ML injection Inject 1,000 mcg into the muscle every 30 (thirty) days.   Yes Historical Provider, MD  ergocalciferol (VITAMIN D2) 50000 UNITS capsule Take 50,000 Units by mouth once a week.   Yes Historical Provider, MD  fenofibrate (TRICOR) 145 MG tablet Take 145 mg by mouth daily.     Yes Historical Provider, MD  folic acid (FOLVITE) 1 MG tablet Take 1 mg by mouth daily.     Yes Historical Provider, MD  isosorbide mononitrate (IMDUR) 60  MG 24 hr tablet Take 60 mg by mouth every morning.    Yes Historical Provider, MD  metFORMIN (GLUCOPHAGE) 500 MG tablet Take 500 mg by mouth 2 (two) times daily.     Yes Historical Provider, MD  metoprolol (LOPRESSOR) 100 MG tablet Take 50 mg by mouth 2 (two) times daily.     Yes Historical Provider, MD  niacin (NIASPAN) 1000 MG CR tablet Take 1,000 mg by mouth at bedtime.     Yes Historical Provider, MD  Omega-3 Fatty Acids (FISH OIL PO) Take 3 capsules by mouth daily.   Yes Historical Provider, MD  PARoxetine (PAXIL) 20 MG tablet Take 20 mg by mouth at bedtime.    Yes Historical Provider, MD  ramipril (ALTACE) 2.5 MG capsule Take 2.5 mg by mouth every morning.    Yes Historical Provider, MD  rosuvastatin (CRESTOR) 20 MG tablet Take 20 mg by mouth daily.     Yes Historical Provider, MD  warfarin (COUMADIN) 3 MG tablet Take 1.5-3 mg by mouth daily. Takes 1.5mg  on mondays and wednesdays. Takes 1 tablet all other days   Yes Historical Provider, MD   BP 150/58  Pulse 69  Temp(Src) 98 F (36.7 C) (Oral)  Resp 18  Ht 5\' 11"  (1.803 m)  Wt 214 lb (97.07 kg)  BMI 29.86 kg/m2  SpO2 98% Physical Exam  Nursing note and vitals reviewed. Constitutional: He is oriented to person, place, and time. He appears well-developed and well-nourished. No distress.  HENT:  Head: Normocephalic and atraumatic.  Mouth/Throat: Oropharynx is clear and moist. No oropharyngeal exudate.  Eyes: EOM are normal. Pupils are equal, round, and reactive to light.  Neck: Normal range of motion. Neck supple.  Cardiovascular: Normal rate and regular rhythm.  Exam reveals no friction rub.   No murmur heard. Pulmonary/Chest: Effort normal and breath sounds normal. No respiratory distress. He has no wheezes. He has no rales.  Abdominal: Soft. He exhibits no distension. There is no tenderness. There is no rebound.  Musculoskeletal: Normal range of motion. He exhibits no edema.  Neurological: He is alert and oriented to person,  place, and time.  Skin: No rash noted. He is not diaphoretic.    ED Course  Procedures (including critical care time) Labs Review Labs Reviewed  CBC - Abnormal; Notable for the following:    RBC 4.20 (*)    Hemoglobin 12.0 (*)    HCT 37.2 (*)    RDW 16.0 (*)    All other components within normal limits  BASIC METABOLIC PANEL - Abnormal; Notable for the following:    Glucose, Bld 119 (*)    BUN 29 (*)    GFR calc non Af Amer 51 (*)    GFR calc Af Amer 59 (*)  All other components within normal limits  I-STAT TROPOININ, ED  I-STAT TROPOININ, ED    Imaging Review Dg Chest 2 View  01/21/2014   CLINICAL DATA:  Chest pain with history of diabetes and hypertension and previous MI  EXAM: CHEST  2 VIEW  COMPARISON:  Portable chest x-ray of Oct 11, 2009.  FINDINGS: New the lungs are adequately inflated. There are coarse interstitial lung markings bilaterally which are stable. There is subsegmental atelectasis versus scarring at the left lung base. The heart and pulmonary vascularity are within the limits of normal. The mediastinum is normal in width. There is no pleural effusion or pneumothorax. The observed bony thorax is unremarkable.  IMPRESSION: There is no evidence of CHF. There are chronic stable interstitial changes likely reflecting the patient's smoking history. There is subsegmental atelectasis versus scarring at the left lung base.   Electronically Signed   By: David  Martinique   On: 01/21/2014 20:50     EKG Interpretation   Date/Time:  Wednesday January 21 2014 19:31:43 EDT Ventricular Rate:  77 PR Interval:  252 QRS Duration: 128 QT Interval:  378 QTC Calculation: 427 R Axis:   -18 Text Interpretation:  Sinus rhythm with 1st degree A-V block Left  ventricular hypertrophy with QRS widening Similar to prior Confirmed by  Mingo Amber  MD, [place] ([id number]) on 01/21/2014 10:39:15 PM      MDM   Final diagnoses:  Midline thoracic back pain    60M with back pain. Began while  walking, radiated up into his upper back. Wife thought he had chest pain at that time also, but patient denies any CP. Patient said pain lasted only about 7 minutes, none at this time. Walking without pain now. Exam benign. EKG similar to prior, initial labs ok. I spoke to Dr. Ardeth Perfect, who agreed this is atypical pain. Will do serial troponin and if negative, f/u with Dr. Brigitte Pulse in the morning. Serial troponins ok. Stable for discharge.  Evelina Bucy, MD 01/22/14 [phone]

## 2014-01-28 DIAGNOSIS — I82409 Acute embolism and thrombosis of unspecified deep veins of unspecified lower extremity: Secondary | ICD-10-CM | POA: Diagnosis not present

## 2014-01-28 DIAGNOSIS — E538 Deficiency of other specified B group vitamins: Secondary | ICD-10-CM | POA: Diagnosis not present

## 2014-01-28 DIAGNOSIS — Z7901 Long term (current) use of anticoagulants: Secondary | ICD-10-CM | POA: Diagnosis not present

## 2014-02-04 DIAGNOSIS — Z85820 Personal history of malignant melanoma of skin: Secondary | ICD-10-CM | POA: Diagnosis not present

## 2014-02-04 DIAGNOSIS — Z85828 Personal history of other malignant neoplasm of skin: Secondary | ICD-10-CM | POA: Diagnosis not present

## 2014-02-04 DIAGNOSIS — L738 Other specified follicular disorders: Secondary | ICD-10-CM | POA: Diagnosis not present

## 2014-02-04 DIAGNOSIS — L678 Other hair color and hair shaft abnormalities: Secondary | ICD-10-CM | POA: Diagnosis not present

## 2014-02-18 ENCOUNTER — Ambulatory Visit: Payer: Medicare Other | Admitting: Pulmonary Disease

## 2014-03-02 ENCOUNTER — Ambulatory Visit (INDEPENDENT_AMBULATORY_CARE_PROVIDER_SITE_OTHER): Payer: Medicare Other | Admitting: Podiatry

## 2014-03-02 DIAGNOSIS — M79673 Pain in unspecified foot: Secondary | ICD-10-CM

## 2014-03-02 DIAGNOSIS — B351 Tinea unguium: Secondary | ICD-10-CM | POA: Diagnosis not present

## 2014-03-02 NOTE — Progress Notes (Signed)
   Subjective:    Patient ID: Alejandro Russell, male    DOB: 10-19-1927, 80 y.o.   MRN: [id number]  HPI  Nail debridement   Review of Systems     Objective:   Physical Exam        Assessment & Plan:

## 2014-03-03 NOTE — Progress Notes (Signed)
Subjective:     Patient ID: Alejandro Russell, male   DOB: 11/28/1927, 80 y.o.   MRN: [id number]  HPI patient presents with thick yellow brittle nailbeds 1-5 both feet that are painful   Review of Systems     Objective:   Physical Exam Neurovascular status intact with yellow brittle painful nailbeds 1-5 of both feet    Assessment:     Mycotic nail infection with pain 1-5 both feet    Plan:     Debride painful nailbeds 1-5 both feet with no iatrogenic bleeding noted      

## 2014-03-04 DIAGNOSIS — I82509 Chronic embolism and thrombosis of unspecified deep veins of unspecified lower extremity: Secondary | ICD-10-CM | POA: Diagnosis not present

## 2014-03-04 DIAGNOSIS — E538 Deficiency of other specified B group vitamins: Secondary | ICD-10-CM | POA: Diagnosis not present

## 2014-03-04 DIAGNOSIS — Z23 Encounter for immunization: Secondary | ICD-10-CM | POA: Diagnosis not present

## 2014-03-04 DIAGNOSIS — Z7901 Long term (current) use of anticoagulants: Secondary | ICD-10-CM | POA: Diagnosis not present

## 2014-04-02 ENCOUNTER — Other Ambulatory Visit: Payer: Self-pay | Admitting: Dermatology

## 2014-04-02 DIAGNOSIS — L821 Other seborrheic keratosis: Secondary | ICD-10-CM | POA: Diagnosis not present

## 2014-04-02 DIAGNOSIS — C44712 Basal cell carcinoma of skin of right lower limb, including hip: Secondary | ICD-10-CM | POA: Diagnosis not present

## 2014-04-02 DIAGNOSIS — D485 Neoplasm of uncertain behavior of skin: Secondary | ICD-10-CM | POA: Diagnosis not present

## 2014-04-02 DIAGNOSIS — L570 Actinic keratosis: Secondary | ICD-10-CM | POA: Diagnosis not present

## 2014-04-02 DIAGNOSIS — D1801 Hemangioma of skin and subcutaneous tissue: Secondary | ICD-10-CM | POA: Diagnosis not present

## 2014-04-02 DIAGNOSIS — Z85828 Personal history of other malignant neoplasm of skin: Secondary | ICD-10-CM | POA: Diagnosis not present

## 2014-04-02 DIAGNOSIS — Z85820 Personal history of malignant melanoma of skin: Secondary | ICD-10-CM | POA: Diagnosis not present

## 2014-04-16 DIAGNOSIS — E1129 Type 2 diabetes mellitus with other diabetic kidney complication: Secondary | ICD-10-CM | POA: Diagnosis not present

## 2014-04-16 DIAGNOSIS — Z125 Encounter for screening for malignant neoplasm of prostate: Secondary | ICD-10-CM | POA: Diagnosis not present

## 2014-04-16 DIAGNOSIS — Z0000 Encounter for general adult medical examination without abnormal findings: Secondary | ICD-10-CM | POA: Diagnosis not present

## 2014-04-16 DIAGNOSIS — E785 Hyperlipidemia, unspecified: Secondary | ICD-10-CM | POA: Diagnosis not present

## 2014-04-16 DIAGNOSIS — I10 Essential (primary) hypertension: Secondary | ICD-10-CM | POA: Diagnosis not present

## 2014-04-16 DIAGNOSIS — E538 Deficiency of other specified B group vitamins: Secondary | ICD-10-CM | POA: Diagnosis not present

## 2014-04-16 DIAGNOSIS — E559 Vitamin D deficiency, unspecified: Secondary | ICD-10-CM | POA: Diagnosis not present

## 2014-04-28 DIAGNOSIS — F329 Major depressive disorder, single episode, unspecified: Secondary | ICD-10-CM | POA: Diagnosis not present

## 2014-04-28 DIAGNOSIS — E1129 Type 2 diabetes mellitus with other diabetic kidney complication: Secondary | ICD-10-CM | POA: Diagnosis not present

## 2014-04-28 DIAGNOSIS — I82509 Chronic embolism and thrombosis of unspecified deep veins of unspecified lower extremity: Secondary | ICD-10-CM | POA: Diagnosis not present

## 2014-04-28 DIAGNOSIS — Z0000 Encounter for general adult medical examination without abnormal findings: Secondary | ICD-10-CM | POA: Diagnosis not present

## 2014-04-28 DIAGNOSIS — Z7901 Long term (current) use of anticoagulants: Secondary | ICD-10-CM | POA: Diagnosis not present

## 2014-04-28 DIAGNOSIS — I10 Essential (primary) hypertension: Secondary | ICD-10-CM | POA: Diagnosis not present

## 2014-04-28 DIAGNOSIS — E538 Deficiency of other specified B group vitamins: Secondary | ICD-10-CM | POA: Diagnosis not present

## 2014-04-28 DIAGNOSIS — E1140 Type 2 diabetes mellitus with diabetic neuropathy, unspecified: Secondary | ICD-10-CM | POA: Diagnosis not present

## 2014-04-28 DIAGNOSIS — E785 Hyperlipidemia, unspecified: Secondary | ICD-10-CM | POA: Diagnosis not present

## 2014-05-08 DIAGNOSIS — Z1212 Encounter for screening for malignant neoplasm of rectum: Secondary | ICD-10-CM | POA: Diagnosis not present

## 2014-05-12 DIAGNOSIS — Z7901 Long term (current) use of anticoagulants: Secondary | ICD-10-CM | POA: Diagnosis not present

## 2014-05-12 DIAGNOSIS — I82509 Chronic embolism and thrombosis of unspecified deep veins of unspecified lower extremity: Secondary | ICD-10-CM | POA: Diagnosis not present

## 2014-05-25 DIAGNOSIS — Z85828 Personal history of other malignant neoplasm of skin: Secondary | ICD-10-CM | POA: Diagnosis not present

## 2014-05-25 DIAGNOSIS — L718 Other rosacea: Secondary | ICD-10-CM | POA: Diagnosis not present

## 2014-05-25 DIAGNOSIS — L738 Other specified follicular disorders: Secondary | ICD-10-CM | POA: Diagnosis not present

## 2014-05-25 DIAGNOSIS — L711 Rhinophyma: Secondary | ICD-10-CM | POA: Diagnosis not present

## 2014-05-25 DIAGNOSIS — Z85820 Personal history of malignant melanoma of skin: Secondary | ICD-10-CM | POA: Diagnosis not present

## 2014-06-01 ENCOUNTER — Ambulatory Visit (INDEPENDENT_AMBULATORY_CARE_PROVIDER_SITE_OTHER): Payer: Medicare Other | Admitting: Podiatry

## 2014-06-01 DIAGNOSIS — M79673 Pain in unspecified foot: Secondary | ICD-10-CM

## 2014-06-01 DIAGNOSIS — B351 Tinea unguium: Secondary | ICD-10-CM | POA: Diagnosis not present

## 2014-06-01 NOTE — Progress Notes (Signed)
Subjective:     Patient ID: Alejandro Russell, male   DOB: [birth year], 81 y.o.   MRN: [id number]  HPI patient presents with thick yellow brittle nailbeds 1-5 both feet that are painful   Review of Systems     Objective:   Physical Exam Neurovascular status intact with yellow brittle painful nailbeds 1-5 of both feet    Assessment:     Mycotic nail infection with pain 1-5 both feet    Plan:     Debride painful nailbeds 1-5 both feet with no iatrogenic bleeding noted

## 2014-06-04 ENCOUNTER — Ambulatory Visit: Payer: Self-pay

## 2014-06-12 DIAGNOSIS — I82509 Chronic embolism and thrombosis of unspecified deep veins of unspecified lower extremity: Secondary | ICD-10-CM | POA: Diagnosis not present

## 2014-06-12 DIAGNOSIS — Z7901 Long term (current) use of anticoagulants: Secondary | ICD-10-CM | POA: Diagnosis not present

## 2014-06-16 DIAGNOSIS — E538 Deficiency of other specified B group vitamins: Secondary | ICD-10-CM | POA: Diagnosis not present

## 2014-07-10 DIAGNOSIS — I82509 Chronic embolism and thrombosis of unspecified deep veins of unspecified lower extremity: Secondary | ICD-10-CM | POA: Diagnosis not present

## 2014-07-10 DIAGNOSIS — E538 Deficiency of other specified B group vitamins: Secondary | ICD-10-CM | POA: Diagnosis not present

## 2014-07-10 DIAGNOSIS — Z7901 Long term (current) use of anticoagulants: Secondary | ICD-10-CM | POA: Diagnosis not present

## 2014-08-06 DIAGNOSIS — Z85828 Personal history of other malignant neoplasm of skin: Secondary | ICD-10-CM | POA: Diagnosis not present

## 2014-08-06 DIAGNOSIS — L718 Other rosacea: Secondary | ICD-10-CM | POA: Diagnosis not present

## 2014-08-06 DIAGNOSIS — Z85820 Personal history of malignant melanoma of skin: Secondary | ICD-10-CM | POA: Diagnosis not present

## 2014-08-06 DIAGNOSIS — L0201 Cutaneous abscess of face: Secondary | ICD-10-CM | POA: Diagnosis not present

## 2014-08-06 DIAGNOSIS — L738 Other specified follicular disorders: Secondary | ICD-10-CM | POA: Diagnosis not present

## 2014-08-07 DIAGNOSIS — Z7901 Long term (current) use of anticoagulants: Secondary | ICD-10-CM | POA: Diagnosis not present

## 2014-08-07 DIAGNOSIS — I82509 Chronic embolism and thrombosis of unspecified deep veins of unspecified lower extremity: Secondary | ICD-10-CM | POA: Diagnosis not present

## 2014-08-07 DIAGNOSIS — E538 Deficiency of other specified B group vitamins: Secondary | ICD-10-CM | POA: Diagnosis not present

## 2014-08-31 ENCOUNTER — Ambulatory Visit (INDEPENDENT_AMBULATORY_CARE_PROVIDER_SITE_OTHER): Payer: Medicare Other | Admitting: Podiatry

## 2014-08-31 DIAGNOSIS — E538 Deficiency of other specified B group vitamins: Secondary | ICD-10-CM | POA: Diagnosis not present

## 2014-08-31 DIAGNOSIS — M79673 Pain in unspecified foot: Secondary | ICD-10-CM

## 2014-08-31 DIAGNOSIS — B351 Tinea unguium: Secondary | ICD-10-CM | POA: Diagnosis not present

## 2014-08-31 DIAGNOSIS — Z7901 Long term (current) use of anticoagulants: Secondary | ICD-10-CM | POA: Diagnosis not present

## 2014-08-31 DIAGNOSIS — I82509 Chronic embolism and thrombosis of unspecified deep veins of unspecified lower extremity: Secondary | ICD-10-CM | POA: Diagnosis not present

## 2014-08-31 NOTE — Progress Notes (Signed)
Subjective:     Patient ID: Alejandro Russell, male   DOB: 1927/06/01, 81 y.o.   MRN: [id number]  HPI patient presents with thick yellow brittle nailbeds 1-5 both feet that are painful   Review of Systems     Objective:   Physical Exam Neurovascular status intact with yellow brittle painful nailbeds 1-5 of both feet    Assessment:     Mycotic nail infection with pain 1-5 both feet    Plan:     Debride painful nailbeds 1-5 both feet with no iatrogenic bleeding noted

## 2014-09-25 DIAGNOSIS — Z7901 Long term (current) use of anticoagulants: Secondary | ICD-10-CM | POA: Diagnosis not present

## 2014-09-25 DIAGNOSIS — E538 Deficiency of other specified B group vitamins: Secondary | ICD-10-CM | POA: Diagnosis not present

## 2014-09-25 DIAGNOSIS — I82509 Chronic embolism and thrombosis of unspecified deep veins of unspecified lower extremity: Secondary | ICD-10-CM | POA: Diagnosis not present

## 2014-10-27 DIAGNOSIS — D649 Anemia, unspecified: Secondary | ICD-10-CM | POA: Diagnosis not present

## 2014-10-27 DIAGNOSIS — Z7901 Long term (current) use of anticoagulants: Secondary | ICD-10-CM | POA: Diagnosis not present

## 2014-10-27 DIAGNOSIS — I82509 Chronic embolism and thrombosis of unspecified deep veins of unspecified lower extremity: Secondary | ICD-10-CM | POA: Diagnosis not present

## 2014-10-27 DIAGNOSIS — E538 Deficiency of other specified B group vitamins: Secondary | ICD-10-CM | POA: Diagnosis not present

## 2014-12-03 DIAGNOSIS — E538 Deficiency of other specified B group vitamins: Secondary | ICD-10-CM | POA: Diagnosis not present

## 2014-12-03 DIAGNOSIS — I82509 Chronic embolism and thrombosis of unspecified deep veins of unspecified lower extremity: Secondary | ICD-10-CM | POA: Diagnosis not present

## 2014-12-03 DIAGNOSIS — L820 Inflamed seborrheic keratosis: Secondary | ICD-10-CM | POA: Diagnosis not present

## 2014-12-03 DIAGNOSIS — Z85820 Personal history of malignant melanoma of skin: Secondary | ICD-10-CM | POA: Diagnosis not present

## 2014-12-03 DIAGNOSIS — D649 Anemia, unspecified: Secondary | ICD-10-CM | POA: Diagnosis not present

## 2014-12-03 DIAGNOSIS — Z85828 Personal history of other malignant neoplasm of skin: Secondary | ICD-10-CM | POA: Diagnosis not present

## 2014-12-03 DIAGNOSIS — D485 Neoplasm of uncertain behavior of skin: Secondary | ICD-10-CM | POA: Diagnosis not present

## 2014-12-03 DIAGNOSIS — Z7901 Long term (current) use of anticoagulants: Secondary | ICD-10-CM | POA: Diagnosis not present

## 2014-12-07 ENCOUNTER — Ambulatory Visit: Payer: Self-pay

## 2014-12-08 ENCOUNTER — Encounter: Payer: Self-pay | Admitting: Podiatry

## 2014-12-08 ENCOUNTER — Ambulatory Visit (INDEPENDENT_AMBULATORY_CARE_PROVIDER_SITE_OTHER): Payer: Medicare Other | Admitting: Podiatry

## 2014-12-08 DIAGNOSIS — M79673 Pain in unspecified foot: Secondary | ICD-10-CM

## 2014-12-08 DIAGNOSIS — B351 Tinea unguium: Secondary | ICD-10-CM | POA: Diagnosis not present

## 2014-12-08 NOTE — Progress Notes (Signed)
Patient ID: Alejandro Russell, male   DOB: Feb 10, 1928, 81 y.o.   MRN: [id number] Complaint:  Visit Type: Patient returns to my office for continued preventative foot care services. Complaint: Patient states" my nails have grown long and thick and become painful to walk and wear shoes" Patient has been diagnosed with pre Diabetes.Marland Kitchen He presents for preventative foot care services. No changes to ROS  Podiatric Exam: Vascular: dorsalis pedis and posterior tibial pulses are palpable bilateral. Capillary return is immediate. Temperature gradient is WNL. Skin turgor WNL  Sensorium: Normal Semmes Weinstein monofilament test. Normal tactile sensation bilaterally. Nail Exam: Pt has thick disfigured discolored nails with subungual debris noted bilateral entire nail hallux through fifth toenails Ulcer Exam: There is no evidence of ulcer or pre-ulcerative changes or infection. Orthopedic Exam: Muscle tone and strength are WNL. No limitations in general ROM. No crepitus or effusions noted. Foot type and digits show no abnormalities. Bony prominences are unremarkable. Skin: No Porokeratosis. No infection or ulcers  Diagnosis:  Tinea unguium, Pain in right toe, pain in left toes  Treatment & Plan Procedures and Treatment: Consent by patient was obtained for treatment procedures. The patient understood the discussion of treatment and procedures well. All questions were answered thoroughly reviewed. Debridement of mycotic and hypertrophic toenails, 1 through 5 bilateral and clearing of subungual debris. No ulceration, no infection noted.  Return Visit-Office Procedure: Patient instructed to return to the office for a follow up visit 3 months for continued evaluation and treatment.

## 2014-12-25 IMAGING — CR DG CHEST 2V
2 series · 2 of 2 positions shown · non-contrast
Comparison: Portable chest x-ray October 11, 2009.

CLINICAL DATA: Chest pain with history of diabetes and hypertension
and previous MI

EXAM:
CHEST  2 VIEW

[w chest pa]
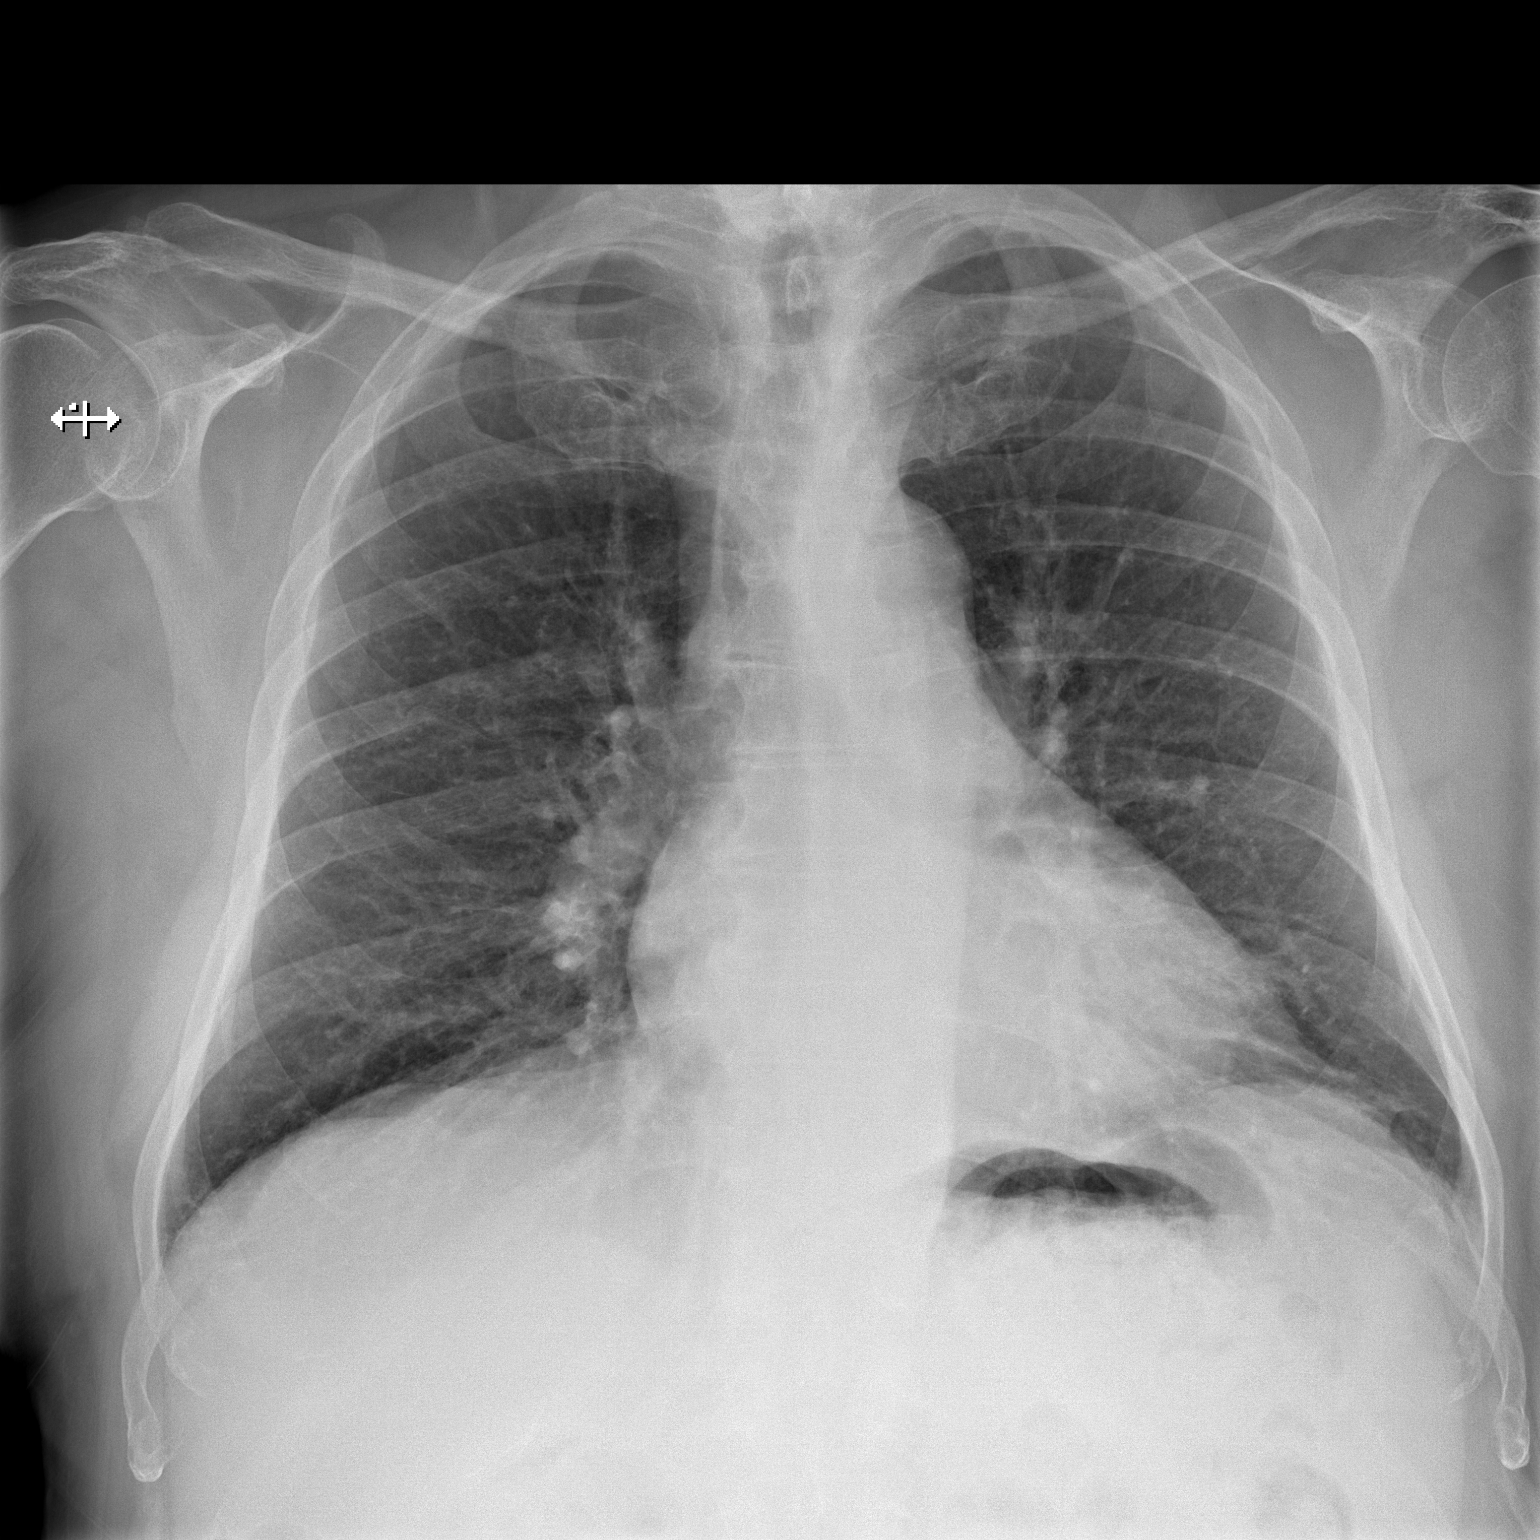

[w chest lat]
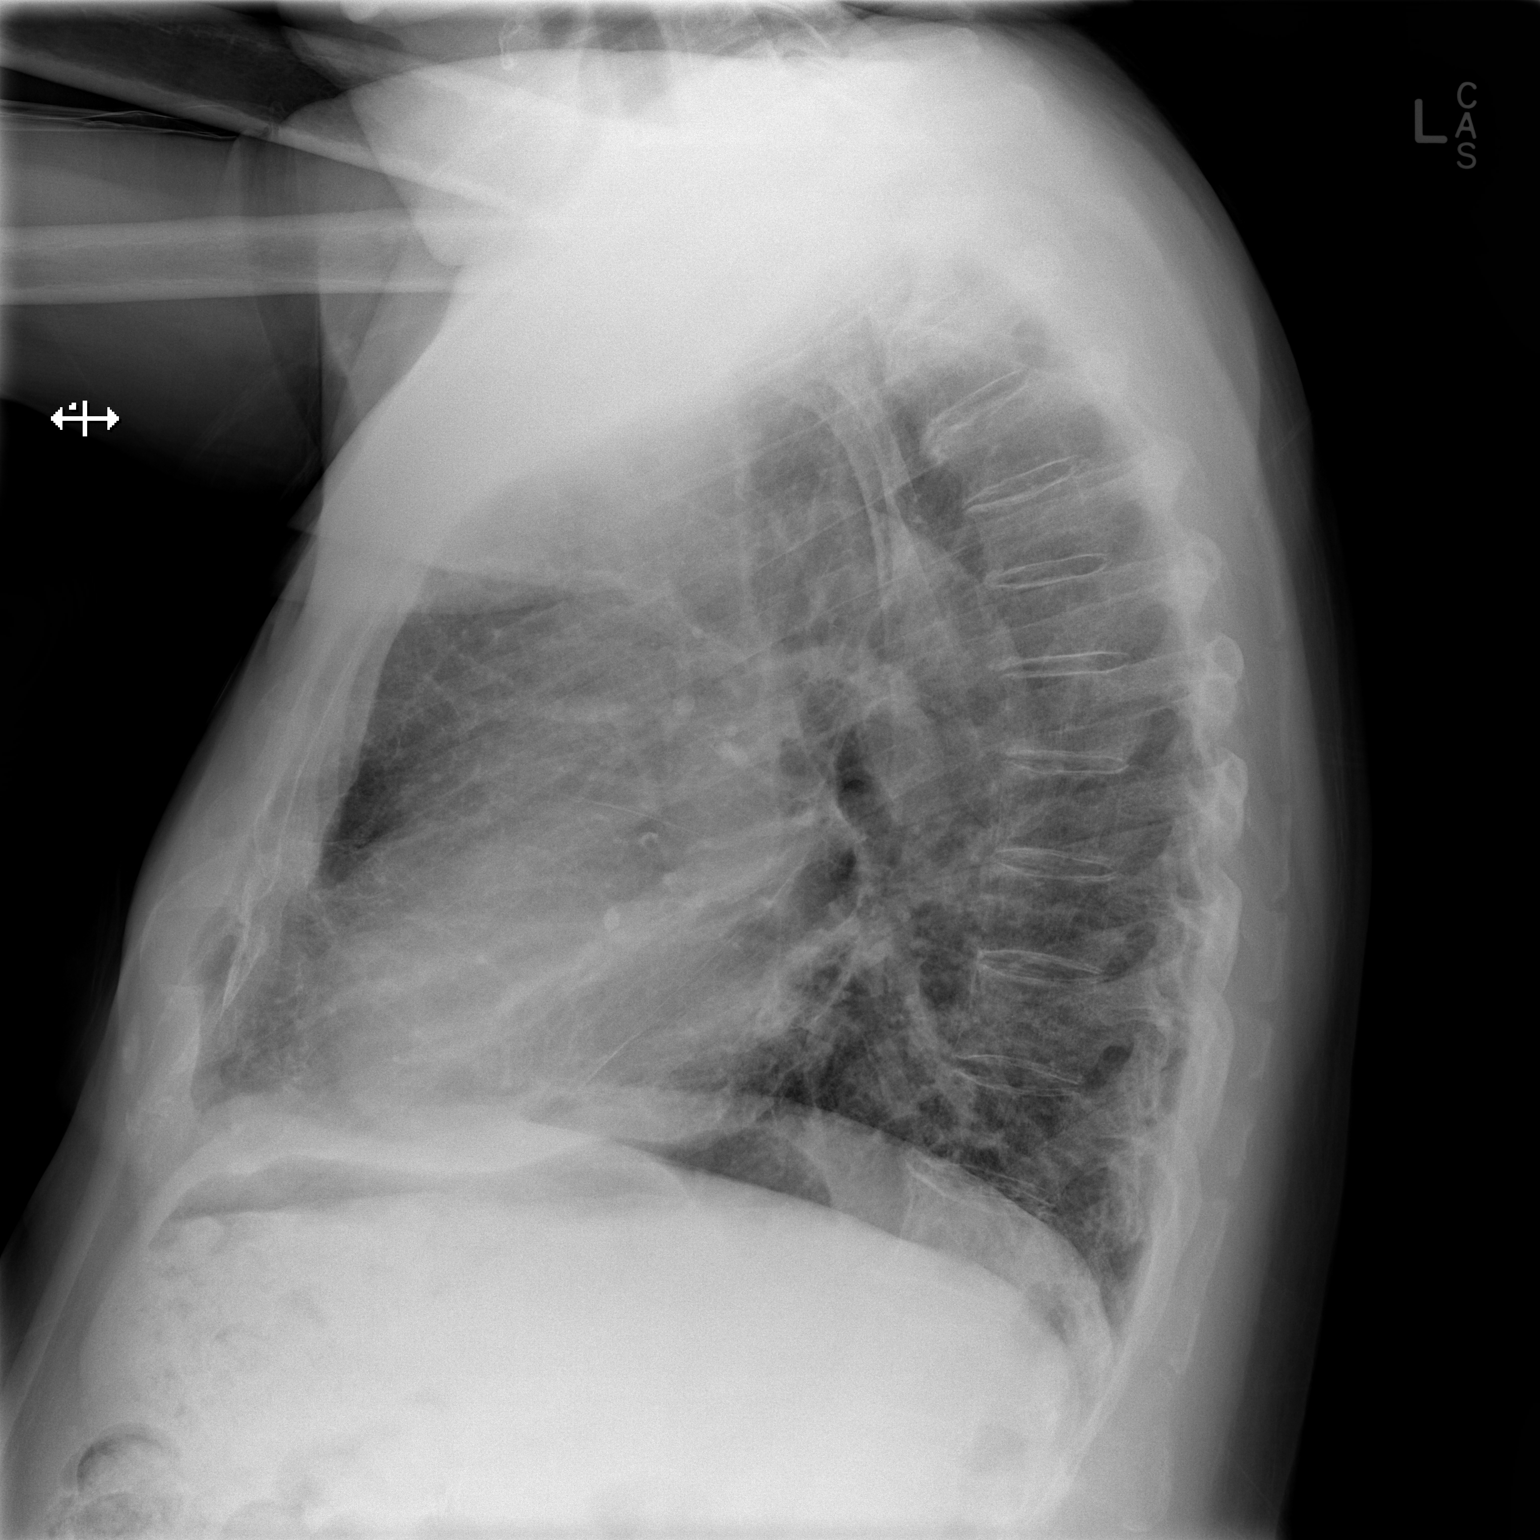

[2 of 2 positions shown; findings below may reference images not displayed]

FINDINGS: New the lungs are adequately inflated. There are coarse interstitial
lung markings bilaterally which are stable. There is subsegmental
atelectasis versus scarring at the left lung base. The heart and
pulmonary vascularity are within the limits of normal. The
mediastinum is normal in width. There is no pleural effusion or
pneumothorax. The observed bony thorax is unremarkable.
IMPRESSION: There is no evidence of CHF. There are chronic stable interstitial
changes likely reflecting the patient's smoking history. There is
subsegmental atelectasis versus scarring at the left lung base.

## 2015-01-11 ENCOUNTER — Ambulatory Visit: Payer: Medicare Other | Admitting: Pulmonary Disease

## 2015-01-13 DIAGNOSIS — E1140 Type 2 diabetes mellitus with diabetic neuropathy, unspecified: Secondary | ICD-10-CM | POA: Diagnosis not present

## 2015-01-13 DIAGNOSIS — F329 Major depressive disorder, single episode, unspecified: Secondary | ICD-10-CM | POA: Diagnosis not present

## 2015-01-13 DIAGNOSIS — E785 Hyperlipidemia, unspecified: Secondary | ICD-10-CM | POA: Diagnosis not present

## 2015-01-13 DIAGNOSIS — I2510 Atherosclerotic heart disease of native coronary artery without angina pectoris: Secondary | ICD-10-CM | POA: Diagnosis not present

## 2015-01-13 DIAGNOSIS — Z7901 Long term (current) use of anticoagulants: Secondary | ICD-10-CM | POA: Diagnosis not present

## 2015-01-13 DIAGNOSIS — N183 Chronic kidney disease, stage 3 (moderate): Secondary | ICD-10-CM | POA: Diagnosis not present

## 2015-01-13 DIAGNOSIS — I252 Old myocardial infarction: Secondary | ICD-10-CM | POA: Diagnosis not present

## 2015-01-13 DIAGNOSIS — I10 Essential (primary) hypertension: Secondary | ICD-10-CM | POA: Diagnosis not present

## 2015-01-13 DIAGNOSIS — Z6831 Body mass index (BMI) 31.0-31.9, adult: Secondary | ICD-10-CM | POA: Diagnosis not present

## 2015-01-13 DIAGNOSIS — E538 Deficiency of other specified B group vitamins: Secondary | ICD-10-CM | POA: Diagnosis not present

## 2015-01-13 DIAGNOSIS — E1129 Type 2 diabetes mellitus with other diabetic kidney complication: Secondary | ICD-10-CM | POA: Diagnosis not present

## 2015-02-09 DIAGNOSIS — Z85820 Personal history of malignant melanoma of skin: Secondary | ICD-10-CM | POA: Diagnosis not present

## 2015-02-09 DIAGNOSIS — L820 Inflamed seborrheic keratosis: Secondary | ICD-10-CM | POA: Diagnosis not present

## 2015-02-09 DIAGNOSIS — L570 Actinic keratosis: Secondary | ICD-10-CM | POA: Diagnosis not present

## 2015-02-09 DIAGNOSIS — Z85828 Personal history of other malignant neoplasm of skin: Secondary | ICD-10-CM | POA: Diagnosis not present

## 2015-02-17 DIAGNOSIS — Z7901 Long term (current) use of anticoagulants: Secondary | ICD-10-CM | POA: Diagnosis not present

## 2015-02-17 DIAGNOSIS — E538 Deficiency of other specified B group vitamins: Secondary | ICD-10-CM | POA: Diagnosis not present

## 2015-02-17 DIAGNOSIS — I82509 Chronic embolism and thrombosis of unspecified deep veins of unspecified lower extremity: Secondary | ICD-10-CM | POA: Diagnosis not present

## 2015-02-17 DIAGNOSIS — Z23 Encounter for immunization: Secondary | ICD-10-CM | POA: Diagnosis not present

## 2015-02-23 ENCOUNTER — Ambulatory Visit: Payer: Medicare Other | Admitting: Internal Medicine

## 2015-02-25 ENCOUNTER — Encounter: Payer: Self-pay | Admitting: Internal Medicine

## 2015-02-25 ENCOUNTER — Ambulatory Visit (INDEPENDENT_AMBULATORY_CARE_PROVIDER_SITE_OTHER): Payer: Medicare Other | Admitting: Internal Medicine

## 2015-02-25 VITALS — BP 120/62 | HR 58 | Ht 72.0 in | Wt 222.6 lb

## 2015-02-25 DIAGNOSIS — G4733 Obstructive sleep apnea (adult) (pediatric): Secondary | ICD-10-CM | POA: Diagnosis not present

## 2015-02-25 DIAGNOSIS — I82409 Acute embolism and thrombosis of unspecified deep veins of unspecified lower extremity: Secondary | ICD-10-CM | POA: Diagnosis not present

## 2015-02-25 NOTE — Patient Instructions (Signed)
Order- DME [facility]  Replacement CPAP machine, current pressure, mask of choice, humidifier, supplies, add AirView,       Dx OSA

## 2015-02-25 NOTE — Progress Notes (Signed)
Subjective:    Patient ID: Alejandro Russell, male    DOB: Jun 08, 1927, 81 y.o.   MRN: [id number]  HPI   01/07/14- Dr Gwenette Greet The patient comes in today for followup of his obstructive sleep apnea. He is wearing CPAP compliantly, and feels that he is sleeping well with his device. He is having no issues with his mask fit or pressure. He and his wife both state that his daytime alertness has been adequate.   02/25/15- 81 yoM followed for OSA, complicated by DM 2, HBP, DVT/, CAD     wife here NPSG - 2000, AHI 50/ hr CPAP/[facility], uncertain pressure. Wife confirms he uses it all night every night. It sounds as if it is probably on fixed pressure with a ramp. Fullface mask. Very old machine. Primary physician manages vaccines  ROS-see HPI   Negative unless "+" Constitutional:    weight loss, night sweats, fevers, chills, fatigue, lassitude. HEENT:    headaches, difficulty swallowing, tooth/dental problems, sore throat,       sneezing, itching, ear ache, nasal congestion, post nasal drip, snoring CV:    chest pain, orthopnea, PND, swelling in lower extremities, anasarca,                                                       dizziness, palpitations Resp:   shortness of breath with exertion or at rest.                productive cough,   non-productive cough, coughing up of blood.              change in color of mucus.  wheezing.   Skin:    rash or lesions. GI:  No-   heartburn, indigestion, abdominal pain, nausea, vomiting, GU:  MS:   joint pain, stiffness,  Neuro-     nothing unusual Psych:  change in mood or affect.  depression or anxiety.   memory loss.    Objective:  OBJ- Physical Exam General- Alert, Oriented, Affect-appropriate, Distress- none acute, + obese Skin- rash-none, lesions- none, excoriation- none Lymphadenopathy- none Head- atraumatic            Eyes- Gross vision intact, PERRLA, conjunctivae and secretions clear            Ears- Hearing, canals-normal            Nose-  Clear, no-Septal dev, mucus, polyps, erosion, perforation             Throat- Mallampati III , mucosa clear , drainage- none, tonsils- atrophic, + upper plate,, + very dry Neck- flexible , trachea midline, no stridor , thyroid nl, carotid no bruit Chest - symmetrical excursion , unlabored           Heart/CV- RRR , no murmur , no gallop  , no rub, nl s1 s2                           - JVD- none , edema- none, stasis changes- none, varices- none           Lung- clear to P&A, wheeze- none, cough- none , dullness-none, rub- none           Chest wall-  Abd-  Br/ Gen/ Rectal- Not done, not indicated Extrem-  cyanosis- none, clubbing, none, atrophy- none, strength- nl Neuro- grossly intact to observation    Assessment & Plan:

## 2015-02-25 NOTE — Assessment & Plan Note (Addendum)
We're contacting [facility] for copy of his diagnostic sleep study. Plan-replacement for old CPAP machine, adding [employer] for documentation. It sounds as if compliance and control are good. Uses Biotene for dry mouth

## 2015-02-25 NOTE — Assessment & Plan Note (Signed)
Now on maintenance anticoagulation managed by his primary physician

## 2015-03-11 ENCOUNTER — Telehealth: Payer: Self-pay | Admitting: *Deleted

## 2015-03-11 ENCOUNTER — Encounter: Payer: Self-pay | Admitting: Podiatry

## 2015-03-11 ENCOUNTER — Telehealth: Payer: Self-pay | Admitting: Internal Medicine

## 2015-03-11 ENCOUNTER — Ambulatory Visit (INDEPENDENT_AMBULATORY_CARE_PROVIDER_SITE_OTHER): Payer: Medicare Other | Admitting: Podiatry

## 2015-03-11 DIAGNOSIS — B351 Tinea unguium: Secondary | ICD-10-CM | POA: Diagnosis not present

## 2015-03-11 DIAGNOSIS — M79673 Pain in unspecified foot: Secondary | ICD-10-CM

## 2015-03-11 NOTE — Progress Notes (Signed)
Patient ID: Alejandro Russell, male   DOB: 05/19/1928, 81 y.o.   MRN: [id number] Complaint:  Visit Type: Patient returns to my office for continued preventative foot care services. Complaint: Patient states" my nails have grown long and thick and become painful to walk and wear shoes" Patient has been diagnosed with pre Diabetes.. He presents for preventative foot care services. No changes to ROS  Podiatric Exam: Vascular: dorsalis pedis and posterior tibial pulses are palpable bilateral. Capillary return is immediate. Temperature gradient is WNL. Skin turgor WNL  Sensorium: Normal Semmes Weinstein monofilament test. Normal tactile sensation bilaterally. Nail Exam: Pt has thick disfigured discolored nails with subungual debris noted bilateral entire nail hallux through fifth toenails Ulcer Exam: There is no evidence of ulcer or pre-ulcerative changes or infection. Orthopedic Exam: Muscle tone and strength are WNL. No limitations in general ROM. No crepitus or effusions noted. Foot type and digits show no abnormalities. Bony prominences are unremarkable. Skin: No Porokeratosis. No infection or ulcers  Diagnosis:  Tinea unguium, Pain in right toe, pain in left toes  Treatment & Plan Procedures and Treatment: Consent by patient was obtained for treatment procedures. The patient understood the discussion of treatment and procedures well. All questions were answered thoroughly reviewed. Debridement of mycotic and hypertrophic toenails, 1 through 5 bilateral and clearing of subungual debris. No ulceration, no infection noted.  Return Visit-Office Procedure: Patient instructed to return to the office for a follow up visit 3 months for continued evaluation and treatment. 

## 2015-03-11 NOTE — Telephone Encounter (Signed)
-----   Message from Deno Lunger sent at 03/11/2015  4:11 PM EDT ----- Regarding: New CPAP for Pt Alejandro Russell - wanted to give you an update on CPAP Rx.  Pt was contacted by me on 02/25/15 the day we received order to advise I needed to have the billing on his old cpap stopped in order to process new rx.  Billing as of today does show it is clear......contacted our [facility] to expedite shipping for the Pt's new machine to be setup.  Will contact Pt to advise the status as well.  Darnelle Spangle [facility]

## 2015-03-11 NOTE — Telephone Encounter (Signed)
Patient Returned call [phone]

## 2015-03-11 NOTE — Telephone Encounter (Signed)
Per Alejandro Russell, pt is questioning how long he saw Dr. Gwenette Greet and how long has been on CPAP therapy.    [place]            He goes back all the way to 2000 when we started him with pap under Dr Gwenette Greet. We show him at a pressure of 16 on a cmn that Dr Gwenette Greet signed on 10/2009. Looks like he has an Educational psychologist, so if Dr Annamaria Boots wants a download on current machine, we will need to bring him in for download, as he cannot be linked to [facility]. Hope this helps!   Mandy at [facility] for pt to inform him of the information he requested.

## 2015-03-11 NOTE — Telephone Encounter (Signed)
Order was entered on 02/25/15 for new CPAP machine, Order was confirmed by [facility] on 02/26/15, however, pt has not heard anything from [facility].  [facility] sent message to [facility] [place] stating that they needed download from pt's old machine.  Spoke with patient, he said that he can bring the CPAP machine by [facility] on [address] today to obtain download.  Called [facility] and spoke with Estill Bamberg, she said that there are no respiratory therapists in the office today, so pt can not come by there today to get download.  She said she spoke with Rodena Piety (who is processing the order) and that Rodena Piety advised her that she is still working on the order and that she will contact the patient before the day is out.  I called pt back and advised him that he will receive call from Morgan at [facility] to get the CPAP processed.  Pt verbalized understanding. Nothing further needed.

## 2015-03-11 NOTE — Telephone Encounter (Signed)
Per Rodena Piety from [facility] - see below staff message

## 2015-03-24 DIAGNOSIS — Z7901 Long term (current) use of anticoagulants: Secondary | ICD-10-CM | POA: Diagnosis not present

## 2015-03-24 DIAGNOSIS — I82509 Chronic embolism and thrombosis of unspecified deep veins of unspecified lower extremity: Secondary | ICD-10-CM | POA: Diagnosis not present

## 2015-03-24 DIAGNOSIS — E538 Deficiency of other specified B group vitamins: Secondary | ICD-10-CM | POA: Diagnosis not present

## 2015-04-29 DIAGNOSIS — I82509 Chronic embolism and thrombosis of unspecified deep veins of unspecified lower extremity: Secondary | ICD-10-CM | POA: Diagnosis not present

## 2015-04-29 DIAGNOSIS — E538 Deficiency of other specified B group vitamins: Secondary | ICD-10-CM | POA: Diagnosis not present

## 2015-04-29 DIAGNOSIS — Z7901 Long term (current) use of anticoagulants: Secondary | ICD-10-CM | POA: Diagnosis not present

## 2015-06-01 DIAGNOSIS — Z7901 Long term (current) use of anticoagulants: Secondary | ICD-10-CM | POA: Diagnosis not present

## 2015-06-01 DIAGNOSIS — I82509 Chronic embolism and thrombosis of unspecified deep veins of unspecified lower extremity: Secondary | ICD-10-CM | POA: Diagnosis not present

## 2015-06-10 DIAGNOSIS — E784 Other hyperlipidemia: Secondary | ICD-10-CM | POA: Diagnosis not present

## 2015-06-10 DIAGNOSIS — N183 Chronic kidney disease, stage 3 (moderate): Secondary | ICD-10-CM | POA: Diagnosis not present

## 2015-06-10 DIAGNOSIS — E559 Vitamin D deficiency, unspecified: Secondary | ICD-10-CM | POA: Diagnosis not present

## 2015-06-10 DIAGNOSIS — E1129 Type 2 diabetes mellitus with other diabetic kidney complication: Secondary | ICD-10-CM | POA: Diagnosis not present

## 2015-06-10 DIAGNOSIS — E538 Deficiency of other specified B group vitamins: Secondary | ICD-10-CM | POA: Diagnosis not present

## 2015-06-16 DIAGNOSIS — L814 Other melanin hyperpigmentation: Secondary | ICD-10-CM | POA: Diagnosis not present

## 2015-06-16 DIAGNOSIS — D225 Melanocytic nevi of trunk: Secondary | ICD-10-CM | POA: Diagnosis not present

## 2015-06-16 DIAGNOSIS — Z85828 Personal history of other malignant neoplasm of skin: Secondary | ICD-10-CM | POA: Diagnosis not present

## 2015-06-16 DIAGNOSIS — C4441 Basal cell carcinoma of skin of scalp and neck: Secondary | ICD-10-CM | POA: Diagnosis not present

## 2015-06-16 DIAGNOSIS — L570 Actinic keratosis: Secondary | ICD-10-CM | POA: Diagnosis not present

## 2015-06-16 DIAGNOSIS — D1801 Hemangioma of skin and subcutaneous tissue: Secondary | ICD-10-CM | POA: Diagnosis not present

## 2015-06-16 DIAGNOSIS — Z85820 Personal history of malignant melanoma of skin: Secondary | ICD-10-CM | POA: Diagnosis not present

## 2015-06-16 DIAGNOSIS — D485 Neoplasm of uncertain behavior of skin: Secondary | ICD-10-CM | POA: Diagnosis not present

## 2015-06-16 DIAGNOSIS — L821 Other seborrheic keratosis: Secondary | ICD-10-CM | POA: Diagnosis not present

## 2015-06-16 DIAGNOSIS — L738 Other specified follicular disorders: Secondary | ICD-10-CM | POA: Diagnosis not present

## 2015-06-17 ENCOUNTER — Ambulatory Visit (INDEPENDENT_AMBULATORY_CARE_PROVIDER_SITE_OTHER): Payer: Medicare Other | Admitting: Podiatry

## 2015-06-17 ENCOUNTER — Encounter: Payer: Self-pay | Admitting: Podiatry

## 2015-06-17 DIAGNOSIS — Z1389 Encounter for screening for other disorder: Secondary | ICD-10-CM | POA: Diagnosis not present

## 2015-06-17 DIAGNOSIS — E1140 Type 2 diabetes mellitus with diabetic neuropathy, unspecified: Secondary | ICD-10-CM | POA: Diagnosis not present

## 2015-06-17 DIAGNOSIS — B351 Tinea unguium: Secondary | ICD-10-CM | POA: Diagnosis not present

## 2015-06-17 DIAGNOSIS — N183 Chronic kidney disease, stage 3 (moderate): Secondary | ICD-10-CM | POA: Diagnosis not present

## 2015-06-17 DIAGNOSIS — Z7901 Long term (current) use of anticoagulants: Secondary | ICD-10-CM | POA: Diagnosis not present

## 2015-06-17 DIAGNOSIS — D6489 Other specified anemias: Secondary | ICD-10-CM | POA: Diagnosis not present

## 2015-06-17 DIAGNOSIS — Z0000 Encounter for general adult medical examination without abnormal findings: Secondary | ICD-10-CM | POA: Diagnosis not present

## 2015-06-17 DIAGNOSIS — M79673 Pain in unspecified foot: Secondary | ICD-10-CM | POA: Diagnosis not present

## 2015-06-17 DIAGNOSIS — Z6830 Body mass index (BMI) 30.0-30.9, adult: Secondary | ICD-10-CM | POA: Diagnosis not present

## 2015-06-17 DIAGNOSIS — I2510 Atherosclerotic heart disease of native coronary artery without angina pectoris: Secondary | ICD-10-CM | POA: Diagnosis not present

## 2015-06-17 DIAGNOSIS — E784 Other hyperlipidemia: Secondary | ICD-10-CM | POA: Diagnosis not present

## 2015-06-17 DIAGNOSIS — I10 Essential (primary) hypertension: Secondary | ICD-10-CM | POA: Diagnosis not present

## 2015-06-17 DIAGNOSIS — E1129 Type 2 diabetes mellitus with other diabetic kidney complication: Secondary | ICD-10-CM | POA: Diagnosis not present

## 2015-06-17 DIAGNOSIS — Z23 Encounter for immunization: Secondary | ICD-10-CM | POA: Diagnosis not present

## 2015-06-17 NOTE — Progress Notes (Signed)
Patient ID: DEUNTA GEISSLER, male   DOB: 12/28/1927, 82 y.o.   MRN: [id number] Complaint:  Visit Type: Patient returns to my office for continued preventative foot care services. Complaint: Patient states" my nails have grown long and thick and become painful to walk and wear shoes" Patient has been diagnosed with pre Diabetes.Marland Kitchen He presents for preventative foot care services. No changes to ROS  Podiatric Exam: Vascular: dorsalis pedis and posterior tibial pulses are palpable bilateral. Capillary return is immediate. Temperature gradient is WNL. Skin turgor WNL  Sensorium: Normal Semmes Weinstein monofilament test. Normal tactile sensation bilaterally. Nail Exam: Pt has thick disfigured discolored nails with subungual debris noted bilateral entire nail hallux through fifth toenails Ulcer Exam: There is no evidence of ulcer or pre-ulcerative changes or infection. Orthopedic Exam: Muscle tone and strength are WNL. No limitations in general ROM. No crepitus or effusions noted. Foot type and digits show no abnormalities. Bony prominences are unremarkable. Skin: No Porokeratosis. No infection or ulcers  Diagnosis:  Tinea unguium, Pain in right toe, pain in left toes  Treatment & Plan Procedures and Treatment: Consent by patient was obtained for treatment procedures. The patient understood the discussion of treatment and procedures well. All questions were answered thoroughly reviewed. Debridement of mycotic and hypertrophic toenails, 1 through 5 bilateral and clearing of subungual debris. No ulceration, no infection noted.  Return Visit-Office Procedure: Patient instructed to return to the office for a follow up visit 3 months for continued evaluation and treatment.   Gardiner Barefoot DPM

## 2015-07-15 DIAGNOSIS — N183 Chronic kidney disease, stage 3 (moderate): Secondary | ICD-10-CM | POA: Diagnosis not present

## 2015-07-15 DIAGNOSIS — E538 Deficiency of other specified B group vitamins: Secondary | ICD-10-CM | POA: Diagnosis not present

## 2015-07-15 DIAGNOSIS — I82509 Chronic embolism and thrombosis of unspecified deep veins of unspecified lower extremity: Secondary | ICD-10-CM | POA: Diagnosis not present

## 2015-07-15 DIAGNOSIS — Z7901 Long term (current) use of anticoagulants: Secondary | ICD-10-CM | POA: Diagnosis not present

## 2015-07-15 DIAGNOSIS — E1129 Type 2 diabetes mellitus with other diabetic kidney complication: Secondary | ICD-10-CM | POA: Diagnosis not present

## 2015-08-11 DIAGNOSIS — I82509 Chronic embolism and thrombosis of unspecified deep veins of unspecified lower extremity: Secondary | ICD-10-CM | POA: Diagnosis not present

## 2015-08-11 DIAGNOSIS — Z7901 Long term (current) use of anticoagulants: Secondary | ICD-10-CM | POA: Diagnosis not present

## 2015-08-11 DIAGNOSIS — E538 Deficiency of other specified B group vitamins: Secondary | ICD-10-CM | POA: Diagnosis not present

## 2015-09-01 DIAGNOSIS — Z6829 Body mass index (BMI) 29.0-29.9, adult: Secondary | ICD-10-CM | POA: Diagnosis not present

## 2015-09-01 DIAGNOSIS — H00025 Hordeolum internum left lower eyelid: Secondary | ICD-10-CM | POA: Diagnosis not present

## 2015-09-02 DIAGNOSIS — H00025 Hordeolum internum left lower eyelid: Secondary | ICD-10-CM | POA: Diagnosis not present

## 2015-09-02 DIAGNOSIS — H00022 Hordeolum internum right lower eyelid: Secondary | ICD-10-CM | POA: Diagnosis not present

## 2015-09-15 DIAGNOSIS — I82509 Chronic embolism and thrombosis of unspecified deep veins of unspecified lower extremity: Secondary | ICD-10-CM | POA: Diagnosis not present

## 2015-09-15 DIAGNOSIS — Z7901 Long term (current) use of anticoagulants: Secondary | ICD-10-CM | POA: Diagnosis not present

## 2015-09-15 DIAGNOSIS — E538 Deficiency of other specified B group vitamins: Secondary | ICD-10-CM | POA: Diagnosis not present

## 2015-09-16 ENCOUNTER — Encounter: Payer: Self-pay | Admitting: Podiatry

## 2015-09-16 ENCOUNTER — Ambulatory Visit (INDEPENDENT_AMBULATORY_CARE_PROVIDER_SITE_OTHER): Payer: Medicare Other | Admitting: Podiatry

## 2015-09-16 DIAGNOSIS — B351 Tinea unguium: Secondary | ICD-10-CM

## 2015-09-16 DIAGNOSIS — M79673 Pain in unspecified foot: Secondary | ICD-10-CM

## 2015-09-16 NOTE — Progress Notes (Signed)
Patient ID: Alejandro Russell, male   DOB: [birth year], 82 y.o.   MRN: [id number] Complaint:  Visit Type: Patient returns to my office for continued preventative foot care services. Complaint: Patient states" my nails have grown long and thick and become painful to walk and wear shoes" Patient has been diagnosed with pre Diabetes.Marland Kitchen He presents for preventative foot care services. No changes to ROS  Podiatric Exam: Vascular: dorsalis pedis and posterior tibial pulses are palpable bilateral. Capillary return is immediate. Temperature gradient is WNL. Skin turgor WNL  Sensorium: Normal Semmes Weinstein monofilament test. Normal tactile sensation bilaterally. Nail Exam: Pt has thick disfigured discolored nails with subungual debris noted bilateral entire nail hallux through fifth toenails Ulcer Exam: There is no evidence of ulcer or pre-ulcerative changes or infection. Orthopedic Exam: Muscle tone and strength are WNL. No limitations in general ROM. No crepitus or effusions noted. Foot type and digits show no abnormalities. Bony prominences are unremarkable. Skin: No Porokeratosis. No infection or ulcers  Diagnosis:  Tinea unguium, Pain in right toe, pain in left toes  Treatment & Plan Procedures and Treatment: Consent by patient was obtained for treatment procedures. The patient understood the discussion of treatment and procedures well. All questions were answered thoroughly reviewed. Debridement of mycotic and hypertrophic toenails, 1 through 5 bilateral and clearing of subungual debris. No ulceration, no infection noted.  Return Visit-Office Procedure: Patient instructed to return to the office for a follow up visit 3 months for continued evaluation and treatment.   Gardiner Barefoot DPM

## 2015-10-13 DIAGNOSIS — Z85820 Personal history of malignant melanoma of skin: Secondary | ICD-10-CM | POA: Diagnosis not present

## 2015-10-13 DIAGNOSIS — L570 Actinic keratosis: Secondary | ICD-10-CM | POA: Diagnosis not present

## 2015-10-13 DIAGNOSIS — L718 Other rosacea: Secondary | ICD-10-CM | POA: Diagnosis not present

## 2015-10-13 DIAGNOSIS — Z85828 Personal history of other malignant neoplasm of skin: Secondary | ICD-10-CM | POA: Diagnosis not present

## 2015-10-14 DIAGNOSIS — E538 Deficiency of other specified B group vitamins: Secondary | ICD-10-CM | POA: Diagnosis not present

## 2015-10-14 DIAGNOSIS — Z7901 Long term (current) use of anticoagulants: Secondary | ICD-10-CM | POA: Diagnosis not present

## 2015-10-14 DIAGNOSIS — I82509 Chronic embolism and thrombosis of unspecified deep veins of unspecified lower extremity: Secondary | ICD-10-CM | POA: Diagnosis not present

## 2015-11-11 DIAGNOSIS — E538 Deficiency of other specified B group vitamins: Secondary | ICD-10-CM | POA: Diagnosis not present

## 2015-11-11 DIAGNOSIS — I82509 Chronic embolism and thrombosis of unspecified deep veins of unspecified lower extremity: Secondary | ICD-10-CM | POA: Diagnosis not present

## 2015-11-11 DIAGNOSIS — Z7901 Long term (current) use of anticoagulants: Secondary | ICD-10-CM | POA: Diagnosis not present

## 2015-12-10 DIAGNOSIS — Z7901 Long term (current) use of anticoagulants: Secondary | ICD-10-CM | POA: Diagnosis not present

## 2015-12-10 DIAGNOSIS — E538 Deficiency of other specified B group vitamins: Secondary | ICD-10-CM | POA: Diagnosis not present

## 2015-12-10 DIAGNOSIS — I82509 Chronic embolism and thrombosis of unspecified deep veins of unspecified lower extremity: Secondary | ICD-10-CM | POA: Diagnosis not present

## 2015-12-16 ENCOUNTER — Ambulatory Visit (INDEPENDENT_AMBULATORY_CARE_PROVIDER_SITE_OTHER): Payer: Medicare Other | Admitting: Podiatry

## 2015-12-16 DIAGNOSIS — M79673 Pain in unspecified foot: Secondary | ICD-10-CM | POA: Diagnosis not present

## 2015-12-16 DIAGNOSIS — B351 Tinea unguium: Secondary | ICD-10-CM

## 2015-12-16 NOTE — Progress Notes (Signed)
Patient ID: Alejandro Russell, male   DOB: 05/18/1928, 82 y.o.   MRN: [id number] Complaint:  Visit Type: Patient returns to my office for continued preventative foot care services. Complaint: Patient states" my nails have grown long and thick and become painful to walk and wear shoes" Patient has been diagnosed with pre Diabetes.. He presents for preventative foot care services. No changes to ROS  Podiatric Exam: Vascular: dorsalis pedis and posterior tibial pulses are palpable bilateral. Capillary return is immediate. Temperature gradient is WNL. Skin turgor WNL  Sensorium: Normal Semmes Weinstein monofilament test. Normal tactile sensation bilaterally. Nail Exam: Pt has thick disfigured discolored nails with subungual debris noted bilateral entire nail hallux through fifth toenails Ulcer Exam: There is no evidence of ulcer or pre-ulcerative changes or infection. Orthopedic Exam: Muscle tone and strength are WNL. No limitations in general ROM. No crepitus or effusions noted. Foot type and digits show no abnormalities. Bony prominences are unremarkable. Skin: No Porokeratosis. No infection or ulcers  Diagnosis:  Tinea unguium, Pain in right toe, pain in left toes  Treatment & Plan Procedures and Treatment: Consent by patient was obtained for treatment procedures. The patient understood the discussion of treatment and procedures well. All questions were answered thoroughly reviewed. Debridement of mycotic and hypertrophic toenails, 1 through 5 bilateral and clearing of subungual debris. No ulceration, no infection noted.  Return Visit-Office Procedure: Patient instructed to return to the office for a follow up visit 3 months for continued evaluation and treatment.   Alejandro Russell DPM 

## 2016-01-18 DIAGNOSIS — E538 Deficiency of other specified B group vitamins: Secondary | ICD-10-CM | POA: Diagnosis not present

## 2016-01-18 DIAGNOSIS — Z7901 Long term (current) use of anticoagulants: Secondary | ICD-10-CM | POA: Diagnosis not present

## 2016-01-18 DIAGNOSIS — I82509 Chronic embolism and thrombosis of unspecified deep veins of unspecified lower extremity: Secondary | ICD-10-CM | POA: Diagnosis not present

## 2016-01-26 DIAGNOSIS — L821 Other seborrheic keratosis: Secondary | ICD-10-CM | POA: Diagnosis not present

## 2016-01-26 DIAGNOSIS — L718 Other rosacea: Secondary | ICD-10-CM | POA: Diagnosis not present

## 2016-01-26 DIAGNOSIS — L570 Actinic keratosis: Secondary | ICD-10-CM | POA: Diagnosis not present

## 2016-01-26 DIAGNOSIS — L814 Other melanin hyperpigmentation: Secondary | ICD-10-CM | POA: Diagnosis not present

## 2016-01-26 DIAGNOSIS — Z85820 Personal history of malignant melanoma of skin: Secondary | ICD-10-CM | POA: Diagnosis not present

## 2016-01-26 DIAGNOSIS — Z85828 Personal history of other malignant neoplasm of skin: Secondary | ICD-10-CM | POA: Diagnosis not present

## 2016-02-23 DIAGNOSIS — I82509 Chronic embolism and thrombosis of unspecified deep veins of unspecified lower extremity: Secondary | ICD-10-CM | POA: Diagnosis not present

## 2016-02-23 DIAGNOSIS — E538 Deficiency of other specified B group vitamins: Secondary | ICD-10-CM | POA: Diagnosis not present

## 2016-02-23 DIAGNOSIS — Z7901 Long term (current) use of anticoagulants: Secondary | ICD-10-CM | POA: Diagnosis not present

## 2016-02-24 DIAGNOSIS — Z23 Encounter for immunization: Secondary | ICD-10-CM | POA: Diagnosis not present

## 2016-02-24 DIAGNOSIS — E538 Deficiency of other specified B group vitamins: Secondary | ICD-10-CM | POA: Diagnosis not present

## 2016-03-02 ENCOUNTER — Ambulatory Visit (INDEPENDENT_AMBULATORY_CARE_PROVIDER_SITE_OTHER): Payer: Medicare Other | Admitting: Internal Medicine

## 2016-03-02 ENCOUNTER — Encounter: Payer: Self-pay | Admitting: Internal Medicine

## 2016-03-02 VITALS — BP 110/50 | HR 57 | Ht 72.0 in | Wt 210.0 lb

## 2016-03-02 DIAGNOSIS — G4733 Obstructive sleep apnea (adult) (pediatric): Secondary | ICD-10-CM | POA: Diagnosis not present

## 2016-03-02 NOTE — Assessment & Plan Note (Signed)
Good compliance and control described. He needs to get mask fit reassessed by [facility]. I suggested wife go ahead and wake him if nec to adjust mask. P- requesting download, AirView

## 2016-03-02 NOTE — Patient Instructions (Signed)
Ok to continue CPAP/ [facility], mask of choice, humidifier, supplies AirView   Dx OSA  Ok to ask [facility] to help you look at different mask styles to see if you find one that fits and seals a little better.  Please call if we can help

## 2016-03-02 NOTE — Progress Notes (Signed)
Subjective:    Patient ID: Alejandro Russell, male    DOB: [birth year], 82 y.o.   MRN: [id number]  HPI   01/07/14- Dr Gwenette Greet The patient comes in today for followup of his obstructive sleep apnea. He is wearing CPAP compliantly, and feels that he is sleeping well with his device. He is having no issues with his mask fit or pressure. He and his wife both state that his daytime alertness has been adequate.   02/25/15- 42 yoM followed for OSA, complicated by DM 2, HBP, DVT/, CAD     wife here NPSG - 2000, AHI 50/ hr CPAP/[facility], uncertain pressure. Wife confirms he uses it all night every night. It sounds as if it is probably on fixed pressure with a ramp. Fullface mask. Very old machine. Primary physician manages vaccines  03/02/2016-82 year old male former smoker followed for OSA, complicated by DM 2, HBP, DVT/CAD, DM 2 FOLLOWS FOR: DME: [facility]. Pt wears CPAP nightly for about 8- 9 hours; mask does not fit as well as it should per wife.  No DL and not in AV.  CPAP 16/ [facility]  Wife here. She goes to different bedroom if his mask slips and he begins to snore, rather than waking him. Got new CPAP machine last year. Uses every night. Still needs occasional nap.  ROS-see HPI   Negative unless "+" Constitutional:    weight loss, night sweats, fevers, chills, fatigue, lassitude. HEENT:    headaches, difficulty swallowing, tooth/dental problems, sore throat,       sneezing, itching, ear ache, nasal congestion, post nasal drip, snoring CV:    chest pain, orthopnea, PND, swelling in lower extremities, anasarca,                                                       dizziness, palpitations Resp:   shortness of breath with exertion or at rest.                productive cough,   non-productive cough, coughing up of blood.              change in color of mucus.  wheezing.   Skin:    rash or lesions. GI:  No-   heartburn, indigestion, abdominal pain, nausea, vomiting, GU:  MS:   joint pain, stiffness,    Neuro-     nothing unusual Psych:  change in mood or affect.  depression or anxiety.   memory loss.    Objective:  OBJ- Physical Exam General- Alert, Oriented, Affect-appropriate, Distress- none acute, + obese, + elerly Skin- rash-none, lesions- none, excoriation- none Lymphadenopathy- none Head- atraumatic            Eyes- Gross vision intact, PERRLA, conjunctivae and secretions clear            Ears- Hearing, canals-normal            Nose- Clear, no-Septal dev, mucus, polyps, erosion, perforation             Throat- Mallampati III , mucosa clear , drainage- none, tonsils- atrophic, + upper plate, + very dry Neck- flexible , trachea midline, no stridor , thyroid nl, carotid no bruit Chest - symmetrical excursion , unlabored           Heart/CV- RRR , no murmur , no gallop  ,  no rub, nl s1 s2                           - JVD- none , edema- none, stasis changes- none, varices- none           Lung- clear to P&A, wheeze- none, cough- none , dullness-none, rub- none           Chest wall-  Abd-  Br/ Gen/ Rectal- Not done, not indicated Extrem- cyanosis- none, clubbing, none, atrophy- none, strength- nl Neuro- grossly intact to observation    Assessment & Plan:

## 2016-03-09 ENCOUNTER — Ambulatory Visit (INDEPENDENT_AMBULATORY_CARE_PROVIDER_SITE_OTHER): Payer: Medicare Other | Admitting: Podiatry

## 2016-03-09 ENCOUNTER — Encounter: Payer: Self-pay | Admitting: Podiatry

## 2016-03-09 VITALS — Ht 72.0 in | Wt 210.0 lb

## 2016-03-09 DIAGNOSIS — M79676 Pain in unspecified toe(s): Secondary | ICD-10-CM | POA: Diagnosis not present

## 2016-03-09 DIAGNOSIS — B351 Tinea unguium: Secondary | ICD-10-CM

## 2016-03-09 NOTE — Progress Notes (Signed)
Patient ID: Alejandro Russell, male   DOB: 11/02/1927, 82 y.o.   MRN: [id number] Complaint:  Visit Type: Patient returns to my office for continued preventative foot care services. Complaint: Patient states" my nails have grown long and thick and become painful to walk and wear shoes" Patient has been diagnosed with pre Diabetes.. He presents for preventative foot care services. No changes to ROS  Podiatric Exam: Vascular: dorsalis pedis and posterior tibial pulses are palpable bilateral. Capillary return is immediate. Temperature gradient is WNL. Skin turgor WNL  Sensorium: Normal Semmes Weinstein monofilament test. Normal tactile sensation bilaterally. Nail Exam: Pt has thick disfigured discolored nails with subungual debris noted bilateral entire nail hallux through fifth toenails Ulcer Exam: There is no evidence of ulcer or pre-ulcerative changes or infection. Orthopedic Exam: Muscle tone and strength are WNL. No limitations in general ROM. No crepitus or effusions noted. Foot type and digits show no abnormalities. Bony prominences are unremarkable. Skin: No Porokeratosis. No infection or ulcers  Diagnosis:  Tinea unguium, Pain in right toe, pain in left toes  Treatment & Plan Procedures and Treatment: Consent by patient was obtained for treatment procedures. The patient understood the discussion of treatment and procedures well. All questions were answered thoroughly reviewed. Debridement of mycotic and hypertrophic toenails, 1 through 5 bilateral and clearing of subungual debris. No ulceration, no infection noted.  Return Visit-Office Procedure: Patient instructed to return to the office for a follow up visit 3 months for continued evaluation and treatment.   Seven Dollens DPM 

## 2016-03-23 DIAGNOSIS — Z7901 Long term (current) use of anticoagulants: Secondary | ICD-10-CM | POA: Diagnosis not present

## 2016-03-23 DIAGNOSIS — I82509 Chronic embolism and thrombosis of unspecified deep veins of unspecified lower extremity: Secondary | ICD-10-CM | POA: Diagnosis not present

## 2016-03-23 DIAGNOSIS — E538 Deficiency of other specified B group vitamins: Secondary | ICD-10-CM | POA: Diagnosis not present

## 2016-04-24 DIAGNOSIS — Z7901 Long term (current) use of anticoagulants: Secondary | ICD-10-CM | POA: Diagnosis not present

## 2016-04-24 DIAGNOSIS — I82509 Chronic embolism and thrombosis of unspecified deep veins of unspecified lower extremity: Secondary | ICD-10-CM | POA: Diagnosis not present

## 2016-04-24 DIAGNOSIS — E538 Deficiency of other specified B group vitamins: Secondary | ICD-10-CM | POA: Diagnosis not present

## 2016-05-16 DIAGNOSIS — I82509 Chronic embolism and thrombosis of unspecified deep veins of unspecified lower extremity: Secondary | ICD-10-CM | POA: Diagnosis not present

## 2016-05-16 DIAGNOSIS — Z7901 Long term (current) use of anticoagulants: Secondary | ICD-10-CM | POA: Diagnosis not present

## 2016-05-16 DIAGNOSIS — E538 Deficiency of other specified B group vitamins: Secondary | ICD-10-CM | POA: Diagnosis not present

## 2016-05-22 ENCOUNTER — Emergency Department (HOSPITAL_COMMUNITY): Payer: Medicare Other

## 2016-05-22 ENCOUNTER — Encounter (HOSPITAL_COMMUNITY): Payer: Self-pay | Admitting: Emergency Medicine

## 2016-05-22 ENCOUNTER — Inpatient Hospital Stay (HOSPITAL_COMMUNITY)
Admission: EM | Admit: 2016-05-22 | Discharge: 2016-05-25 | DRG: 083 | Disposition: A | Discharge status: Home / Self Care | Payer: Medicare Other | Attending: Nephrology | Admitting: Nephrology

## 2016-05-22 DIAGNOSIS — Z7901 Long term (current) use of anticoagulants: Secondary | ICD-10-CM | POA: Diagnosis not present

## 2016-05-22 DIAGNOSIS — W19XXXA Unspecified fall, initial encounter: Secondary | ICD-10-CM

## 2016-05-22 DIAGNOSIS — Z7982 Long term (current) use of aspirin: Secondary | ICD-10-CM

## 2016-05-22 DIAGNOSIS — S80211A Abrasion, right knee, initial encounter: Secondary | ICD-10-CM | POA: Diagnosis not present

## 2016-05-22 DIAGNOSIS — T45515A Adverse effect of anticoagulants, initial encounter: Secondary | ICD-10-CM | POA: Diagnosis present

## 2016-05-22 DIAGNOSIS — F419 Anxiety disorder, unspecified: Secondary | ICD-10-CM | POA: Diagnosis present

## 2016-05-22 DIAGNOSIS — Z86718 Personal history of other venous thrombosis and embolism: Secondary | ICD-10-CM | POA: Diagnosis not present

## 2016-05-22 DIAGNOSIS — S0990XA Unspecified injury of head, initial encounter: Secondary | ICD-10-CM | POA: Diagnosis not present

## 2016-05-22 DIAGNOSIS — S199XXA Unspecified injury of neck, initial encounter: Secondary | ICD-10-CM | POA: Diagnosis not present

## 2016-05-22 DIAGNOSIS — E1122 Type 2 diabetes mellitus with diabetic chronic kidney disease: Secondary | ICD-10-CM | POA: Diagnosis not present

## 2016-05-22 DIAGNOSIS — E785 Hyperlipidemia, unspecified: Secondary | ICD-10-CM | POA: Diagnosis not present

## 2016-05-22 DIAGNOSIS — S065X9A Traumatic subdural hemorrhage with loss of consciousness of unspecified duration, initial encounter: Principal | ICD-10-CM | POA: Diagnosis present

## 2016-05-22 DIAGNOSIS — W102XXA Fall (on)(from) incline, initial encounter: Secondary | ICD-10-CM | POA: Diagnosis not present

## 2016-05-22 DIAGNOSIS — Z7984 Long term (current) use of oral hypoglycemic drugs: Secondary | ICD-10-CM

## 2016-05-22 DIAGNOSIS — Z23 Encounter for immunization: Secondary | ICD-10-CM | POA: Diagnosis not present

## 2016-05-22 DIAGNOSIS — G4733 Obstructive sleep apnea (adult) (pediatric): Secondary | ICD-10-CM | POA: Diagnosis not present

## 2016-05-22 DIAGNOSIS — M25561 Pain in right knee: Secondary | ICD-10-CM | POA: Diagnosis not present

## 2016-05-22 DIAGNOSIS — Z87891 Personal history of nicotine dependence: Secondary | ICD-10-CM | POA: Diagnosis not present

## 2016-05-22 DIAGNOSIS — I129 Hypertensive chronic kidney disease with stage 1 through stage 4 chronic kidney disease, or unspecified chronic kidney disease: Secondary | ICD-10-CM | POA: Diagnosis not present

## 2016-05-22 DIAGNOSIS — S065XAA Traumatic subdural hemorrhage with loss of consciousness status unknown, initial encounter: Secondary | ICD-10-CM | POA: Diagnosis present

## 2016-05-22 DIAGNOSIS — I6200 Nontraumatic subdural hemorrhage, unspecified: Secondary | ICD-10-CM | POA: Diagnosis not present

## 2016-05-22 DIAGNOSIS — R791 Abnormal coagulation profile: Secondary | ICD-10-CM | POA: Diagnosis not present

## 2016-05-22 DIAGNOSIS — Z79899 Other long term (current) drug therapy: Secondary | ICD-10-CM

## 2016-05-22 DIAGNOSIS — N183 Chronic kidney disease, stage 3 (moderate): Secondary | ICD-10-CM | POA: Diagnosis present

## 2016-05-22 DIAGNOSIS — S0993XA Unspecified injury of face, initial encounter: Secondary | ICD-10-CM | POA: Diagnosis not present

## 2016-05-22 DIAGNOSIS — S025XXA Fracture of tooth (traumatic), initial encounter for closed fracture: Secondary | ICD-10-CM | POA: Diagnosis present

## 2016-05-22 DIAGNOSIS — I2510 Atherosclerotic heart disease of native coronary artery without angina pectoris: Secondary | ICD-10-CM | POA: Diagnosis not present

## 2016-05-22 DIAGNOSIS — I252 Old myocardial infarction: Secondary | ICD-10-CM

## 2016-05-22 DIAGNOSIS — S01511A Laceration without foreign body of lip, initial encounter: Secondary | ICD-10-CM | POA: Diagnosis present

## 2016-05-22 DIAGNOSIS — Z791 Long term (current) use of non-steroidal anti-inflammatories (NSAID): Secondary | ICD-10-CM | POA: Diagnosis not present

## 2016-05-22 DIAGNOSIS — R220 Localized swelling, mass and lump, head: Secondary | ICD-10-CM | POA: Diagnosis not present

## 2016-05-22 DIAGNOSIS — E1165 Type 2 diabetes mellitus with hyperglycemia: Secondary | ICD-10-CM | POA: Diagnosis present

## 2016-05-22 DIAGNOSIS — F329 Major depressive disorder, single episode, unspecified: Secondary | ICD-10-CM | POA: Diagnosis present

## 2016-05-22 DIAGNOSIS — Z955 Presence of coronary angioplasty implant and graft: Secondary | ICD-10-CM | POA: Diagnosis not present

## 2016-05-22 DIAGNOSIS — D6832 Hemorrhagic disorder due to extrinsic circulating anticoagulants: Secondary | ICD-10-CM | POA: Diagnosis not present

## 2016-05-22 DIAGNOSIS — R739 Hyperglycemia, unspecified: Secondary | ICD-10-CM

## 2016-05-22 DIAGNOSIS — S065X0A Traumatic subdural hemorrhage without loss of consciousness, initial encounter: Secondary | ICD-10-CM | POA: Diagnosis not present

## 2016-05-22 LAB — PROTIME-INR
INR: 3.02
PROTHROMBIN TIME: 32 s — AB (ref 11.4–15.2)

## 2016-05-22 LAB — I-STAT CHEM 8, ED
BUN: 27 mg/dL — ABNORMAL HIGH (ref 6–20)
Calcium, Ion: 1.4 mmol/L (ref 1.15–1.40)
Chloride: 100 mmol/L — ABNORMAL LOW (ref 101–111)
Creatinine, Ser: 1.2 mg/dL (ref 0.61–1.24)
GLUCOSE: 105 mg/dL — AB (ref 65–99)
HEMATOCRIT: 39 % (ref 39.0–52.0)
HEMOGLOBIN: 13.3 g/dL (ref 13.0–17.0)
POTASSIUM: 4.3 mmol/L (ref 3.5–5.1)
SODIUM: 140 mmol/L (ref 135–145)
TCO2: 27 mmol/L (ref 0–100)

## 2016-05-22 MED ORDER — PROTHROMBIN COMPLEX CONC HUMAN 500 UNITS IV KIT
2500.0000 [IU] | PACK | Status: AC
  Administered 2016-05-23: 2500 [IU] via INTRAVENOUS
  Filled 2016-05-22: qty 100

## 2016-05-22 MED ORDER — VITAMIN K1 10 MG/ML IJ SOLN
10.0000 mg | INTRAVENOUS | Status: AC
  Administered 2016-05-23: 10 mg via INTRAVENOUS
  Filled 2016-05-22: qty 1

## 2016-05-22 MED ORDER — LIDOCAINE HCL (PF) 1 % IJ SOLN
30.0000 mL | Freq: Once | INTRAMUSCULAR | Status: AC
  Administered 2016-05-22: 30 mL
  Filled 2016-05-22: qty 30

## 2016-05-22 MED ORDER — TETANUS-DIPHTH-ACELL PERTUSSIS 5-2.5-18.5 LF-MCG/0.5 IM SUSP
0.5000 mL | Freq: Once | INTRAMUSCULAR | Status: AC
  Administered 2016-05-22: 0.5 mL via INTRAMUSCULAR
  Filled 2016-05-22: qty 0.5

## 2016-05-22 NOTE — ED Triage Notes (Signed)
Patient feel tonight going up stairs in [place]. Fall occurred around 1615. Hx coumadin for blood clots. Presents with medial laceration to lower lip, bleeding currently controlled. Abrasion to right knee; currently oozing blood, dressing placed. Also complaining of upper anterior dental pain.

## 2016-05-22 NOTE — ED Provider Notes (Signed)
[facility] DEPT Provider Note   CSN: [id number] Arrival date & time: 05/22/16  2006     History   Chief Complaint Chief Complaint  Patient presents with  . Fall  . Lip Laceration    HPI Alejandro Russell is a 82 y.o. male with a hx of Recurrent DVT, anemia, CAD, MI, diabetes, hyperlipidemia, chronically anticoagulated presents to the Emergency Department complaining of acute, persistent lip laceration and dental pain after fall around 4 PM today. Patient reports he was walking up a set of stone stairs when he tripped and fell forward. He has associated abrasion to the right knee. He denies syncope, vision changes, neck pain, headache, numbness, tingling. Patient reports he was immediately able to get up and walk. They were in [place] when this happened and drove home. Patient reports he presents for repair of the laceration. No aggravating or alleviating factors at this time.    The history is provided by the patient and medical records. No language interpreter was used.    Past Medical History:  Diagnosis Date  . Anemia   . Anxiety   . CAD (coronary artery disease)   . Chronic renal disease, stage III   . Depression   . Diabetes mellitus   . Diverticulosis   . Diverticulosis   . DVT (deep venous thrombosis) (HCC)    on coumadin  . Hyperlipidemia   . Hypertension   . Myocardial infarct   . Nonspecific abnormal finding in stool contents 09/09/2013  . Obesity   . OSA (obstructive sleep apnea)    wears CPAP  . Peripheral neuropathy ([place])   . Psoriasis   . Tubulovillous adenoma of colon 1995  . Type 2 diabetes mellitus ([place])   . Vitamin B12 deficiency     Patient Active Problem List   Diagnosis Date Noted  . Iron deficiency anemia, unspecified 09/09/2013  . Nonspecific abnormal finding in stool contents 09/09/2013  . CRI (chronic renal insufficiency) 09/03/2013  . Hx of adenomatous colonic polyps 09/03/2013  . CAD (coronary artery disease) of artery bypass graft  09/03/2013  . Acute thromboembolism of deep veins of lower extremity ([place]) 11/01/2009  . DIABETES MELLITUS, TYPE II 10/29/2009  . HYPERLIPIDEMIA 10/29/2009  . ANXIETY 10/29/2009  . Obstructive sleep apnea 10/29/2009  . HYPERTENSION 10/29/2009  . DIVERTICULOSIS, COLON 10/29/2009    Past Surgical History:  Procedure Laterality Date  . CARDIAC CATHETERIZATION     many yrs ago with stent placed  . CHOLECYSTECTOMY  may 16,2011  . COLONOSCOPY WITH PROPOFOL N/A 09/09/2013   Procedure: COLONOSCOPY WITH PROPOFOL;  Surgeon: Ladene Artist, MD;  Location: [facility];  Service: Endoscopy;  Laterality: N/A;  . CORONARY STENT PLACEMENT    . ESOPHAGOGASTRODUODENOSCOPY (EGD) WITH PROPOFOL N/A 09/09/2013   Procedure: ESOPHAGOGASTRODUODENOSCOPY (EGD) WITH PROPOFOL;  Surgeon: Ladene Artist, MD;  Location: [facility];  Service: Endoscopy;  Laterality: N/A;  . INGUINAL HERNIA REPAIR         Home Medications    Prior to Admission medications   Medication Sig Start Date End Date Taking? Authorizing Provider  aspirin EC 81 MG tablet Take 81 mg by mouth daily.    Historical Provider, MD  B Complex-C (SUPER B COMPLEX PO) Take 1 capsule by mouth daily.    Historical Provider, MD  buPROPion (WELLBUTRIN XL) 300 MG 24 hr tablet Take 300 mg by mouth every morning.    Historical Provider, MD  cephALEXin (KEFLEX) 500 MG capsule TAKE 1 CAPSULE BY MOUTH TWICE  A DAY AS DIRECTED 08/02/15   Historical Provider, MD  Cholecalciferol (VITAMIN D-3) 5000 UNITS TABS Take 5,000 Units by mouth daily.    Historical Provider, MD  clindamycin (CLEOCIN T) 1 % lotion  06/16/15   Historical Provider, MD  clobetasol (TEMOVATE) 0.05 % external solution Apply 1 application topically as needed (Scalp).     Historical Provider, MD  cyanocobalamin (,VITAMIN B-12,) 1000 MCG/ML injection Inject 1,000 mcg into the muscle every 30 (thirty) days.    Historical Provider, MD  DENTA 5000 PLUS 1.1 % CREA dental cream See admin instructions.  08/26/15   Historical Provider, MD  ergocalciferol (VITAMIN D2) 50000 UNITS capsule Take 50,000 Units by mouth once a week.    Historical Provider, MD  fenofibrate (TRICOR) 145 MG tablet Take 145 mg by mouth daily.      Historical Provider, MD  folic acid (FOLVITE) 1 MG tablet Take 1 mg by mouth daily.      Historical Provider, MD  isosorbide mononitrate (IMDUR) 60 MG 24 hr tablet Take 60 mg by mouth every morning.     Historical Provider, MD  metFORMIN (GLUCOPHAGE) 500 MG tablet Take 500 mg by mouth 2 (two) times daily.      Historical Provider, MD  metoprolol (LOPRESSOR) 100 MG tablet Take 50 mg by mouth 2 (two) times daily.      Historical Provider, MD  niacin (NIASPAN) 1000 MG CR tablet Take 1,000 mg by mouth at bedtime.      Historical Provider, MD  ofloxacin (OCUFLOX) 0.3 % ophthalmic solution  09/02/15   Historical Provider, MD  Omega-3 Fatty Acids (FISH OIL PO) Take 3 capsules by mouth daily.    Historical Provider, MD  PARoxetine (PAXIL) 20 MG tablet Take 20 mg by mouth at bedtime.     Historical Provider, MD  ramipril (ALTACE) 2.5 MG capsule Take 2.5 mg by mouth every morning.     Historical Provider, MD  rosuvastatin (CRESTOR) 20 MG tablet Take 20 mg by mouth daily.      Historical Provider, MD  warfarin (COUMADIN) 3 MG tablet Take 1.5-3 mg by mouth daily. Takes 1.5mg  on mondays and wednesdays. Takes 1 tablet all other days    Historical Provider, MD    Family History Family History  Problem Relation Age of Onset  . Emphysema Mother   . Dementia Father   . Colon cancer Neg Hx   . Esophageal cancer Neg Hx   . Stomach cancer Neg Hx     Social History Social History  Substance Use Topics  . Smoking status: Former Smoker    Packs/day: 2.00    Years: 31.00    Types: Cigarettes    Quit date: 05/29/1978  . Smokeless tobacco: Never Used  . Alcohol use No     Comment: Hx of alcohol abuse     Allergies   Patient has no known allergies.   Review of Systems Review of Systems    HENT: Positive for dental problem.   Skin: Positive for wound.  All other systems reviewed and are negative.    Physical Exam Updated Vital Signs BP 153/72   Pulse 65   Temp 97.5 F (36.4 C) (Oral)   Resp 18   SpO2 95%   Physical Exam  Constitutional: He is oriented to person, place, and time. He appears well-developed and well-nourished. No distress.  HENT:  Head: Normocephalic. Head is with abrasion, with contusion and with laceration.    Mouth/Throat: Uvula is midline and oropharynx  is clear and moist.    Eyes: Conjunctivae and EOM are normal. Pupils are equal, round, and reactive to light. No scleral icterus.  No horizontal, vertical or rotational nystagmus  Neck: Normal range of motion. Neck supple.  Full active and passive ROM without pain No midline or paraspinal tenderness No nuchal rigidity or meningeal signs  Cardiovascular: Normal rate, regular rhythm and intact distal pulses.   Pulmonary/Chest: Effort normal and breath sounds normal. No respiratory distress. He has no wheezes. He has no rales.  Abdominal: Soft. Bowel sounds are normal. There is no tenderness. There is no rebound and no guarding.  Musculoskeletal: Normal range of motion.  Right knee: Ecchymosis and abrasion over the patellar noted. No abnormal patellar movement. Full range of motion. Patient able to weight-bear without difficulty. Patellar tendon intact.  Lymphadenopathy:    He has no cervical adenopathy.  Neurological: He is alert and oriented to person, place, and time. No cranial nerve deficit. He exhibits normal muscle tone. Coordination normal.  Mental Status:  Alert, oriented, thought content appropriate. Speech fluent without evidence of aphasia. Able to follow 2 step commands without difficulty.  Cranial Nerves:  II:  pupils equal, round, reactive to light III,IV, VI: ptosis not present, extra-ocular motions intact bilaterally  V,VII: smile symmetric, facial light touch sensation  equal VIII: hearing grossly normal bilaterally  IX,X: midline uvula rise  XI: bilateral shoulder shrug equal and strong XII: midline tongue extension  Motor:  5/5 in upper and lower extremities bilaterally including strong and equal grip strength and dorsiflexion/plantar flexion Sensory: light touch normal in all extremities.  Cerebellar: normal finger-to-nose with bilateral upper extremities Gait: normal gait and balance CV: distal pulses palpable throughout   Skin: Skin is warm and dry. No rash noted. He is not diaphoretic.  Psychiatric: He has a normal mood and affect. His behavior is normal. Judgment and thought content normal.  Nursing note and vitals reviewed.    ED Treatments / Results  Labs (all labs ordered are listed, but only abnormal results are displayed) Labs Reviewed  PROTIME-INR - Abnormal; Notable for the following:       Result Value   Prothrombin Time 32.0 (*)    All other components within normal limits  I-STAT CHEM 8, ED - Abnormal; Notable for the following:    Chloride 100 (*)    BUN 27 (*)    Glucose, Bld 105 (*)    All other components within normal limits  CBC  BASIC METABOLIC PANEL    Radiology Ct Head Wo Contrast  Result Date: 05/22/2016 CLINICAL DATA:  Fall stairs.  Anticoagulated patient. EXAM: CT HEAD WITHOUT CONTRAST CT MAXILLOFACIAL WITHOUT CONTRAST CT CERVICAL SPINE WITHOUT CONTRAST TECHNIQUE: Multidetector CT imaging of the head, cervical spine, and maxillofacial structures were performed using the standard protocol without intravenous contrast. Multiplanar CT image reconstructions of the cervical spine and maxillofacial structures were also generated. COMPARISON:  None. FINDINGS: CT HEAD FINDINGS Brain: There is a small amount of subdural blood layering along the left tentorial leaflet. No CT evidence of acute cortical infarct. No intraparenchymal hemorrhage. No midline shift or mass effect. There is periventricular hypoattenuation compatible  with chronic microvascular disease. Vascular: Atherosclerotic calcification of the vertebral and internal carotid arteries at the skullbase. CT MAXILLOFACIAL FINDINGS Osseous: --Complex facial fracture types: No LeFort, zygomaticomaxillary complex or nasoorbitoethmoidal fracture. --Simple fracture types: None. --Mandible: No fracture or other acute abnormality. Orbits: The globes appear intact. Normal appearance of the intra- and extraconal fat. Symmetric extraocular  muscles. Sinuses: No fluid levels or advanced mucosal thickening. Soft tissues: Soft tissue swelling at the lower lip. CT CERVICAL SPINE FINDINGS Alignment: No static subluxation. Facets are aligned. Occipital condyles are normally positioned. Skull base and vertebrae: No acute fracture. Soft tissues and spinal canal: No prevertebral fluid or swelling. No visible canal hematoma. Disc levels: Multilevel degenerative disc disease and facet hypertrophy without advanced canal or neural foraminal stenosis. Upper chest: No pneumothorax, pulmonary nodule or pleural effusion. Other: Normal visualized paraspinal cervical soft tissues. IMPRESSION: 1. Small acute subdural hematoma along the left leaflet of the cerebellar tentorium, measuring 2 mm in thickness. No associated mass effect or hydrocephalus. 2. Lower facial soft tissue swelling without acute facial fracture. 3. No acute fracture or static subluxation of the cervical spine. These results were called by telephone at the time of interpretation on 05/22/2016 at 10:48 pm to [facility] , who verbally acknowledged these results. Electronically Signed   By: Ulyses Jarred M.D.   On: 05/22/2016 22:48   Ct Cervical Spine Wo Contrast  Result Date: 05/22/2016 CLINICAL DATA:  Fall stairs.  Anticoagulated patient. EXAM: CT HEAD WITHOUT CONTRAST CT MAXILLOFACIAL WITHOUT CONTRAST CT CERVICAL SPINE WITHOUT CONTRAST TECHNIQUE: Multidetector CT imaging of the head, cervical spine, and maxillofacial  structures were performed using the standard protocol without intravenous contrast. Multiplanar CT image reconstructions of the cervical spine and maxillofacial structures were also generated. COMPARISON:  None. FINDINGS: CT HEAD FINDINGS Brain: There is a small amount of subdural blood layering along the left tentorial leaflet. No CT evidence of acute cortical infarct. No intraparenchymal hemorrhage. No midline shift or mass effect. There is periventricular hypoattenuation compatible with chronic microvascular disease. Vascular: Atherosclerotic calcification of the vertebral and internal carotid arteries at the skullbase. CT MAXILLOFACIAL FINDINGS Osseous: --Complex facial fracture types: No LeFort, zygomaticomaxillary complex or nasoorbitoethmoidal fracture. --Simple fracture types: None. --Mandible: No fracture or other acute abnormality. Orbits: The globes appear intact. Normal appearance of the intra- and extraconal fat. Symmetric extraocular muscles. Sinuses: No fluid levels or advanced mucosal thickening. Soft tissues: Soft tissue swelling at the lower lip. CT CERVICAL SPINE FINDINGS Alignment: No static subluxation. Facets are aligned. Occipital condyles are normally positioned. Skull base and vertebrae: No acute fracture. Soft tissues and spinal canal: No prevertebral fluid or swelling. No visible canal hematoma. Disc levels: Multilevel degenerative disc disease and facet hypertrophy without advanced canal or neural foraminal stenosis. Upper chest: No pneumothorax, pulmonary nodule or pleural effusion. Other: Normal visualized paraspinal cervical soft tissues. IMPRESSION: 1. Small acute subdural hematoma along the left leaflet of the cerebellar tentorium, measuring 2 mm in thickness. No associated mass effect or hydrocephalus. 2. Lower facial soft tissue swelling without acute facial fracture. 3. No acute fracture or static subluxation of the cervical spine. These results were called by telephone at the  time of interpretation on 05/22/2016 at 10:48 pm to [facility] , who verbally acknowledged these results. Electronically Signed   By: Ulyses Jarred M.D.   On: 05/22/2016 22:48   Dg Knee Complete 4 Views Right  Result Date: 05/22/2016 CLINICAL DATA:  Patient fell with right knee pain anteriorly, swelling and laceration. EXAM: RIGHT KNEE - COMPLETE 4+ VIEW COMPARISON:  None. FINDINGS: There is femorotibial joint space narrowing along the lateral aspect with spurring. Spurring is also noted of the tibial spine. No acute fracture or bone destruction. No joint effusion. Popliteal arteriosclerosis of incidental note. No intra-articular loose bodies. IMPRESSION: Lateral femorotibial joint space narrowing. No acute osseous  abnormality. Electronically Signed   By: Ashley Royalty M.D.   On: 05/22/2016 22:45   Ct Maxillofacial Wo Contrast  Result Date: 05/22/2016 CLINICAL DATA:  Fall stairs.  Anticoagulated patient. EXAM: CT HEAD WITHOUT CONTRAST CT MAXILLOFACIAL WITHOUT CONTRAST CT CERVICAL SPINE WITHOUT CONTRAST TECHNIQUE: Multidetector CT imaging of the head, cervical spine, and maxillofacial structures were performed using the standard protocol without intravenous contrast. Multiplanar CT image reconstructions of the cervical spine and maxillofacial structures were also generated. COMPARISON:  None. FINDINGS: CT HEAD FINDINGS Brain: There is a small amount of subdural blood layering along the left tentorial leaflet. No CT evidence of acute cortical infarct. No intraparenchymal hemorrhage. No midline shift or mass effect. There is periventricular hypoattenuation compatible with chronic microvascular disease. Vascular: Atherosclerotic calcification of the vertebral and internal carotid arteries at the skullbase. CT MAXILLOFACIAL FINDINGS Osseous: --Complex facial fracture types: No LeFort, zygomaticomaxillary complex or nasoorbitoethmoidal fracture. --Simple fracture types: None. --Mandible: No fracture  or other acute abnormality. Orbits: The globes appear intact. Normal appearance of the intra- and extraconal fat. Symmetric extraocular muscles. Sinuses: No fluid levels or advanced mucosal thickening. Soft tissues: Soft tissue swelling at the lower lip. CT CERVICAL SPINE FINDINGS Alignment: No static subluxation. Facets are aligned. Occipital condyles are normally positioned. Skull base and vertebrae: No acute fracture. Soft tissues and spinal canal: No prevertebral fluid or swelling. No visible canal hematoma. Disc levels: Multilevel degenerative disc disease and facet hypertrophy without advanced canal or neural foraminal stenosis. Upper chest: No pneumothorax, pulmonary nodule or pleural effusion. Other: Normal visualized paraspinal cervical soft tissues. IMPRESSION: 1. Small acute subdural hematoma along the left leaflet of the cerebellar tentorium, measuring 2 mm in thickness. No associated mass effect or hydrocephalus. 2. Lower facial soft tissue swelling without acute facial fracture. 3. No acute fracture or static subluxation of the cervical spine. These results were called by telephone at the time of interpretation on 05/22/2016 at 10:48 pm to [facility] [place] , who verbally acknowledged these results. Electronically Signed   By: Ulyses Jarred M.D.   On: 05/22/2016 22:48    Procedures .Marland KitchenLaceration Repair Date/Time: 05/23/2016 12:35 AM Performed by: Abigail Butts Authorized by: Abigail Butts   Consent:    Consent obtained:  Verbal   Consent given by:  Patient   Risks discussed:  Infection, pain and poor cosmetic result   Alternatives discussed:  No treatment Anesthesia (see MAR for exact dosages):    Anesthesia method:  Nerve block   Block anesthetic:  Lidocaine 1% w/o epi   Block technique:  Mental nerve block   Block injection procedure:  Anatomic landmarks identified, anatomic landmarks palpated, introduced needle, negative aspiration for blood and incremental  injection   Block outcome:  Anesthesia achieved Laceration details:    Location:  Lip   Lip location:  Lower exterior lip   Length (cm):  3 Repair type:    Repair type:  Simple Pre-procedure details:    Preparation:  Patient was prepped and draped in usual sterile fashion Exploration:    Hemostasis achieved with:  Direct pressure   Wound exploration: entire depth of wound probed and visualized     Contaminated: no   Treatment:    Area cleansed with:  Saline   Amount of cleaning:  Standard   Irrigation solution:  Sterile water   Irrigation volume:  500   Irrigation method:  Syringe Skin repair:    Repair method:  Sutures   Suture size:  4-0   Suture material:  Fast-absorbing gut   Suture technique:  Simple interrupted   Number of sutures:  5 Approximation:    Approximation:  Close   Vermilion border: well-aligned   Post-procedure details:    Dressing:  Open (no dressing)   Patient tolerance of procedure:  Tolerated well, no immediate complications   (including critical care time)  CRITICAL CARE Performed by: Abigail Butts Total critical care time: 45 minutes Critical care time was exclusive of separately billable procedures and treating other patients. Critical care was necessary to treat or prevent imminent or life-threatening deterioration. Critical care was time spent personally by me on the following activities: development of treatment plan with patient and/or surrogate as well as nursing, discussions with consultants, evaluation of patient's response to treatment, examination of patient, obtaining history from patient or surrogate, ordering and performing treatments and interventions, ordering and review of laboratory studies, ordering and review of radiographic studies, pulse oximetry and re-evaluation of patient's condition.   Medications Ordered in ED Medications  prothrombin complex conc human (KCENTRA) IVPB 2,500 Units (not administered)  phytonadione  (VITAMIN K) 10 mg in dextrose 5 % 50 mL IVPB (not administered)  Tdap (BOOSTRIX) injection 0.5 mL (0.5 mLs Intramuscular Given 05/22/16 2238)  lidocaine (PF) (XYLOCAINE) 1 % injection 30 mL (30 mLs Infiltration Given 05/22/16 2238)     Initial Impression / Assessment and Plan / ED Course  I have reviewed the triage vital signs and the nursing notes.  Pertinent labs & imaging results that were available during my care of the patient were reviewed by me and considered in my medical decision making (see chart for details).  Clinical Course as of May 22 2334  Mon May 22, 2016  2304 Slightly supratherapeutic INR: 3.02 [HM]  2304 Small acute subdural hematoma along the left leaflet of the cerebellar tentorium, measuring 2 mm in thickness. No associated mass effect. CT Head Wo Contrast [HM]  2326 Discussed with Dr. Sherwood Gambler who requests reversal of anticoagulant and admission to ICU.    [HM]  2333 Discussed with Dr. Mortimer Fries who will evaluate for admission.  [HM]    Clinical Course User Index [HM] Jarrett Soho Kalen Neidert, PA-C   Pt with mechanical fall and subsequent subdural hematoma.  Pt to be admitted to PCCM; Dr. Sherwood Gambler to evaluate.  Reversal of anticoagulation complete. Wound repaired. Patient remains neurologically intact.  The patient was discussed with and seen by Dr. Roxanne Mins who agrees with the treatment plan.   Final Clinical Impressions(s) / ED Diagnoses   Final diagnoses:  Fall, initial encounter  Lip laceration, initial encounter  Subdural hematoma ([place])  Anticoagulated on Coumadin  Supratherapeutic INR    New Prescriptions New Prescriptions   No medications on file     Abigail Butts, PA-C [id number]    Delora Fuel, MD [phone]

## 2016-05-22 NOTE — ED Notes (Signed)
Patient transported to CT 

## 2016-05-22 NOTE — Progress Notes (Signed)
eLink Physician-Brief Progress Note Patient Name: BLAKELEE FULCO DOB: 03/24/1928 MRN: [id number]   Date of Service  05/22/2016  HPI/Events of Note  82 yo s/p on coumadin with subdural bleed inr 3, VS stable, resp status stable  eICU Interventions  Neuro recs:ICU admission and reversal of coagulapthy     Intervention Category Evaluation Type: New Patient Evaluation  Flora Lipps 05/22/2016, 11:40 PM

## 2016-05-23 ENCOUNTER — Inpatient Hospital Stay (HOSPITAL_COMMUNITY): Payer: Medicare Other

## 2016-05-23 DIAGNOSIS — I6200 Nontraumatic subdural hemorrhage, unspecified: Secondary | ICD-10-CM

## 2016-05-23 DIAGNOSIS — R791 Abnormal coagulation profile: Secondary | ICD-10-CM | POA: Diagnosis not present

## 2016-05-23 DIAGNOSIS — Z23 Encounter for immunization: Secondary | ICD-10-CM | POA: Diagnosis not present

## 2016-05-23 DIAGNOSIS — S01511A Laceration without foreign body of lip, initial encounter: Secondary | ICD-10-CM | POA: Insufficient documentation

## 2016-05-23 DIAGNOSIS — W19XXXA Unspecified fall, initial encounter: Secondary | ICD-10-CM | POA: Diagnosis not present

## 2016-05-23 DIAGNOSIS — Z955 Presence of coronary angioplasty implant and graft: Secondary | ICD-10-CM | POA: Diagnosis not present

## 2016-05-23 DIAGNOSIS — D6832 Hemorrhagic disorder due to extrinsic circulating anticoagulants: Secondary | ICD-10-CM | POA: Diagnosis present

## 2016-05-23 DIAGNOSIS — E1122 Type 2 diabetes mellitus with diabetic chronic kidney disease: Secondary | ICD-10-CM | POA: Diagnosis present

## 2016-05-23 DIAGNOSIS — W102XXA Fall (on)(from) incline, initial encounter: Secondary | ICD-10-CM | POA: Diagnosis not present

## 2016-05-23 DIAGNOSIS — I252 Old myocardial infarction: Secondary | ICD-10-CM | POA: Diagnosis not present

## 2016-05-23 DIAGNOSIS — T45515A Adverse effect of anticoagulants, initial encounter: Secondary | ICD-10-CM | POA: Diagnosis present

## 2016-05-23 DIAGNOSIS — J9690 Respiratory failure, unspecified, unspecified whether with hypoxia or hypercapnia: Secondary | ICD-10-CM | POA: Diagnosis not present

## 2016-05-23 DIAGNOSIS — S065X0A Traumatic subdural hemorrhage without loss of consciousness, initial encounter: Secondary | ICD-10-CM | POA: Diagnosis not present

## 2016-05-23 DIAGNOSIS — S065X9A Traumatic subdural hemorrhage with loss of consciousness of unspecified duration, initial encounter: Secondary | ICD-10-CM

## 2016-05-23 DIAGNOSIS — E1165 Type 2 diabetes mellitus with hyperglycemia: Secondary | ICD-10-CM | POA: Diagnosis present

## 2016-05-23 DIAGNOSIS — Z86718 Personal history of other venous thrombosis and embolism: Secondary | ICD-10-CM

## 2016-05-23 DIAGNOSIS — G4733 Obstructive sleep apnea (adult) (pediatric): Secondary | ICD-10-CM | POA: Diagnosis present

## 2016-05-23 DIAGNOSIS — I2510 Atherosclerotic heart disease of native coronary artery without angina pectoris: Secondary | ICD-10-CM | POA: Diagnosis present

## 2016-05-23 DIAGNOSIS — S0990XA Unspecified injury of head, initial encounter: Secondary | ICD-10-CM | POA: Diagnosis not present

## 2016-05-23 DIAGNOSIS — I129 Hypertensive chronic kidney disease with stage 1 through stage 4 chronic kidney disease, or unspecified chronic kidney disease: Secondary | ICD-10-CM | POA: Diagnosis present

## 2016-05-23 DIAGNOSIS — Z7901 Long term (current) use of anticoagulants: Secondary | ICD-10-CM

## 2016-05-23 DIAGNOSIS — S065XAA Traumatic subdural hemorrhage with loss of consciousness status unknown, initial encounter: Secondary | ICD-10-CM | POA: Diagnosis present

## 2016-05-23 DIAGNOSIS — Z7982 Long term (current) use of aspirin: Secondary | ICD-10-CM | POA: Diagnosis not present

## 2016-05-23 DIAGNOSIS — Z7984 Long term (current) use of oral hypoglycemic drugs: Secondary | ICD-10-CM | POA: Diagnosis not present

## 2016-05-23 DIAGNOSIS — Z79899 Other long term (current) drug therapy: Secondary | ICD-10-CM | POA: Diagnosis not present

## 2016-05-23 DIAGNOSIS — R739 Hyperglycemia, unspecified: Secondary | ICD-10-CM

## 2016-05-23 DIAGNOSIS — F419 Anxiety disorder, unspecified: Secondary | ICD-10-CM | POA: Diagnosis present

## 2016-05-23 DIAGNOSIS — N183 Chronic kidney disease, stage 3 (moderate): Secondary | ICD-10-CM | POA: Diagnosis present

## 2016-05-23 DIAGNOSIS — S80211A Abrasion, right knee, initial encounter: Secondary | ICD-10-CM | POA: Diagnosis present

## 2016-05-23 DIAGNOSIS — Z87891 Personal history of nicotine dependence: Secondary | ICD-10-CM | POA: Diagnosis not present

## 2016-05-23 DIAGNOSIS — E785 Hyperlipidemia, unspecified: Secondary | ICD-10-CM | POA: Diagnosis present

## 2016-05-23 DIAGNOSIS — S025XXA Fracture of tooth (traumatic), initial encounter for closed fracture: Secondary | ICD-10-CM | POA: Diagnosis present

## 2016-05-23 LAB — GLUCOSE, CAPILLARY
GLUCOSE-CAPILLARY: 83 mg/dL (ref 65–99)
Glucose-Capillary: 120 mg/dL — ABNORMAL HIGH (ref 65–99)
Glucose-Capillary: 129 mg/dL — ABNORMAL HIGH (ref 65–99)
Glucose-Capillary: 129 mg/dL — ABNORMAL HIGH (ref 65–99)
Glucose-Capillary: 145 mg/dL — ABNORMAL HIGH (ref 65–99)
Glucose-Capillary: 89 mg/dL (ref 65–99)

## 2016-05-23 LAB — CBC
HCT: 37.5 % — ABNORMAL LOW (ref 39.0–52.0)
HCT: 38.1 % — ABNORMAL LOW (ref 39.0–52.0)
HEMOGLOBIN: 12.5 g/dL — AB (ref 13.0–17.0)
Hemoglobin: 12.8 g/dL — ABNORMAL LOW (ref 13.0–17.0)
MCH: 30.9 pg (ref 26.0–34.0)
MCH: 31.1 pg (ref 26.0–34.0)
MCHC: 33.3 g/dL (ref 30.0–36.0)
MCHC: 33.6 g/dL (ref 30.0–36.0)
MCV: 92.7 fL (ref 78.0–100.0)
MCV: 92.8 fL (ref 78.0–100.0)
PLATELETS: 236 10*3/uL (ref 150–400)
PLATELETS: 247 10*3/uL (ref 150–400)
RBC: 4.04 MIL/uL — AB (ref 4.22–5.81)
RBC: 4.11 MIL/uL — ABNORMAL LOW (ref 4.22–5.81)
RDW: 15.1 % (ref 11.5–15.5)
RDW: 15.1 % (ref 11.5–15.5)
WBC: 10.3 10*3/uL (ref 4.0–10.5)
WBC: 8.8 10*3/uL (ref 4.0–10.5)

## 2016-05-23 LAB — BASIC METABOLIC PANEL
ANION GAP: 8 (ref 5–15)
ANION GAP: 9 (ref 5–15)
BUN: 22 mg/dL — AB (ref 6–20)
BUN: 23 mg/dL — AB (ref 6–20)
CALCIUM: 10.7 mg/dL — AB (ref 8.9–10.3)
CHLORIDE: 103 mmol/L (ref 101–111)
CO2: 26 mmol/L (ref 22–32)
CO2: 26 mmol/L (ref 22–32)
Calcium: 10.7 mg/dL — ABNORMAL HIGH (ref 8.9–10.3)
Chloride: 104 mmol/L (ref 101–111)
Creatinine, Ser: 1.21 mg/dL (ref 0.61–1.24)
Creatinine, Ser: 1.24 mg/dL (ref 0.61–1.24)
GFR calc Af Amer: 59 mL/min — ABNORMAL LOW (ref >60)
GFR calc Af Amer: >60 mL/min (ref >60)
GFR, EST NON AFRICAN AMERICAN: 51 mL/min — AB (ref >60)
GFR, EST NON AFRICAN AMERICAN: 52 mL/min — AB (ref >60)
GLUCOSE: 96 mg/dL (ref 65–99)
GLUCOSE: 99 mg/dL (ref 65–99)
POTASSIUM: 4.2 mmol/L (ref 3.5–5.1)
Potassium: 4 mmol/L (ref 3.5–5.1)
Sodium: 138 mmol/L (ref 135–145)
Sodium: 138 mmol/L (ref 135–145)

## 2016-05-23 LAB — PROTIME-INR
INR: 1.16
INR: 1.14
INR: 1.17
PROTHROMBIN TIME: 14.9 s (ref 11.4–15.2)
Prothrombin Time: 14.7 seconds (ref 11.4–15.2)
Prothrombin Time: 15 seconds (ref 11.4–15.2)

## 2016-05-23 LAB — HEMOGLOBIN A1C
HEMOGLOBIN A1C: 5.9 % — AB (ref 4.8–5.6)
MEAN PLASMA GLUCOSE: 123 mg/dL

## 2016-05-23 LAB — MAGNESIUM: MAGNESIUM: 1.6 mg/dL — AB (ref 1.7–2.4)

## 2016-05-23 LAB — MRSA PCR SCREENING: MRSA BY PCR: NEGATIVE

## 2016-05-23 LAB — PHOSPHORUS: Phosphorus: 2.3 mg/dL — ABNORMAL LOW (ref 2.5–4.6)

## 2016-05-23 MED ORDER — SODIUM CHLORIDE 0.9 % IV SOLN
INTRAVENOUS | Status: DC
  Administered 2016-05-23 – 2016-05-24 (×3): via INTRAVENOUS

## 2016-05-23 MED ORDER — MAGNESIUM SULFATE 2 GM/50ML IV SOLN
2.0000 g | Freq: Once | INTRAVENOUS | Status: AC
  Administered 2016-05-23: 2 g via INTRAVENOUS
  Filled 2016-05-23: qty 50

## 2016-05-23 MED ORDER — SODIUM CHLORIDE 0.9 % IV SOLN: 250.0000 mL | INTRAVENOUS | Status: DC | PRN

## 2016-05-23 MED ORDER — INSULIN ASPART 100 UNIT/ML ~~LOC~~ SOLN
0.0000 [IU] | SUBCUTANEOUS | Status: DC
  Administered 2016-05-23 – 2016-05-24 (×4): 2 [IU] via SUBCUTANEOUS
  Administered 2016-05-24: 3 [IU] via SUBCUTANEOUS
  Administered 2016-05-24: 2 [IU] via SUBCUTANEOUS

## 2016-05-23 MED ORDER — HYDRALAZINE HCL 20 MG/ML IJ SOLN: 10.0000 mg | INTRAMUSCULAR | Status: DC | PRN

## 2016-05-23 NOTE — ED Notes (Signed)
Vit. K not stopped. Medication fully infused

## 2016-05-23 NOTE — Progress Notes (Signed)
RT NOTE:  Pt refuses CPAP tonight. He does wear @ home, however, does not wish to wear here.

## 2016-05-23 NOTE — Progress Notes (Signed)
Subjective: Patient resting in bed comfortably, denies headache. Received full reversal of anticoagulants. INR now 1.17.  Objective: Vital signs in last 24 hours: Vitals:   05/23/16 0530 05/23/16 0600 05/23/16 0700 05/23/16 0800  BP: 119/62 120/78 137/62 (!) 146/74  Pulse: 63 65 62 62  Resp: 18 18 18 17   Temp:      TempSrc:      SpO2: 96% 97% 94% 96%  Weight:        Intake/Output from previous day: 12/25 0701 - 12/26 0700 In: 392.5 [I.V.:392.5] Out: 300 [Urine:300] Intake/Output this shift: Total I/O In: 150 [I.V.:150] Out: -   Physical Exam:  Awake alert, oriented to name, [facility] hospital, and December 2017. Following commands. Speech fluent. Moving all 4 extremities well.  CBC  Recent Labs  05/23/16 0007 05/23/16 0437  WBC 10.3 8.8  HGB 12.8* 12.5*  HCT 38.1* 37.5*  PLT 247 236   BMET  Recent Labs  05/23/16 0007 05/23/16 0437  NA 138 138  K 4.0 4.2  CL 104 103  CO2 26 26  GLUCOSE 96 99  BUN 23* 22*  CREATININE 1.24 1.21  CALCIUM 10.7* 10.7*   ABG    Component Value Date/Time   TCO2 27 05/22/2016 2203    Studies/Results: Ct Head Wo Contrast  Result Date: 05/22/2016 CLINICAL DATA:  Fall stairs.  Anticoagulated patient. EXAM: CT HEAD WITHOUT CONTRAST CT MAXILLOFACIAL WITHOUT CONTRAST CT CERVICAL SPINE WITHOUT CONTRAST TECHNIQUE: Multidetector CT imaging of the head, cervical spine, and maxillofacial structures were performed using the standard protocol without intravenous contrast. Multiplanar CT image reconstructions of the cervical spine and maxillofacial structures were also generated. COMPARISON:  None. FINDINGS: CT HEAD FINDINGS Brain: There is a small amount of subdural blood layering along the left tentorial leaflet. No CT evidence of acute cortical infarct. No intraparenchymal hemorrhage. No midline shift or mass effect. There is periventricular hypoattenuation compatible with chronic microvascular disease. Vascular: Atherosclerotic  calcification of the vertebral and internal carotid arteries at the skullbase. CT MAXILLOFACIAL FINDINGS Osseous: --Complex facial fracture types: No LeFort, zygomaticomaxillary complex or nasoorbitoethmoidal fracture. --Simple fracture types: None. --Mandible: No fracture or other acute abnormality. Orbits: The globes appear intact. Normal appearance of the intra- and extraconal fat. Symmetric extraocular muscles. Sinuses: No fluid levels or advanced mucosal thickening. Soft tissues: Soft tissue swelling at the lower lip. CT CERVICAL SPINE FINDINGS Alignment: No static subluxation. Facets are aligned. Occipital condyles are normally positioned. Skull base and vertebrae: No acute fracture. Soft tissues and spinal canal: No prevertebral fluid or swelling. No visible canal hematoma. Disc levels: Multilevel degenerative disc disease and facet hypertrophy without advanced canal or neural foraminal stenosis. Upper chest: No pneumothorax, pulmonary nodule or pleural effusion. Other: Normal visualized paraspinal cervical soft tissues. IMPRESSION: 1. Small acute subdural hematoma along the left leaflet of the cerebellar tentorium, measuring 2 mm in thickness. No associated mass effect or hydrocephalus. 2. Lower facial soft tissue swelling without acute facial fracture. 3. No acute fracture or static subluxation of the cervical spine. These results were called by telephone at the time of interpretation on 05/22/2016 at 10:48 pm to [facility] [place] , who verbally acknowledged these results. Electronically Signed   By: Ulyses Jarred M.D.   On: 05/22/2016 22:48   Ct Cervical Spine Wo Contrast  Result Date: 05/22/2016 CLINICAL DATA:  Fall stairs.  Anticoagulated patient. EXAM: CT HEAD WITHOUT CONTRAST CT MAXILLOFACIAL WITHOUT CONTRAST CT CERVICAL SPINE WITHOUT CONTRAST TECHNIQUE: Multidetector CT imaging of the head, cervical  spine, and maxillofacial structures were performed using the standard protocol without  intravenous contrast. Multiplanar CT image reconstructions of the cervical spine and maxillofacial structures were also generated. COMPARISON:  None. FINDINGS: CT HEAD FINDINGS Brain: There is a small amount of subdural blood layering along the left tentorial leaflet. No CT evidence of acute cortical infarct. No intraparenchymal hemorrhage. No midline shift or mass effect. There is periventricular hypoattenuation compatible with chronic microvascular disease. Vascular: Atherosclerotic calcification of the vertebral and internal carotid arteries at the skullbase. CT MAXILLOFACIAL FINDINGS Osseous: --Complex facial fracture types: No LeFort, zygomaticomaxillary complex or nasoorbitoethmoidal fracture. --Simple fracture types: None. --Mandible: No fracture or other acute abnormality. Orbits: The globes appear intact. Normal appearance of the intra- and extraconal fat. Symmetric extraocular muscles. Sinuses: No fluid levels or advanced mucosal thickening. Soft tissues: Soft tissue swelling at the lower lip. CT CERVICAL SPINE FINDINGS Alignment: No static subluxation. Facets are aligned. Occipital condyles are normally positioned. Skull base and vertebrae: No acute fracture. Soft tissues and spinal canal: No prevertebral fluid or swelling. No visible canal hematoma. Disc levels: Multilevel degenerative disc disease and facet hypertrophy without advanced canal or neural foraminal stenosis. Upper chest: No pneumothorax, pulmonary nodule or pleural effusion. Other: Normal visualized paraspinal cervical soft tissues. IMPRESSION: 1. Small acute subdural hematoma along the left leaflet of the cerebellar tentorium, measuring 2 mm in thickness. No associated mass effect or hydrocephalus. 2. Lower facial soft tissue swelling without acute facial fracture. 3. No acute fracture or static subluxation of the cervical spine. These results were called by telephone at the time of interpretation on 05/22/2016 at 10:48 pm to [facility] [place] , who verbally acknowledged these results. Electronically Signed   By: Ulyses Jarred M.D.   On: 05/22/2016 22:48   Dg Knee Complete 4 Views Right  Result Date: 05/22/2016 CLINICAL DATA:  Patient fell with right knee pain anteriorly, swelling and laceration. EXAM: RIGHT KNEE - COMPLETE 4+ VIEW COMPARISON:  None. FINDINGS: There is femorotibial joint space narrowing along the lateral aspect with spurring. Spurring is also noted of the tibial spine. No acute fracture or bone destruction. No joint effusion. Popliteal arteriosclerosis of incidental note. No intra-articular loose bodies. IMPRESSION: Lateral femorotibial joint space narrowing. No acute osseous abnormality. Electronically Signed   By: Ashley Royalty M.D.   On: 05/22/2016 22:45   Ct Maxillofacial Wo Contrast  Result Date: 05/22/2016 CLINICAL DATA:  Fall stairs.  Anticoagulated patient. EXAM: CT HEAD WITHOUT CONTRAST CT MAXILLOFACIAL WITHOUT CONTRAST CT CERVICAL SPINE WITHOUT CONTRAST TECHNIQUE: Multidetector CT imaging of the head, cervical spine, and maxillofacial structures were performed using the standard protocol without intravenous contrast. Multiplanar CT image reconstructions of the cervical spine and maxillofacial structures were also generated. COMPARISON:  None. FINDINGS: CT HEAD FINDINGS Brain: There is a small amount of subdural blood layering along the left tentorial leaflet. No CT evidence of acute cortical infarct. No intraparenchymal hemorrhage. No midline shift or mass effect. There is periventricular hypoattenuation compatible with chronic microvascular disease. Vascular: Atherosclerotic calcification of the vertebral and internal carotid arteries at the skullbase. CT MAXILLOFACIAL FINDINGS Osseous: --Complex facial fracture types: No LeFort, zygomaticomaxillary complex or nasoorbitoethmoidal fracture. --Simple fracture types: None. --Mandible: No fracture or other acute abnormality. Orbits: The globes appear  intact. Normal appearance of the intra- and extraconal fat. Symmetric extraocular muscles. Sinuses: No fluid levels or advanced mucosal thickening. Soft tissues: Soft tissue swelling at the lower lip. CT CERVICAL SPINE FINDINGS Alignment: No static subluxation. Facets are aligned. Occipital  condyles are normally positioned. Skull base and vertebrae: No acute fracture. Soft tissues and spinal canal: No prevertebral fluid or swelling. No visible canal hematoma. Disc levels: Multilevel degenerative disc disease and facet hypertrophy without advanced canal or neural foraminal stenosis. Upper chest: No pneumothorax, pulmonary nodule or pleural effusion. Other: Normal visualized paraspinal cervical soft tissues. IMPRESSION: 1. Small acute subdural hematoma along the left leaflet of the cerebellar tentorium, measuring 2 mm in thickness. No associated mass effect or hydrocephalus. 2. Lower facial soft tissue swelling without acute facial fracture. 3. No acute fracture or static subluxation of the cervical spine. These results were called by telephone at the time of interpretation on 05/22/2016 at 10:48 pm to [facility] [place] , who verbally acknowledged these results. Electronically Signed   By: Ulyses Jarred M.D.   On: 05/22/2016 22:48    Assessment/Plan: Remains neurologically stable. Case discussed with Dr. Nelda Marseille, and patient is stable to transfer to stepdown unit today. He is scheduled for follow-up CT the brain without contrast tomorrow morning. Activity, diet, etc. can be advanced as tolerated.   Hosie Spangle, MD 05/23/2016, 9:00 AM

## 2016-05-23 NOTE — Progress Notes (Signed)
   05/23/16 1500  Clinical Encounter Type  Visited With Patient not available  Visit Type Initial  Referral From Chaplain   Will f/u another time.   Gerrit Heck, Chaplain

## 2016-05-23 NOTE — Progress Notes (Signed)
*  PRELIMINARY RESULTS* Vascular Ultrasound Bilateral lower extremity venous duplex has been completed.  Preliminary findings: No evidence of deep vein thrombosis in the visualized veins of the lower extremities.  Right sided bakers cyst noted.   Alejandro Russell 05/23/2016, 2:15 PM

## 2016-05-23 NOTE — Consult Note (Signed)
Reason for Consult:  Subdural hematoma, therapeutic anticoagulation with Coumadin Referring Physician:  Dr. Delora Fuel  Alejandro Russell is an 82 y.o. right-handed white male.  HPI: Patient presents to the emergency room after having fallen this afternoon in [place]. He landed face forward, and did suffer a laceration of his lower lip and some fractures of teeth. Both he and his wife deny any loss of consciousness. His wife drove them back from [place] and he presented to the [facility] emergency room for evaluation. He was noted by the staff to walk into the emergency room and to be neurologically intact. CT of the brain without contrast was obtained revealed a thin left tentorial subdural hematoma. Notably he has been anticoagulated with Coumadin (INR 3.02) because of a history of DVT, last active over 15 years ago. I recommended reversal of the anticoagulation and admission to the critical care medicine service, with neurosurgery following as a consultant. He does have a remote history of atherosclerotic cardiovascular disease, having undergone angioplasty 30 or more years ago. He denies any angina or other cardiac complaints at this time. He does not have a cardiologist at this time, his primary physician is Dr. Carmie Kanner at [facility].  Patient denies any headaches, double vision, blurred vision, nausea, vomiting, weakness, seizures or other neurologic complaints  Past Medical History:  Past Medical History:  Diagnosis Date  . Anemia   . Anxiety   . CAD (coronary artery disease)   . Chronic renal disease, stage III   . Depression   . Diabetes mellitus   . Diverticulosis   . Diverticulosis   . DVT (deep venous thrombosis) (HCC)    on coumadin  . Hyperlipidemia   . Hypertension   . Myocardial infarct   . Nonspecific abnormal finding in stool contents 09/09/2013  . Obesity   . OSA (obstructive sleep apnea)    wears CPAP  .  Peripheral neuropathy ([place])   . Psoriasis   . Tubulovillous adenoma of colon 1995  . Type 2 diabetes mellitus ([place])   . Vitamin B12 deficiency     Past Surgical History:  Past Surgical History:  Procedure Laterality Date  . CARDIAC CATHETERIZATION     many yrs ago with stent placed  . CHOLECYSTECTOMY  may 16,2011  . COLONOSCOPY WITH PROPOFOL N/A 09/09/2013   Procedure: COLONOSCOPY WITH PROPOFOL;  Surgeon: Ladene Artist, MD;  Location: [facility] ENDOSCOPY;  Service: Endoscopy;  Laterality: N/A;  . CORONARY STENT PLACEMENT    . ESOPHAGOGASTRODUODENOSCOPY (EGD) WITH PROPOFOL N/A 09/09/2013   Procedure: ESOPHAGOGASTRODUODENOSCOPY (EGD) WITH PROPOFOL;  Surgeon: Ladene Artist, MD;  Location: [facility] ENDOS[facility];  Service: Endoscopy;  Laterality: N/A;  . INGUINAL HERNIA REPAIR      Family History:  Family History  Problem Relation Age of Onset  . Emphysema Mother   . Dementia Father   . Colon cancer Neg Hx   . Esophageal cancer Neg Hx   . Stomach cancer Neg Hx     Social History:  reports that he quit smoking about 38 years ago. His smoking use included Cigarettes. He has a 62.00 pack-year smoking history. He has never used smokeless tobacco. He reports that he does not drink alcohol or use drugs.  Allergies: No Known Allergies  Medications: I have reviewed the patient's current medications.  ROS:  Notable for those difficulties described in his history of present illness and past medical history, but is otherwise unremarkable.  Physical Examination:  Patient well-developed well-nourished white male in no acute distress. Blood pressure 121/56, pulse 64, temperature 97.5 F (36.4 C), temperature source Oral, resp. rate 18, SpO2 96 %. Lungs:  Clear to auscultation, symmetrical respiratory excursion. Heart:  Regular rate and rhythm, no murmur. Abdomen:  Soft, nondistended, bowel sounds present. Extremity:  No clubbing, cyanosis, or edema.  Neurological Examination: Mental Status  Examination:  Awake, alert, oriented to name, [facility] hospital, and December 2017. Following commands. Speech fluent. Cranial Nerve Examination:  Pupils 3 mm, equal, round, reactive to light. EOMI. Facial sensation intact. Facial movements symmetrical. Hearing present by palatal movements symmetrical shoulder shrug symmetrical tongue midline. Motor Examination:  5/5 strength in upper and lower extremities. No drift of upper extremities. Sensory Examination:  Intact to pinprick throughout. Reflex Examination:   Symmetrical Gait and Stance Examination:  Not tested due to the nature the patient's condition.   Results for orders placed or performed during the hospital encounter of 05/22/16 (from the past 48 hour(s))  Protime-INR     Status: Abnormal   Collection Time: 05/22/16  9:53 PM  Result Value Ref Range   Prothrombin Time 32.0 (H) 11.4 - 15.2 seconds   INR 3.02   I-stat chem 8, ed     Status: Abnormal   Collection Time: 05/22/16 10:03 PM  Result Value Ref Range   Sodium 140 135 - 145 mmol/L   Potassium 4.3 3.5 - 5.1 mmol/L   Chloride 100 (L) 101 - 111 mmol/L   BUN 27 (H) 6 - 20 mg/dL   Creatinine, Ser 1.20 0.61 - 1.24 mg/dL   Glucose, Bld 105 (H) 65 - 99 mg/dL   Calcium, Ion 1.40 1.15 - 1.40 mmol/L   TCO2 27 0 - 100 mmol/L   Hemoglobin 13.3 13.0 - 17.0 g/dL   HCT 39.0 39.0 - 52.0 %    Ct Head Wo Contrast  Result Date: 05/22/2016 CLINICAL DATA:  Fall stairs.  Anticoagulated patient. EXAM: CT HEAD WITHOUT CONTRAST CT MAXILLOFACIAL WITHOUT CONTRAST CT CERVICAL SPINE WITHOUT CONTRAST TECHNIQUE: Multidetector CT imaging of the head, cervical spine, and maxillofacial structures were performed using the standard protocol without intravenous contrast. Multiplanar CT image reconstructions of the cervical spine and maxillofacial structures were also generated. COMPARISON:  None. FINDINGS: CT HEAD FINDINGS Brain: There is a small amount of subdural blood layering along the left tentorial  leaflet. No CT evidence of acute cortical infarct. No intraparenchymal hemorrhage. No midline shift or mass effect. There is periventricular hypoattenuation compatible with chronic microvascular disease. Vascular: Atherosclerotic calcification of the vertebral and internal carotid arteries at the skullbase. CT MAXILLOFACIAL FINDINGS Osseous: --Complex facial fracture types: No LeFort, zygomaticomaxillary complex or nasoorbitoethmoidal fracture. --Simple fracture types: None. --Mandible: No fracture or other acute abnormality. Orbits: The globes appear intact. Normal appearance of the intra- and extraconal fat. Symmetric extraocular muscles. Sinuses: No fluid levels or advanced mucosal thickening. Soft tissues: Soft tissue swelling at the lower lip. CT CERVICAL SPINE FINDINGS Alignment: No static subluxation. Facets are aligned. Occipital condyles are normally positioned. Skull base and vertebrae: No acute fracture. Soft tissues and spinal canal: No prevertebral fluid or swelling. No visible canal hematoma. Disc levels: Multilevel degenerative disc disease and facet hypertrophy without advanced canal or neural foraminal stenosis. Upper chest: No pneumothorax, pulmonary nodule or pleural effusion. Other: Normal visualized paraspinal cervical soft tissues. IMPRESSION: 1. Small acute subdural hematoma along the left leaflet of the cerebellar tentorium, measuring 2 mm in thickness. No associated mass effect or hydrocephalus. 2. Lower  facial soft tissue swelling without acute facial fracture. 3. No acute fracture or static subluxation of the cervical spine. These results were called by telephone at the time of interpretation on 05/22/2016 at 10:48 pm to [facility] , who verbally acknowledged these results. Electronically Signed   By: Ulyses Jarred M.D.   On: 05/22/2016 22:48   Ct Cervical Spine Wo Contrast  Result Date: 05/22/2016 CLINICAL DATA:  Fall stairs.  Anticoagulated patient. EXAM: CT HEAD  WITHOUT CONTRAST CT MAXILLOFACIAL WITHOUT CONTRAST CT CERVICAL SPINE WITHOUT CONTRAST TECHNIQUE: Multidetector CT imaging of the head, cervical spine, and maxillofacial structures were performed using the standard protocol without intravenous contrast. Multiplanar CT image reconstructions of the cervical spine and maxillofacial structures were also generated. COMPARISON:  None. FINDINGS: CT HEAD FINDINGS Brain: There is a small amount of subdural blood layering along the left tentorial leaflet. No CT evidence of acute cortical infarct. No intraparenchymal hemorrhage. No midline shift or mass effect. There is periventricular hypoattenuation compatible with chronic microvascular disease. Vascular: Atherosclerotic calcification of the vertebral and internal carotid arteries at the skullbase. CT MAXILLOFACIAL FINDINGS Osseous: --Complex facial fracture types: No LeFort, zygomaticomaxillary complex or nasoorbitoethmoidal fracture. --Simple fracture types: None. --Mandible: No fracture or other acute abnormality. Orbits: The globes appear intact. Normal appearance of the intra- and extraconal fat. Symmetric extraocular muscles. Sinuses: No fluid levels or advanced mucosal thickening. Soft tissues: Soft tissue swelling at the lower lip. CT CERVICAL SPINE FINDINGS Alignment: No static subluxation. Facets are aligned. Occipital condyles are normally positioned. Skull base and vertebrae: No acute fracture. Soft tissues and spinal canal: No prevertebral fluid or swelling. No visible canal hematoma. Disc levels: Multilevel degenerative disc disease and facet hypertrophy without advanced canal or neural foraminal stenosis. Upper chest: No pneumothorax, pulmonary nodule or pleural effusion. Other: Normal visualized paraspinal cervical soft tissues. IMPRESSION: 1. Small acute subdural hematoma along the left leaflet of the cerebellar tentorium, measuring 2 mm in thickness. No associated mass effect or hydrocephalus. 2. Lower  facial soft tissue swelling without acute facial fracture. 3. No acute fracture or static subluxation of the cervical spine. These results were called by telephone at the time of interpretation on 05/22/2016 at 10:48 pm to [facility], [place] , who verbally acknowledged these results. Electronically Signed   By: Ulyses Jarred M.D.   On: 05/22/2016 22:48   Dg Knee Complete 4 Views Right  Result Date: 05/22/2016 CLINICAL DATA:  Patient fell with right knee pain anteriorly, swelling and laceration. EXAM: RIGHT KNEE - COMPLETE 4+ VIEW COMPARISON:  None. FINDINGS: There is femorotibial joint space narrowing along the lateral aspect with spurring. Spurring is also noted of the tibial spine. No acute fracture or bone destruction. No joint effusion. Popliteal arteriosclerosis of incidental note. No intra-articular loose bodies. IMPRESSION: Lateral femorotibial joint space narrowing. No acute osseous abnormality. Electronically Signed   By: Ashley Royalty M.D.   On: 05/22/2016 22:45   Ct Maxillofacial Wo Contrast  Result Date: 05/22/2016 CLINICAL DATA:  Fall stairs.  Anticoagulated patient. EXAM: CT HEAD WITHOUT CONTRAST CT MAXILLOFACIAL WITHOUT CONTRAST CT CERVICAL SPINE WITHOUT CONTRAST TECHNIQUE: Multidetector CT imaging of the head, cervical spine, and maxillofacial structures were performed using the standard protocol without intravenous contrast. Multiplanar CT image reconstructions of the cervical spine and maxillofacial structures were also generated. COMPARISON:  None. FINDINGS: CT HEAD FINDINGS Brain: There is a small amount of subdural blood layering along the left tentorial leaflet. No CT evidence of acute cortical infarct. No intraparenchymal hemorrhage.  No midline shift or mass effect. There is periventricular hypoattenuation compatible with chronic microvascular disease. Vascular: Atherosclerotic calcification of the vertebral and internal carotid arteries at the skullbase. CT MAXILLOFACIAL FINDINGS  Osseous: --Complex facial fracture types: No LeFort, zygomaticomaxillary complex or nasoorbitoethmoidal fracture. --Simple fracture types: None. --Mandible: No fracture or other acute abnormality. Orbits: The globes appear intact. Normal appearance of the intra- and extraconal fat. Symmetric extraocular muscles. Sinuses: No fluid levels or advanced mucosal thickening. Soft tissues: Soft tissue swelling at the lower lip. CT CERVICAL SPINE FINDINGS Alignment: No static subluxation. Facets are aligned. Occipital condyles are normally positioned. Skull base and vertebrae: No acute fracture. Soft tissues and spinal canal: No prevertebral fluid or swelling. No visible canal hematoma. Disc levels: Multilevel degenerative disc disease and facet hypertrophy without advanced canal or neural foraminal stenosis. Upper chest: No pneumothorax, pulmonary nodule or pleural effusion. Other: Normal visualized paraspinal cervical soft tissues. IMPRESSION: 1. Small acute subdural hematoma along the left leaflet of the cerebellar tentorium, measuring 2 mm in thickness. No associated mass effect or hydrocephalus. 2. Lower facial soft tissue swelling without acute facial fracture. 3. No acute fracture or static subluxation of the cervical spine. These results were called by telephone at the time of interpretation on 05/22/2016 at 10:48 pm to [facility], [place] , who verbally acknowledged these results. Electronically Signed   By: Ulyses Jarred M.D.   On: 05/22/2016 22:48     Assessment/Plan: Patient who fell in the afternoon of December 25 in [place], his wife feels him back to [place], and he presented to the emergency room for evaluation. His history is notable for anticoagulant with Coumadin because of a history of DVT over 15 years ago, he also has a history of coronary angioplasty over 30 years ago. Workup was revealed dental injuries as well as laceration of his lower lip, but most significantly a thin left  tentorial subdural hematoma. It was recommended to the emergency staff to reverse the anticoagulation due to Coumadin and for the patient to be admitted to the critical care medicine service, with neurosurgery to follow as a Optometrist.  Patient will need to remain off of anticoagulation and antiplatelet agents for at least several weeks. He will need to follow-up with me in my office in a couple weeks after discharge from the hospital, with an updated CT the brain without contrast. Subsequent to that, I've explained the patient and his wife, that they will need to discuss with Dr. Brigitte Pulse, his primary physician, whether the benefits of resuming anticoagulation outweigh the risks in his situation.  I spoke with the patient and his wife regarding his condition, the CT scan findings, and our recommendation for treatment and care from neurosurgical perspective. I've recommended follow-up CT of the brain without contrast on the morning of December 27 (ordered).   Hosie Spangle, MD 05/23/2016, 12:16 AM

## 2016-05-23 NOTE — H&P (Signed)
PULMONARY / CRITICAL CARE MEDICINE   Name: Alejandro Russell MRN: [id number] DOB: [birth year]    ADMISSION DATE:  05/22/2016 CONSULTATION DATE:  05/22/16  REFERRING MD:  Sherwood Gambler  CHIEF COMPLAINT:  Fall  HISTORY OF PRESENT ILLNESS:  Alejandro Russell is a 82 y.o. 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 He presented to [facility] ED 12/25 after suffering a mechanical fall while in [place] earlier that afternoon.  He and his wife drove back to [place] and since he had persistent pain and bleeding from lip laceration, they decided to come to ED for further evaluation and management.  In ED, he hat CT of the brain which demonstrated some dental fractures, lip laceration, and most significantly a small left tentorial subdural hematoma.  INR was noted to be 3 (chronic coumadin).  Neurosurgery was consulted who recommended reversing INR and admitted to ICU under PCCM.  Pt and wife report he has not had any problems with recurrent DVT or other VTE and that nobody told them to ever stop the coumadin; hence why he has been on it for 14 years.  PAST MEDICAL HISTORY :  He  has a past medical history of Anemia; Anxiety; CAD (coronary artery disease); Chronic renal disease, stage III; Depression; Diabetes mellitus; Diverticulosis; Diverticulosis; DVT (deep venous thrombosis) ([place]); Hyperlipidemia; Hypertension; Myocardial infarct; Nonspecific abnormal finding in stool contents (09/09/2013); Obesity; OSA (obstructive sleep apnea); Peripheral neuropathy ([place]); Psoriasis; Tubulovillous adenoma of colon (1995); Type 2 diabetes mellitus ([place]); and Vitamin B12 deficiency.  PAST SURGICAL HISTORY: He  has a past surgical history that includes Cholecystectomy (may 16,2011); Coronary stent placement; Inguinal hernia repair; Cardiac catheterization; Esophagogastroduodenoscopy (egd) with propofol (N/A, 09/09/2013); and Colonoscopy with propofol (N/A, 09/09/2013).  No  Known Allergies  No current facility-administered medications on file prior to encounter.    Current Outpatient Prescriptions on File Prior to Encounter  Medication Sig  . B Complex-C (SUPER B COMPLEX PO) Take 1 capsule by mouth daily.  Marland Kitchen buPROPion (WELLBUTRIN XL) 300 MG 24 hr tablet Take 300 mg by mouth every morning.  . cyanocobalamin (,VITAMIN B-12,) 1000 MCG/ML injection Inject 1,000 mcg into the muscle every 30 (thirty) days.  . DENTA 5000 PLUS 1.1 % CREA dental cream Place 1 application onto teeth at bedtime.   . fenofibrate (TRICOR) 145 MG tablet Take 145 mg by mouth every evening.   . isosorbide mononitrate (IMDUR) 60 MG 24 hr tablet Take 60 mg by mouth every morning.   . metFORMIN (GLUCOPHAGE) 500 MG tablet Take 500 mg by mouth 2 (two) times daily.    . metoprolol (LOPRESSOR) 100 MG tablet Take 50 mg by mouth 2 (two) times daily.    . niacin (NIASPAN) 1000 MG CR tablet Take 1,000 mg by mouth at bedtime.    . Omega-3 Fatty Acids (FISH OIL PO) Take 1 capsule by mouth daily.   Marland Kitchen PARoxetine (PAXIL) 20 MG tablet Take 20 mg by mouth at bedtime.   . ramipril (ALTACE) 2.5 MG capsule Take 2.5 mg by mouth every evening.   . warfarin (COUMADIN) 3 MG tablet Take 1.5-3 mg by mouth daily. Takes 1.5mg  on mondays and wednesdays. Takes 1 tablet all other days    FAMILY HISTORY:  His indicated that the status of his mother is unknown. He indicated that the status of his father is unknown. He indicated that the status of his neg hx is unknown.    SOCIAL HISTORY: He  reports that he quit smoking about 38 years ago. His smoking use included Cigarettes. He has a 62.00 pack-year smoking history. He has never used smokeless tobacco. He reports that he does not drink alcohol or use drugs.  REVIEW OF SYSTEMS:   All negative; except for those that are bolded, which indicate positives.  Constitutional: weight loss, weight gain, night sweats, fevers, chills, fatigue, weakness.  HEENT: headaches, sore  throat, sneezing, nasal congestion, post nasal drip, difficulty swallowing, tooth/dental problems, visual complaints, visual changes, ear aches. Neuro: difficulty with speech, weakness, numbness, ataxia. CV:  chest pain, orthopnea, PND, swelling in lower extremities, dizziness, palpitations, syncope.  Resp: cough, hemoptysis, dyspnea, wheezing. GI: heartburn, indigestion, abdominal pain, nausea, vomiting, diarrhea, constipation, change in bowel habits, loss of appetite, hematemesis, melena, hematochezia.  GU: dysuria, change in color of urine, urgency or frequency, flank pain, hematuria. MSK: joint pain or swelling, decreased range of motion. Psych: change in mood or affect, depression, anxiety, suicidal ideations, homicidal ideations. Skin: rash, itching, bruising, lip bleeding and pain.   SUBJECTIVE:  No acute issues, no focal deficits.  VITAL SIGNS: BP 122/67   Pulse 66   Temp 97.5 F (36.4 C) (Oral)   Resp 18   SpO2 95%   HEMODYNAMICS:    VENTILATOR SETTINGS:    INTAKE / OUTPUT: No intake/output data recorded.   PHYSICAL EXAMINATION: General: Adult male, in NAD. Neuro: A&O x 3, non-focal.  HEENT: [place]/AT. PERRL, sclerae anicteric. Cardiovascular: RRR, no M/R/G.  Lungs: Respirations even and unlabored.  CTA bilaterally, No W/R/R. Abdomen: BS x 4, soft, NT/ND.  Musculoskeletal: No gross deformities, no edema.  Skin: Intact, warm, no rashes.  LABS:  BMET  Recent Labs Lab 05/22/16 2203  NA 140  K 4.3  CL 100*  BUN 27*  CREATININE 1.20  GLUCOSE 105*    Electrolytes No results for input(s): CALCIUM, MG, PHOS in the last 168 hours.  CBC  Recent Labs Lab 05/22/16 2203 05/23/16 0007  WBC  --  10.3  HGB 13.3 12.8*  HCT 39.0 38.1*  PLT  --  247    Coag's  Recent Labs Lab 05/22/16 2153  INR 3.02    Sepsis Markers No results for input(s): LATICACIDVEN, PROCALCITON, O2SATVEN in the last 168 hours.  ABG No results for input(s): PHART, PCO2ART,  PO2ART in the last 168 hours.  Liver Enzymes No results for input(s): AST, ALT, ALKPHOS, BILITOT, ALBUMIN in the last 168 hours.  Cardiac Enzymes No results for input(s): TROPONINI, PROBNP in the last 168 hours.  Glucose No results for input(s): GLUCAP in the last 168 hours.  Imaging Ct Head Wo Contrast  Result Date: 05/22/2016 CLINICAL DATA:  Fall stairs.  Anticoagulated patient. EXAM: CT HEAD WITHOUT CONTRAST CT MAXILLOFACIAL WITHOUT CONTRAST CT CERVICAL SPINE WITHOUT CONTRAST TECHNIQUE: Multidetector CT imaging of the head, cervical spine, and maxillofacial structures were performed using the standard protocol without intravenous contrast. Multiplanar CT image reconstructions of the cervical spine and maxillofacial structures were also generated. COMPARISON:  None. FINDINGS: CT HEAD FINDINGS Brain: There is a small amount of subdural blood layering along the left tentorial leaflet. No CT evidence of acute cortical infarct. No intraparenchymal hemorrhage. No midline shift or mass effect. There is periventricular hypoattenuation compatible with chronic microvascular disease. Vascular: Atherosclerotic calcification of the vertebral and internal carotid arteries at the skullbase. CT MAXILLOFACIAL FINDINGS Osseous: --Complex facial fracture types: No LeFort, zygomaticomaxillary complex or nasoorbitoethmoidal fracture. --Simple fracture types: None. --Mandible: No fracture or other acute abnormality. Orbits:  The globes appear intact. Normal appearance of the intra- and extraconal fat. Symmetric extraocular muscles. Sinuses: No fluid levels or advanced mucosal thickening. Soft tissues: Soft tissue swelling at the lower lip. CT CERVICAL SPINE FINDINGS Alignment: No static subluxation. Facets are aligned. Occipital condyles are normally positioned. Skull base and vertebrae: No acute fracture. Soft tissues and spinal canal: No prevertebral fluid or swelling. No visible canal hematoma. Disc levels: Multilevel  degenerative disc disease and facet hypertrophy without advanced canal or neural foraminal stenosis. Upper chest: No pneumothorax, pulmonary nodule or pleural effusion. Other: Normal visualized paraspinal cervical soft tissues. IMPRESSION: 1. Small acute subdural hematoma along the left leaflet of the cerebellar tentorium, measuring 2 mm in thickness. No associated mass effect or hydrocephalus. 2. Lower facial soft tissue swelling without acute facial fracture. 3. No acute fracture or static subluxation of the cervical spine. These results were called by telephone at the time of interpretation on 05/22/2016 at 10:48 pm to [facility] , who verbally acknowledged these results. Electronically Signed   By: Ulyses Jarred M.D.   On: 05/22/2016 22:48   Ct Cervical Spine Wo Contrast  Result Date: 05/22/2016 CLINICAL DATA:  Fall stairs.  Anticoagulated patient. EXAM: CT HEAD WITHOUT CONTRAST CT MAXILLOFACIAL WITHOUT CONTRAST CT CERVICAL SPINE WITHOUT CONTRAST TECHNIQUE: Multidetector CT imaging of the head, cervical spine, and maxillofacial structures were performed using the standard protocol without intravenous contrast. Multiplanar CT image reconstructions of the cervical spine and maxillofacial structures were also generated. COMPARISON:  None. FINDINGS: CT HEAD FINDINGS Brain: There is a small amount of subdural blood layering along the left tentorial leaflet. No CT evidence of acute cortical infarct. No intraparenchymal hemorrhage. No midline shift or mass effect. There is periventricular hypoattenuation compatible with chronic microvascular disease. Vascular: Atherosclerotic calcification of the vertebral and internal carotid arteries at the skullbase. CT MAXILLOFACIAL FINDINGS Osseous: --Complex facial fracture types: No LeFort, zygomaticomaxillary complex or nasoorbitoethmoidal fracture. --Simple fracture types: None. --Mandible: No fracture or other acute abnormality. Orbits: The globes appear  intact. Normal appearance of the intra- and extraconal fat. Symmetric extraocular muscles. Sinuses: No fluid levels or advanced mucosal thickening. Soft tissues: Soft tissue swelling at the lower lip. CT CERVICAL SPINE FINDINGS Alignment: No static subluxation. Facets are aligned. Occipital condyles are normally positioned. Skull base and vertebrae: No acute fracture. Soft tissues and spinal canal: No prevertebral fluid or swelling. No visible canal hematoma. Disc levels: Multilevel degenerative disc disease and facet hypertrophy without advanced canal or neural foraminal stenosis. Upper chest: No pneumothorax, pulmonary nodule or pleural effusion. Other: Normal visualized paraspinal cervical soft tissues. IMPRESSION: 1. Small acute subdural hematoma along the left leaflet of the cerebellar tentorium, measuring 2 mm in thickness. No associated mass effect or hydrocephalus. 2. Lower facial soft tissue swelling without acute facial fracture. 3. No acute fracture or static subluxation of the cervical spine. These results were called by telephone at the time of interpretation on 05/22/2016 at 10:48 pm to [facility] [place] , who verbally acknowledged these results. Electronically Signed   By: Ulyses Jarred M.D.   On: 05/22/2016 22:48   Dg Knee Complete 4 Views Right  Result Date: 05/22/2016 CLINICAL DATA:  Patient fell with right knee pain anteriorly, swelling and laceration. EXAM: RIGHT KNEE - COMPLETE 4+ VIEW COMPARISON:  None. FINDINGS: There is femorotibial joint space narrowing along the lateral aspect with spurring. Spurring is also noted of the tibial spine. No acute fracture or bone destruction. No joint effusion. Popliteal arteriosclerosis of incidental  note. No intra-articular loose bodies. IMPRESSION: Lateral femorotibial joint space narrowing. No acute osseous abnormality. Electronically Signed   By: Ashley Royalty M.D.   On: 05/22/2016 22:45   Ct Maxillofacial Wo Contrast  Result Date:  05/22/2016 CLINICAL DATA:  Fall stairs.  Anticoagulated patient. EXAM: CT HEAD WITHOUT CONTRAST CT MAXILLOFACIAL WITHOUT CONTRAST CT CERVICAL SPINE WITHOUT CONTRAST TECHNIQUE: Multidetector CT imaging of the head, cervical spine, and maxillofacial structures were performed using the standard protocol without intravenous contrast. Multiplanar CT image reconstructions of the cervical spine and maxillofacial structures were also generated. COMPARISON:  None. FINDINGS: CT HEAD FINDINGS Brain: There is a small amount of subdural blood layering along the left tentorial leaflet. No CT evidence of acute cortical infarct. No intraparenchymal hemorrhage. No midline shift or mass effect. There is periventricular hypoattenuation compatible with chronic microvascular disease. Vascular: Atherosclerotic calcification of the vertebral and internal carotid arteries at the skullbase. CT MAXILLOFACIAL FINDINGS Osseous: --Complex facial fracture types: No LeFort, zygomaticomaxillary complex or nasoorbitoethmoidal fracture. --Simple fracture types: None. --Mandible: No fracture or other acute abnormality. Orbits: The globes appear intact. Normal appearance of the intra- and extraconal fat. Symmetric extraocular muscles. Sinuses: No fluid levels or advanced mucosal thickening. Soft tissues: Soft tissue swelling at the lower lip. CT CERVICAL SPINE FINDINGS Alignment: No static subluxation. Facets are aligned. Occipital condyles are normally positioned. Skull base and vertebrae: No acute fracture. Soft tissues and spinal canal: No prevertebral fluid or swelling. No visible canal hematoma. Disc levels: Multilevel degenerative disc disease and facet hypertrophy without advanced canal or neural foraminal stenosis. Upper chest: No pneumothorax, pulmonary nodule or pleural effusion. Other: Normal visualized paraspinal cervical soft tissues. IMPRESSION: 1. Small acute subdural hematoma along the left leaflet of the cerebellar tentorium,  measuring 2 mm in thickness. No associated mass effect or hydrocephalus. 2. Lower facial soft tissue swelling without acute facial fracture. 3. No acute fracture or static subluxation of the cervical spine. These results were called by telephone at the time of interpretation on 05/22/2016 at 10:48 pm to [facility] , who verbally acknowledged these results. Electronically Signed   By: Ulyses Jarred M.D.   On: 05/22/2016 22:48     STUDIES:  CT head 12/25 > small acute SDH along left tentorium.  Lower facial soft tissue sweling.  CULTURES: None.  ANTIBIOTICS: None.  SIGNIFICANT EVENTS: 12/25 > admit.  LINES/TUBES: None.  DISCUSSION: 82 y.o. male who had mechanical fall with subsequent small SDH.  He is on chronic coumadin for remote DVT in 2003.  He was coagulopathic with INR 3 which was reversed with K-centra.  PCCM asked to admit.  ASSESSMENT / PLAN:  NEUROLOGIC A:   Small acute SDH - after suffering mechanical fall.  Also on anticoagulation (coumadin) due to DVT in 2003. Hx anxiety, depression. P:   Hold coumadin (now s/p K-Centra). See cardiovascular section. Neurosurgery following, further recs / management per them. Hold preadmission bupropion, paroxetine.  PULMONARY A: OSA - on CPAP. P:   Hold CPAP for now given lip bleeding. Can start CPAP 12/26 once controlled.  CARDIOVASCULAR A:  Hx DVT in 2003 - has been on coumadin since then as nobody had mentioned stopping this.  No problems with recurrent VTE per wife and pt. Hz HTN, HLD, CAD, MI. P:  Hold coumadin given SDH. Assess LE duplex - would imagine that he has NO DVT at this point given chronic anticoagulation for 14 years.  If so can likely stop coumadin. Monitor hemodynamics. Hydralazine PRN. Hold  preadmission fenofibrate, imdur, metoprolol, ramipril, rosuvastatin, warfarin.  RENAL A:   No acute issues. P:   NS @ 75. BMP in AM.  GASTROINTESTINAL A:   Nutrition. P:    NPO.  HEMATOLOGIC A:   Coagulopathy - s/p K-Centra in ED. On chronic coumadin  VTE Prophylaxis. P:  See cardiovascular section. SCD's only. CBC in AM.  INFECTIOUS A:   No indication of infection. P:   Monitor clinically.  ENDOCRINE A:   DM. P:   SSI. Hold preadmission metformin.  Family updated: Wife updated at bedside.  Interdisciplinary Family Meeting v Palliative Care Meeting:  Due by: 05/30/15.  CC time: 35 minutes.   Montey Hora, [facility] Pager: [phone]  or [phone] 05/23/2016, 12:40 AM

## 2016-05-23 NOTE — Progress Notes (Addendum)
PULMONARY / CRITICAL CARE MEDICINE   Name: Alejandro Russell MRN: [id number] DOB: [birth year]    ADMISSION DATE:  05/22/2016 CONSULTATION DATE:  05/22/16  REFERRING MD:  Sherwood Gambler  CHIEF COMPLAINT:  Fall  HISTORY OF PRESENT ILLNESS:  Alejandro Russell is a 82 y.o. 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 He presented to [facility] ED 12/25 after suffering a mechanical fall while in [place] earlier that afternoon.  He and his wife drove back to [place] and since he had persistent pain and bleeding from lip laceration, they decided to come to ED for further evaluation and management.  In ED, he hat CT of the brain which demonstrated some dental fractures, lip laceration, and most significantly a small left tentorial subdural hematoma.  INR was noted to be 3 (chronic coumadin).  Neurosurgery was consulted who recommended reversing INR and admitted to ICU under PCCM.  Pt and wife report he has not had any problems with recurrent DVT or other VTE and that nobody told them to ever stop the coumadin; hence why he has been on it for 14 years.  SUBJECTIVE:  No acute events overnight, no new complaints  VITAL SIGNS: BP (!) 146/74 (BP Location: Right Arm)   Pulse 62   Temp 98.5 F (36.9 C) (Oral)   Resp 17   Wt 90.3 kg (199 lb 1.6 oz)   SpO2 96%   BMI 27.00 kg/m   HEMODYNAMICS:    VENTILATOR SETTINGS:    INTAKE / OUTPUT: I/O last 3 completed shifts: In: 392.5 [I.V.:392.5] Out: 300 [Urine:300]   PHYSICAL EXAMINATION: General: Adult male, in NAD. Neuro: A&O x 3, non-focal.  HEENT: [place]/AT. PERRL, sclerae anicteric. Cardiovascular: RRR, no M/R/G.  Lungs: Respirations even and unlabored.  CTA bilaterally, No W/R/R. Abdomen: BS x 4, soft, NT/ND.  Musculoskeletal: No gross deformities, no edema.  Skin: Intact, warm, no rashes.  LABS:  BMET  Recent Labs Lab 05/22/16 2203 05/23/16 0007 05/23/16 0437  NA 140 138 138  K 4.3 4.0  4.2  CL 100* 104 103  CO2  --  26 26  BUN 27* 23* 22*  CREATININE 1.20 1.24 1.21  GLUCOSE 105* 96 99   Electrolytes  Recent Labs Lab 05/23/16 0007 05/23/16 0437  CALCIUM 10.7* 10.7*  MG  --  1.6*  PHOS  --  2.3*   CBC  Recent Labs Lab 05/22/16 2203 05/23/16 0007 05/23/16 0437  WBC  --  10.3 8.8  HGB 13.3 12.8* 12.5*  HCT 39.0 38.1* 37.5*  PLT  --  247 236   Coag's  Recent Labs Lab 05/23/16 0120 05/23/16 0437 05/23/16 0805  INR 1.16 1.14 1.17   Sepsis Markers No results for input(s): LATICACIDVEN, PROCALCITON, O2SATVEN in the last 168 hours.  ABG No results for input(s): PHART, PCO2ART, PO2ART in the last 168 hours.  Liver Enzymes No results for input(s): AST, ALT, ALKPHOS, BILITOT, ALBUMIN in the last 168 hours.  Cardiac Enzymes No results for input(s): TROPONINI, PROBNP in the last 168 hours.  Glucose  Recent Labs Lab 05/23/16 0450 05/23/16 0819  GLUCAP 83 89   Imaging Ct Head Wo Contrast  Result Date: 05/22/2016 CLINICAL DATA:  Fall stairs.  Anticoagulated patient. EXAM: CT HEAD WITHOUT CONTRAST CT MAXILLOFACIAL WITHOUT CONTRAST CT CERVICAL SPINE WITHOUT CONTRAST TECHNIQUE: Multidetector CT imaging of the head, cervical spine, and maxillofacial structures were performed using the standard protocol without intravenous contrast. Multiplanar CT image reconstructions  of the cervical spine and maxillofacial structures were also generated. COMPARISON:  None. FINDINGS: CT HEAD FINDINGS Brain: There is a small amount of subdural blood layering along the left tentorial leaflet. No CT evidence of acute cortical infarct. No intraparenchymal hemorrhage. No midline shift or mass effect. There is periventricular hypoattenuation compatible with chronic microvascular disease. Vascular: Atherosclerotic calcification of the vertebral and internal carotid arteries at the skullbase. CT MAXILLOFACIAL FINDINGS Osseous: --Complex facial fracture types: No LeFort,  zygomaticomaxillary complex or nasoorbitoethmoidal fracture. --Simple fracture types: None. --Mandible: No fracture or other acute abnormality. Orbits: The globes appear intact. Normal appearance of the intra- and extraconal fat. Symmetric extraocular muscles. Sinuses: No fluid levels or advanced mucosal thickening. Soft tissues: Soft tissue swelling at the lower lip. CT CERVICAL SPINE FINDINGS Alignment: No static subluxation. Facets are aligned. Occipital condyles are normally positioned. Skull base and vertebrae: No acute fracture. Soft tissues and spinal canal: No prevertebral fluid or swelling. No visible canal hematoma. Disc levels: Multilevel degenerative disc disease and facet hypertrophy without advanced canal or neural foraminal stenosis. Upper chest: No pneumothorax, pulmonary nodule or pleural effusion. Other: Normal visualized paraspinal cervical soft tissues. IMPRESSION: 1. Small acute subdural hematoma along the left leaflet of the cerebellar tentorium, measuring 2 mm in thickness. No associated mass effect or hydrocephalus. 2. Lower facial soft tissue swelling without acute facial fracture. 3. No acute fracture or static subluxation of the cervical spine. These results were called by telephone at the time of interpretation on 05/22/2016 at 10:48 pm to [facility] [place] , who verbally acknowledged these results. Electronically Signed   By: Ulyses Jarred M.D.   On: 05/22/2016 22:48   Ct Cervical Spine Wo Contrast  Result Date: 05/22/2016 CLINICAL DATA:  Fall stairs.  Anticoagulated patient. EXAM: CT HEAD WITHOUT CONTRAST CT MAXILLOFACIAL WITHOUT CONTRAST CT CERVICAL SPINE WITHOUT CONTRAST TECHNIQUE: Multidetector CT imaging of the head, cervical spine, and maxillofacial structures were performed using the standard protocol without intravenous contrast. Multiplanar CT image reconstructions of the cervical spine and maxillofacial structures were also generated. COMPARISON:  None. FINDINGS: CT  HEAD FINDINGS Brain: There is a small amount of subdural blood layering along the left tentorial leaflet. No CT evidence of acute cortical infarct. No intraparenchymal hemorrhage. No midline shift or mass effect. There is periventricular hypoattenuation compatible with chronic microvascular disease. Vascular: Atherosclerotic calcification of the vertebral and internal carotid arteries at the skullbase. CT MAXILLOFACIAL FINDINGS Osseous: --Complex facial fracture types: No LeFort, zygomaticomaxillary complex or nasoorbitoethmoidal fracture. --Simple fracture types: None. --Mandible: No fracture or other acute abnormality. Orbits: The globes appear intact. Normal appearance of the intra- and extraconal fat. Symmetric extraocular muscles. Sinuses: No fluid levels or advanced mucosal thickening. Soft tissues: Soft tissue swelling at the lower lip. CT CERVICAL SPINE FINDINGS Alignment: No static subluxation. Facets are aligned. Occipital condyles are normally positioned. Skull base and vertebrae: No acute fracture. Soft tissues and spinal canal: No prevertebral fluid or swelling. No visible canal hematoma. Disc levels: Multilevel degenerative disc disease and facet hypertrophy without advanced canal or neural foraminal stenosis. Upper chest: No pneumothorax, pulmonary nodule or pleural effusion. Other: Normal visualized paraspinal cervical soft tissues. IMPRESSION: 1. Small acute subdural hematoma along the left leaflet of the cerebellar tentorium, measuring 2 mm in thickness. No associated mass effect or hydrocephalus. 2. Lower facial soft tissue swelling without acute facial fracture. 3. No acute fracture or static subluxation of the cervical spine. These results were called by telephone at the time of interpretation on  05/22/2016 at 10:48 pm to [facility] [place] , who verbally acknowledged these results. Electronically Signed   By: Ulyses Jarred M.D.   On: 05/22/2016 22:48   Dg Knee Complete 4 Views  Right  Result Date: 05/22/2016 CLINICAL DATA:  Patient fell with right knee pain anteriorly, swelling and laceration. EXAM: RIGHT KNEE - COMPLETE 4+ VIEW COMPARISON:  None. FINDINGS: There is femorotibial joint space narrowing along the lateral aspect with spurring. Spurring is also noted of the tibial spine. No acute fracture or bone destruction. No joint effusion. Popliteal arteriosclerosis of incidental note. No intra-articular loose bodies. IMPRESSION: Lateral femorotibial joint space narrowing. No acute osseous abnormality. Electronically Signed   By: Ashley Royalty M.D.   On: 05/22/2016 22:45   Ct Maxillofacial Wo Contrast  Result Date: 05/22/2016 CLINICAL DATA:  Fall stairs.  Anticoagulated patient. EXAM: CT HEAD WITHOUT CONTRAST CT MAXILLOFACIAL WITHOUT CONTRAST CT CERVICAL SPINE WITHOUT CONTRAST TECHNIQUE: Multidetector CT imaging of the head, cervical spine, and maxillofacial structures were performed using the standard protocol without intravenous contrast. Multiplanar CT image reconstructions of the cervical spine and maxillofacial structures were also generated. COMPARISON:  None. FINDINGS: CT HEAD FINDINGS Brain: There is a small amount of subdural blood layering along the left tentorial leaflet. No CT evidence of acute cortical infarct. No intraparenchymal hemorrhage. No midline shift or mass effect. There is periventricular hypoattenuation compatible with chronic microvascular disease. Vascular: Atherosclerotic calcification of the vertebral and internal carotid arteries at the skullbase. CT MAXILLOFACIAL FINDINGS Osseous: --Complex facial fracture types: No LeFort, zygomaticomaxillary complex or nasoorbitoethmoidal fracture. --Simple fracture types: None. --Mandible: No fracture or other acute abnormality. Orbits: The globes appear intact. Normal appearance of the intra- and extraconal fat. Symmetric extraocular muscles. Sinuses: No fluid levels or advanced mucosal thickening. Soft tissues:  Soft tissue swelling at the lower lip. CT CERVICAL SPINE FINDINGS Alignment: No static subluxation. Facets are aligned. Occipital condyles are normally positioned. Skull base and vertebrae: No acute fracture. Soft tissues and spinal canal: No prevertebral fluid or swelling. No visible canal hematoma. Disc levels: Multilevel degenerative disc disease and facet hypertrophy without advanced canal or neural foraminal stenosis. Upper chest: No pneumothorax, pulmonary nodule or pleural effusion. Other: Normal visualized paraspinal cervical soft tissues. IMPRESSION: 1. Small acute subdural hematoma along the left leaflet of the cerebellar tentorium, measuring 2 mm in thickness. No associated mass effect or hydrocephalus. 2. Lower facial soft tissue swelling without acute facial fracture. 3. No acute fracture or static subluxation of the cervical spine. These results were called by telephone at the time of interpretation on 05/22/2016 at 10:48 pm to [facility] [place] , who verbally acknowledged these results. Electronically Signed   By: Ulyses Jarred M.D.   On: 05/22/2016 22:48     STUDIES:  CT head 12/25 > small acute SDH along left tentorium.  Lower facial soft tissue sweling.  CULTURES: None.  ANTIBIOTICS: None.  SIGNIFICANT EVENTS: 12/25 > admit.  LINES/TUBES: None.  I reviewed CT of the brain myself, minimal bleeding noted.  DISCUSSION: 82 y.o. male who had mechanical fall with subsequent small SDH.  He is on chronic coumadin for remote DVT in 2003.  He was coagulopathic with INR 3 which was reversed with K-centra.  PCCM asked to admit.  ASSESSMENT / PLAN:  NEUROLOGIC A:   Small acute SDH - after suffering mechanical fall.  Also on anticoagulation (coumadin) due to DVT in 2003. Hx anxiety, depression. P:   Hold coumadin (now s/p K-Centra). INR in AM See  cardiovascular section. Neurosurgery following, recommend f/u as outpatient Hold preadmission bupropion,  paroxetine.  PULMONARY A: OSA - on CPAP. P:   Hold CPAP for now given lip bleeding. Start CPAP 12/26 once controlled QHS but via nasal mask.  CARDIOVASCULAR A:  Hx DVT in 2003 - has been on coumadin since then as nobody had mentioned stopping this.  No problems with recurrent VTE per wife and pt. Hz HTN, HLD, CAD, MI. P:  Hold coumadin given SDH. Assess LE duplex - would imagine that he has NO DVT at this point given chronic anticoagulation for 14 years.  If so can likely stop coumadin. Monitor hemodynamics. Hydralazine PRN. Hold preadmission fenofibrate, imdur, metoprolol, ramipril, rosuvastatin, warfarin for now, will re-evaluate in AM  RENAL A:   HypoMag P:   KVO IVF. BMP in AM. Replace electrolytes as indicated  GASTROINTESTINAL A:   Nutrition. P:   Heart healthy diabetic diet  HEMATOLOGIC A:   Coagulopathy - s/p K-Centra in ED. On chronic coumadin  VTE Prophylaxis. P:  See cardiovascular section. SCD's only. CBC in AM.  INFECTIOUS A:   No indication of infection. P:   Monitor clinically.  ENDOCRINE A:   DM. P:   SSI. Hold preadmission metformin.  Discussed with NS, ok to transfer to SDU and to [facility] service with PCCM off 12/27.  Family updated: Patient updated at bedside.  Interdisciplinary Family Meeting v Palliative Care Meeting:  Due by: 05/30/15.  The patient is critically ill with multiple organ systems failure and requires high complexity decision making for assessment and support, frequent evaluation and titration of therapies, application of advanced monitoring technologies and extensive interpretation of multiple databases.   Critical Care Time devoted to patient care services described in this note is  50  Minutes. This time reflects time of care of this signee Dr Jennet Maduro. This critical care time does not reflect procedure time, or teaching time or supervisory time of PA/NP/Med student/Med Resident etc but could involve care  discussion time.  Rush Farmer, M.D. [facility] Pulmonary/Critical Care Medicine. Pager: [phone]. After hours pager: [phone].

## 2016-05-24 ENCOUNTER — Inpatient Hospital Stay (HOSPITAL_COMMUNITY): Payer: Medicare Other

## 2016-05-24 DIAGNOSIS — W19XXXA Unspecified fall, initial encounter: Secondary | ICD-10-CM

## 2016-05-24 LAB — GLUCOSE, CAPILLARY
GLUCOSE-CAPILLARY: 110 mg/dL — AB (ref 65–99)
GLUCOSE-CAPILLARY: 99 mg/dL (ref 65–99)
Glucose-Capillary: 94 mg/dL (ref 65–99)
Glucose-Capillary: 135 mg/dL — ABNORMAL HIGH (ref 65–99)
Glucose-Capillary: 130 mg/dL — ABNORMAL HIGH (ref 65–99)
Glucose-Capillary: 176 mg/dL — ABNORMAL HIGH (ref 65–99)

## 2016-05-24 LAB — BASIC METABOLIC PANEL
ANION GAP: 4 — AB (ref 5–15)
BUN: 16 mg/dL (ref 6–20)
CHLORIDE: 107 mmol/L (ref 101–111)
CO2: 27 mmol/L (ref 22–32)
Calcium: 10.1 mg/dL (ref 8.9–10.3)
Creatinine, Ser: 1.03 mg/dL (ref 0.61–1.24)
GFR calc Af Amer: >60 mL/min (ref >60)
GLUCOSE: 98 mg/dL (ref 65–99)
POTASSIUM: 4.1 mmol/L (ref 3.5–5.1)
Sodium: 138 mmol/L (ref 135–145)

## 2016-05-24 LAB — CBC
HCT: 38.3 % — ABNORMAL LOW (ref 39.0–52.0)
Hemoglobin: 12.5 g/dL — ABNORMAL LOW (ref 13.0–17.0)
MCH: 30 pg (ref 26.0–34.0)
MCHC: 32.6 g/dL (ref 30.0–36.0)
MCV: 91.8 fL (ref 78.0–100.0)
Platelets: 232 10*3/uL (ref 150–400)
RBC: 4.17 MIL/uL — AB (ref 4.22–5.81)
RDW: 15 % (ref 11.5–15.5)
WBC: 7.8 10*3/uL (ref 4.0–10.5)

## 2016-05-24 LAB — MAGNESIUM: MAGNESIUM: 1.9 mg/dL (ref 1.7–2.4)

## 2016-05-24 LAB — PROTIME-INR
INR: 1.04
PROTHROMBIN TIME: 13.6 s (ref 11.4–15.2)

## 2016-05-24 LAB — PHOSPHORUS: Phosphorus: 2.7 mg/dL (ref 2.5–4.6)

## 2016-05-24 MED ORDER — ROSUVASTATIN CALCIUM 40 MG PO TABS
40.0000 mg | ORAL_TABLET | Freq: Every day | ORAL | Status: DC
  Administered 2016-05-24: 40 mg via ORAL
  Filled 2016-05-24: qty 1

## 2016-05-24 MED ORDER — BUPROPION HCL ER (XL) 150 MG PO TB24
300.0000 mg | ORAL_TABLET | Freq: Every day | ORAL | Status: DC
  Administered 2016-05-24 – 2016-05-25 (×2): 300 mg via ORAL
  Filled 2016-05-24 (×2): qty 2

## 2016-05-24 MED ORDER — METOPROLOL TARTRATE 50 MG PO TABS
50.0000 mg | ORAL_TABLET | Freq: Two times a day (BID) | ORAL | Status: DC
  Administered 2016-05-24 – 2016-05-25 (×2): 50 mg via ORAL
  Filled 2016-05-24 (×3): qty 1

## 2016-05-24 NOTE — Progress Notes (Signed)
PROGRESS NOTE    TUDOR LEGNER  ZDG:[id number] DOB: February 03, 1928 DOA: 05/22/2016 PCP: Martha Clan, MD   Brief Narrative: 82 y.o. 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 He presented to [facility] ED 12/25 after suffering a mechanical fall while in [place]. He and his wife drove back to [place] and since he had persistent pain and bleeding from lip laceration, they decided to come to ED for further evaluation and management.In ED, he had CT of the brain which demonstrated some dental fractures, lip laceration, and most significantly a small left tentorial subdural hematoma.INR was noted to be 3 (chronic coumadin).  Neurosurgery was consulted who recommended reversing INR and admitted to ICU under PCCM.Pt and wife report he has not had any problems with recurrent DVT or other VTE and that nobody told them to ever stop the coumadin; hence why he has been on it for 14 years. PCCM transferred to [facility] on 05/24/2016.  Assessment & Plan:   Active Problems:   SDH (subdural hematoma) (HCC)   Supratherapeutic INR   Anticoagulated on Coumadin   Subdural hematoma (HCC)   Hyperglycemia  #Acute subdural hematoma after a mechanical fall while on chronic anticoagulation: -Evaluated by neurosurgery. Repeat CT scan of head today. -Follow-up neurosurgery plan. -Holding Coumadin. -Monitor neuro checks -PT, OT evaluation and supportive care.  #History of obstructive sleep apnea on CPAP: Continue CPAP and outpatient follow-up.  #History of DVT in 2003 has been on Coumadin since then. No problem with recurrent DVT. Repeat ultrasound of lower extremities with no DVT. Plan to discontinue, and this time.  #History of hypertension, hyperlipidemia, coronary artery disease: Blood pressure acceptable. Continue to monitor closely.  -Resumed, statin, Wellbutrin..  Continue current medical and supportive care  DVT prophylaxis: SCD. No systemic  anticoagulation Code Status: Full code Family Communication: Wife at bedside Disposition Plan: Likely discharge home tomorrow.  Consultants:   Neurosurgery  Procedures: None Antimicrobials: None  Subjective: Patient was seen and examined at bedside. Patient feels weak tired. Denied pain, nausea, vomiting, chest pain or shortness of breath.   Objective: Vitals:   05/24/16 0405 05/24/16 0500 05/24/16 0700 05/24/16 1206  BP: 139/62  (!) 135/55 130/69  Pulse: 74  75 78  Resp: 15  19 14   Temp: 98.1 F (36.7 C)  98.8 F (37.1 C) 97.6 F (36.4 C)  TempSrc: Oral  Oral Oral  SpO2: 98%  99% 97%  Weight:  89.9 kg (198 lb 3.1 oz)      Intake/Output Summary (Last 24 hours) at 05/24/16 1334 Last data filed at 05/24/16 0700  Gross per 24 hour  Intake             1200 ml  Output             1575 ml  Net             -375 ml   Filed Weights   05/23/16 0428 05/24/16 0500  Weight: 90.3 kg (199 lb 1.6 oz) 89.9 kg (198 lb 3.1 oz)    Examination:  General exam: Appears calm and comfortable, Lower lip with dry blood. No swelling or discharge. Respiratory system: Clear to auscultation. Respiratory effort normal. No wheezing or crackle Cardiovascular system: S1 & S2 heard, RRR.  No pedal edema. Gastrointestinal system: Abdomen is nondistended, soft and nontender. Normal bowel sounds heard. Central nervous system: Alert and oriented. No focal neurological deficits. Extremities: Symmetric 5 x 5 power. Skin:  No rashes, lesions or ulcers Psychiatry: Judgement and insight appear normal. Mood & affect appropriate.     Data Reviewed: I have personally reviewed following labs and imaging studies  CBC:  Recent Labs Lab 05/22/16 2203 05/23/16 0007 05/23/16 0437 05/24/16 0446  WBC  --  10.3 8.8 7.8  HGB 13.3 12.8* 12.5* 12.5*  HCT 39.0 38.1* 37.5* 38.3*  MCV  --  92.7 92.8 91.8  PLT  --  247 236 232   Basic Metabolic Panel:  Recent Labs Lab 05/22/16 2203 05/23/16 0007  05/23/16 0437 05/24/16 0446  NA 140 138 138 138  K 4.3 4.0 4.2 4.1  CL 100* 104 103 107  CO2  --  26 26 27   GLUCOSE 105* 96 99 98  BUN 27* 23* 22* 16  CREATININE 1.20 1.24 1.21 1.03  CALCIUM  --  10.7* 10.7* 10.1  MG  --   --  1.6* 1.9  PHOS  --   --  2.3* 2.7   GFR: Estimated Creatinine Clearance: 56.5 mL/min (by C-G formula based on SCr of 1.03 mg/dL). Liver Function Tests: No results for input(s): AST, ALT, ALKPHOS, BILITOT, PROT, ALBUMIN in the last 168 hours. No results for input(s): LIPASE, AMYLASE in the last 168 hours. No results for input(s): AMMONIA in the last 168 hours. Coagulation Profile:  Recent Labs Lab 05/22/16 2153 05/23/16 0120 05/23/16 0437 05/23/16 0805 05/24/16 0446  INR 3.02 1.16 1.14 1.17 1.04   Cardiac Enzymes: No results for input(s): CKTOTAL, CKMB, CKMBINDEX, TROPONINI in the last 168 hours. BNP (last 3 results) No results for input(s): PROBNP in the last 8760 hours. HbA1C:  Recent Labs  05/23/16 0437  HGBA1C 5.9*   CBG:  Recent Labs Lab 05/23/16 2117 05/23/16 2340 05/24/16 0407 05/24/16 0845 05/24/16 1204  GLUCAP 120* 129* 94 99 135*   Lipid Profile: No results for input(s): CHOL, HDL, LDLCALC, TRIG, CHOLHDL, LDLDIRECT in the last 72 hours. Thyroid Function Tests: No results for input(s): TSH, T4TOTAL, FREET4, T3FREE, THYROIDAB in the last 72 hours. Anemia Panel: No results for input(s): VITAMINB12, FOLATE, FERRITIN, TIBC, IRON, RETICCTPCT in the last 72 hours. Sepsis Labs: No results for input(s): PROCALCITON, LATICACIDVEN in the last 168 hours.  Recent Results (from the past 240 hour(s))  MRSA PCR Screening     Status: None   Collection Time: 05/23/16  4:19 AM  Result Value Ref Range Status   MRSA by PCR NEGATIVE NEGATIVE Final    Comment:        The GeneXpert MRSA Assay (FDA approved for NASAL specimens only), is one component of a comprehensive MRSA colonization surveillance program. It is not intended to  diagnose MRSA infection nor to guide or monitor treatment for MRSA infections.          Radiology Studies: Ct Head Wo Contrast  Result Date: 05/24/2016 CLINICAL DATA:  On Coumadin, recent fall EXAM: CT HEAD WITHOUT CONTRAST TECHNIQUE: Contiguous axial images were obtained from the base of the skull through the vertex without intravenous contrast. COMPARISON:  CT brain scan of 05/22/2016 FINDINGS: Brain: The small amount of subdural blood previously noted along the left tentorium has slightly diminished in overall volume. No new subdural or subarachnoid collection of blood is seen. Diffuse changes of atrophy noted and the ventricles are somewhat prominent. Mild small vessel ischemic change again is noted throughout the periventricular white matter. No acute changes evident. No mass-effect is seen. Vascular: No vascular abnormality is seen on this unenhanced study. Skull: No acute  calvarial abnormality is noted. Sinuses/Orbits: The paranasal sinuses are well pneumatized. Other: None IMPRESSION: Interval decrease in small left tentorial subdural hematoma. No other abnormality. Diffuse atrophy and small vessel disease again noted. Electronically Signed   By: Dwyane Dee M.D.   On: 05/24/2016 09:38   Ct Head Wo Contrast  Result Date: 05/22/2016 CLINICAL DATA:  Fall stairs.  Anticoagulated patient. EXAM: CT HEAD WITHOUT CONTRAST CT MAXILLOFACIAL WITHOUT CONTRAST CT CERVICAL SPINE WITHOUT CONTRAST TECHNIQUE: Multidetector CT imaging of the head, cervical spine, and maxillofacial structures were performed using the standard protocol without intravenous contrast. Multiplanar CT image reconstructions of the cervical spine and maxillofacial structures were also generated. COMPARISON:  None. FINDINGS: CT HEAD FINDINGS Brain: There is a small amount of subdural blood layering along the left tentorial leaflet. No CT evidence of acute cortical infarct. No intraparenchymal hemorrhage. No midline shift or mass  effect. There is periventricular hypoattenuation compatible with chronic microvascular disease. Vascular: Atherosclerotic calcification of the vertebral and internal carotid arteries at the skullbase. CT MAXILLOFACIAL FINDINGS Osseous: --Complex facial fracture types: No LeFort, zygomaticomaxillary complex or nasoorbitoethmoidal fracture. --Simple fracture types: None. --Mandible: No fracture or other acute abnormality. Orbits: The globes appear intact. Normal appearance of the intra- and extraconal fat. Symmetric extraocular muscles. Sinuses: No fluid levels or advanced mucosal thickening. Soft tissues: Soft tissue swelling at the lower lip. CT CERVICAL SPINE FINDINGS Alignment: No static subluxation. Facets are aligned. Occipital condyles are normally positioned. Skull base and vertebrae: No acute fracture. Soft tissues and spinal canal: No prevertebral fluid or swelling. No visible canal hematoma. Disc levels: Multilevel degenerative disc disease and facet hypertrophy without advanced canal or neural foraminal stenosis. Upper chest: No pneumothorax, pulmonary nodule or pleural effusion. Other: Normal visualized paraspinal cervical soft tissues. IMPRESSION: 1. Small acute subdural hematoma along the left leaflet of the cerebellar tentorium, measuring 2 mm in thickness. No associated mass effect or hydrocephalus. 2. Lower facial soft tissue swelling without acute facial fracture. 3. No acute fracture or static subluxation of the cervical spine. These results were called by telephone at the time of interpretation on 05/22/2016 at 10:48 pm to [facility] , who verbally acknowledged these results. Electronically Signed   By: Deatra Robinson M.D.   On: 05/22/2016 22:48   Ct Cervical Spine Wo Contrast  Result Date: 05/22/2016 CLINICAL DATA:  Fall stairs.  Anticoagulated patient. EXAM: CT HEAD WITHOUT CONTRAST CT MAXILLOFACIAL WITHOUT CONTRAST CT CERVICAL SPINE WITHOUT CONTRAST TECHNIQUE: Multidetector CT  imaging of the head, cervical spine, and maxillofacial structures were performed using the standard protocol without intravenous contrast. Multiplanar CT image reconstructions of the cervical spine and maxillofacial structures were also generated. COMPARISON:  None. FINDINGS: CT HEAD FINDINGS Brain: There is a small amount of subdural blood layering along the left tentorial leaflet. No CT evidence of acute cortical infarct. No intraparenchymal hemorrhage. No midline shift or mass effect. There is periventricular hypoattenuation compatible with chronic microvascular disease. Vascular: Atherosclerotic calcification of the vertebral and internal carotid arteries at the skullbase. CT MAXILLOFACIAL FINDINGS Osseous: --Complex facial fracture types: No LeFort, zygomaticomaxillary complex or nasoorbitoethmoidal fracture. --Simple fracture types: None. --Mandible: No fracture or other acute abnormality. Orbits: The globes appear intact. Normal appearance of the intra- and extraconal fat. Symmetric extraocular muscles. Sinuses: No fluid levels or advanced mucosal thickening. Soft tissues: Soft tissue swelling at the lower lip. CT CERVICAL SPINE FINDINGS Alignment: No static subluxation. Facets are aligned. Occipital condyles are normally positioned. Skull base and vertebrae: No acute fracture.  Soft tissues and spinal canal: No prevertebral fluid or swelling. No visible canal hematoma. Disc levels: Multilevel degenerative disc disease and facet hypertrophy without advanced canal or neural foraminal stenosis. Upper chest: No pneumothorax, pulmonary nodule or pleural effusion. Other: Normal visualized paraspinal cervical soft tissues. IMPRESSION: 1. Small acute subdural hematoma along the left leaflet of the cerebellar tentorium, measuring 2 mm in thickness. No associated mass effect or hydrocephalus. 2. Lower facial soft tissue swelling without acute facial fracture. 3. No acute fracture or static subluxation of the cervical  spine. These results were called by telephone at the time of interpretation on 05/22/2016 at 10:48 pm to [facility] [place] , who verbally acknowledged these results. Electronically Signed   By: Deatra Robinson M.D.   On: 05/22/2016 22:48   Dg Chest Port 1 View  Result Date: 05/24/2016 CLINICAL DATA:  Respiratory failure. EXAM: PORTABLE CHEST 1 VIEW COMPARISON:  01/21/2014 FINDINGS: The heart size and mediastinal contours are within normal limits. There is no evidence of pulmonary edema, consolidation, pneumothorax, nodule or pleural fluid. The visualized skeletal structures are unremarkable. IMPRESSION: No active disease. Electronically Signed   By: Irish Lack M.D.   On: 05/24/2016 07:46   Dg Knee Complete 4 Views Right  Result Date: 05/22/2016 CLINICAL DATA:  Patient fell with right knee pain anteriorly, swelling and laceration. EXAM: RIGHT KNEE - COMPLETE 4+ VIEW COMPARISON:  None. FINDINGS: There is femorotibial joint space narrowing along the lateral aspect with spurring. Spurring is also noted of the tibial spine. No acute fracture or bone destruction. No joint effusion. Popliteal arteriosclerosis of incidental note. No intra-articular loose bodies. IMPRESSION: Lateral femorotibial joint space narrowing. No acute osseous abnormality. Electronically Signed   By: Tollie Eth M.D.   On: 05/22/2016 22:45   Ct Maxillofacial Wo Contrast  Result Date: 05/22/2016 CLINICAL DATA:  Fall stairs.  Anticoagulated patient. EXAM: CT HEAD WITHOUT CONTRAST CT MAXILLOFACIAL WITHOUT CONTRAST CT CERVICAL SPINE WITHOUT CONTRAST TECHNIQUE: Multidetector CT imaging of the head, cervical spine, and maxillofacial structures were performed using the standard protocol without intravenous contrast. Multiplanar CT image reconstructions of the cervical spine and maxillofacial structures were also generated. COMPARISON:  None. FINDINGS: CT HEAD FINDINGS Brain: There is a small amount of subdural blood layering along  the left tentorial leaflet. No CT evidence of acute cortical infarct. No intraparenchymal hemorrhage. No midline shift or mass effect. There is periventricular hypoattenuation compatible with chronic microvascular disease. Vascular: Atherosclerotic calcification of the vertebral and internal carotid arteries at the skullbase. CT MAXILLOFACIAL FINDINGS Osseous: --Complex facial fracture types: No LeFort, zygomaticomaxillary complex or nasoorbitoethmoidal fracture. --Simple fracture types: None. --Mandible: No fracture or other acute abnormality. Orbits: The globes appear intact. Normal appearance of the intra- and extraconal fat. Symmetric extraocular muscles. Sinuses: No fluid levels or advanced mucosal thickening. Soft tissues: Soft tissue swelling at the lower lip. CT CERVICAL SPINE FINDINGS Alignment: No static subluxation. Facets are aligned. Occipital condyles are normally positioned. Skull base and vertebrae: No acute fracture. Soft tissues and spinal canal: No prevertebral fluid or swelling. No visible canal hematoma. Disc levels: Multilevel degenerative disc disease and facet hypertrophy without advanced canal or neural foraminal stenosis. Upper chest: No pneumothorax, pulmonary nodule or pleural effusion. Other: Normal visualized paraspinal cervical soft tissues. IMPRESSION: 1. Small acute subdural hematoma along the left leaflet of the cerebellar tentorium, measuring 2 mm in thickness. No associated mass effect or hydrocephalus. 2. Lower facial soft tissue swelling without acute facial fracture. 3. No acute fracture or static  subluxation of the cervical spine. These results were called by telephone at the time of interpretation on 05/22/2016 at 10:48 pm to [facility] [place] , who verbally acknowledged these results. Electronically Signed   By: Deatra Robinson M.D.   On: 05/22/2016 22:48        Scheduled Meds: . insulin aspart  0-15 Units Subcutaneous Q4H   Continuous Infusions: . sodium  chloride 75 mL/hr at 05/24/16 1007     LOS: 1 day    Makilah Dowda Jaynie Collins, MD [facility] Pager [phone]  If 7PM-7AM, please contact night-coverage [email or web address] Password [email or web address] 05/24/2016, 1:34 PM

## 2016-05-24 NOTE — Progress Notes (Signed)
Nutrition Brief Note  Patient identified on the Malnutrition Screening Tool (MST) Report.  Wt Readings from Last 15 Encounters:  05/24/16 198 lb 3.1 oz (89.9 kg)  03/09/16 210 lb (95.3 kg)  03/02/16 210 lb (95.3 kg)  02/25/15 222 lb 9.6 oz (101 kg)  01/21/14 214 lb (97.1 kg)  01/07/14 216 lb 3.2 oz (98.1 kg)  09/03/13 216 lb (98 kg)  02/19/13 212 lb 9.6 oz (96.4 kg)  02/21/12 220 lb 3.2 oz (99.9 kg)  05/17/11 225 lb (102.1 kg)  04/25/11 225 lb 9.6 oz (102.3 kg)  02/15/11 220 lb (99.8 kg)  11/01/09 211 lb 5 oz (95.9 kg)    Body mass index is 26.88 kg/m. Patient meets criteria for Overweight based on current BMI.   Current diet order is Heart Healthy/Carbohydrate Modified.  Patient reports a good appetite.  Labs and medications reviewed.   No nutrition interventions warranted at this time. If nutrition issues arise, please consult RD.   Arthur Holms, RD, LDN Pager #: [phone] After-Hours Pager #: [phone]

## 2016-05-24 NOTE — Evaluation (Signed)
Physical Therapy Evaluation Patient Details Name: Alejandro Russell MRN: [id number] DOB: Oct 14, 1927 Today's Date: 05/24/2016   History of Present Illness  Patient is a 82 y/o male with hx of DM, HTN, HLD, DVT, CAD, chronic renal disease presents s/p fall down the stairs. Found to have a lip laceration, dental fracture and left small tentorial subdural hematoma,  Clinical Impression  Patient presents with generalized weakness and impaired balance/mobility s/p above. Tolerated gait training with Min guard assist for safety. Pt is active and independent PTA. Anticipate with increased activity/mobility, pt will improve strength. Concerned about tooth and gums. RN notified. Will follow acutely to maximize independence and mobility prior to return home.     Follow Up Recommendations No PT follow up;Supervision for mobility/OOB;Supervision/Assistance - 24 hour    Equipment Recommendations  None recommended by PT    Recommendations for Other Services       Precautions / Restrictions Precautions Precautions: Fall Restrictions Weight Bearing Restrictions: No      Mobility  Bed Mobility Overal bed mobility: Needs Assistance Bed Mobility: Supine to Sit     Supine to sit: Supervision;HOB elevated     General bed mobility comments: Increased time but no assist needed.  Transfers Overall transfer level: Needs assistance Equipment used: None Transfers: Sit to/from Stand Sit to Stand: Min guard         General transfer comment: Min guard for safety.   Ambulation/Gait Ambulation/Gait assistance: Min guard Ambulation Distance (Feet): 120 Feet Assistive device: None Gait Pattern/deviations: Step-through pattern;Decreased stride length Gait velocity: decreased Gait velocity interpretation: <1.8 ft/sec, indicative of risk for recurrent falls General Gait Details: Slow, guarded gait. Min guard for safety due to first time being up. No LOB.  Stairs            Wheelchair  Mobility    Modified Rankin (Stroke Patients Only) Modified Rankin (Stroke Patients Only) Pre-Morbid Rankin Score: No significant disability Modified Rankin: Moderately severe disability     Balance Overall balance assessment: Needs assistance Sitting-balance support: Feet supported;No upper extremity supported Sitting balance-Leahy Scale: Good Sitting balance - Comments: Able to donn socks sitting EOB without difficulty or LOB.   Standing balance support: During functional activity Standing balance-Leahy Scale: Fair                               Pertinent Vitals/Pain Pain Assessment: No/denies pain    Home Living Family/patient expects to be discharged to:: Private residence Living Arrangements: Spouse/significant other;Children Available Help at Discharge: Family Type of Home: House Home Access: Level entry;Stairs to enter   Entrance Stairs-Number of Steps: 3 or none Home Layout: One level;Laundry or work area in [employer] Equipment: IT sales professional - standard      Prior Function Level of Independence: Independent         Comments: Drives, cooks. Active.      Hand Dominance   Dominant Hand: Right    Extremity/Trunk Assessment   Upper Extremity Assessment Upper Extremity Assessment: Defer to OT evaluation    Lower Extremity Assessment Lower Extremity Assessment: Generalized weakness       Communication   Communication: No difficulties  Cognition Arousal/Alertness: Awake/alert Behavior During Therapy: WFL for tasks assessed/performed Overall Cognitive Status: Within Functional Limits for tasks assessed                      General Comments General comments (skin integrity, edema, etc.): Wife  present during session.    Exercises     Assessment/Plan    PT Assessment Patient needs continued PT services  PT Problem List Decreased strength;Decreased mobility;Decreased balance          PT Treatment Interventions Gait  training;Therapeutic activities;Therapeutic exercise;Patient/family education;Balance training;Functional mobility training;Stair training    PT Goals (Current goals can be found in the Care Plan section)  Acute Rehab PT Goals Patient Stated Goal: to go home ASAP PT Goal Formulation: With patient/family Time For Goal Achievement: 06/07/16 Potential to Achieve Goals: Good    Frequency Min 3X/week   Barriers to discharge        Co-evaluation               End of Session Equipment Utilized During Treatment: Gait belt Activity Tolerance: Patient tolerated treatment well Patient left: in chair;with call bell/phone within reach;with family/visitor present;with nursing/sitter in room Nurse Communication: Mobility status         Time: 4034-7425 PT Time Calculation (min) (ACUTE ONLY): 23 min   Charges:   PT Evaluation $PT Eval Low Complexity: 1 Procedure PT Treatments $Gait Training: 8-22 mins   PT G Codes:        Darral Rishel A Antwone Capozzoli 05/24/2016, 10:15 AM Mylo Red, PT, DPT [phone]

## 2016-05-24 NOTE — Progress Notes (Signed)
Subjective: Patient resting in bed, denies headache. CT of brain today shows significant clearing of small left tentorial subdural hematoma.  Objective: Vital signs in last 24 hours: Vitals:   05/24/16 0405 05/24/16 0500 05/24/16 0700 05/24/16 1206  BP: 139/62  (!) 135/55 130/69  Pulse: 74  75 78  Resp: 15  19 14   Temp: 98.1 F (36.7 C)  98.8 F (37.1 C) 97.6 F (36.4 C)  TempSrc: Oral  Oral Oral  SpO2: 98%  99% 97%  Weight:  89.9 kg (198 lb 3.1 oz)      Intake/Output from previous day: 12/26 0701 - 12/27 0700 In: [id number] [I.V.:1725; IV Piggyback:50] Out: 2125 [Urine:2125] Intake/Output this shift: Total I/O In: -  Out: 150 [Urine:150]  Physical Exam:  Awake and alert, oriented to name, [facility] hospital, and 2017. Following commands. Speech fluent. EOMI. Facial movements symmetrical. Moving all 4 extremities well. No drift of upper extremities.  CBC  Recent Labs  05/23/16 0437 05/24/16 0446  WBC 8.8 7.8  HGB 12.5* 12.5*  HCT 37.5* 38.3*  PLT 236 232   BMET  Recent Labs  05/23/16 0437 05/24/16 0446  NA 138 138  K 4.2 4.1  CL 103 107  CO2 26 27  GLUCOSE 99 98  BUN 22* 16  CREATININE 1.21 1.03  CALCIUM 10.7* 10.1    Studies/Results: Ct Head Wo Contrast  Result Date: 05/24/2016 CLINICAL DATA:  On Coumadin, recent fall EXAM: CT HEAD WITHOUT CONTRAST TECHNIQUE: Contiguous axial images were obtained from the base of the skull through the vertex without intravenous contrast. COMPARISON:  CT brain scan of 05/22/2016 FINDINGS: Brain: The small amount of subdural blood previously noted along the left tentorium has slightly diminished in overall volume. No new subdural or subarachnoid collection of blood is seen. Diffuse changes of atrophy noted and the ventricles are somewhat prominent. Mild small vessel ischemic change again is noted throughout the periventricular white matter. No acute changes evident. No mass-effect is seen. Vascular: No vascular abnormality is  seen on this unenhanced study. Skull: No acute calvarial abnormality is noted. Sinuses/Orbits: The paranasal sinuses are well pneumatized. Other: None IMPRESSION: Interval decrease in small left tentorial subdural hematoma. No other abnormality. Diffuse atrophy and small vessel disease again noted. Electronically Signed   By: Ivar Drape M.D.   On: 05/24/2016 09:38   Ct Head Wo Contrast  Result Date: 05/22/2016 CLINICAL DATA:  Fall stairs.  Anticoagulated patient. EXAM: CT HEAD WITHOUT CONTRAST CT MAXILLOFACIAL WITHOUT CONTRAST CT CERVICAL SPINE WITHOUT CONTRAST TECHNIQUE: Multidetector CT imaging of the head, cervical spine, and maxillofacial structures were performed using the standard protocol without intravenous contrast. Multiplanar CT image reconstructions of the cervical spine and maxillofacial structures were also generated. COMPARISON:  None. FINDINGS: CT HEAD FINDINGS Brain: There is a small amount of subdural blood layering along the left tentorial leaflet. No CT evidence of acute cortical infarct. No intraparenchymal hemorrhage. No midline shift or mass effect. There is periventricular hypoattenuation compatible with chronic microvascular disease. Vascular: Atherosclerotic calcification of the vertebral and internal carotid arteries at the skullbase. CT MAXILLOFACIAL FINDINGS Osseous: --Complex facial fracture types: No LeFort, zygomaticomaxillary complex or nasoorbitoethmoidal fracture. --Simple fracture types: None. --Mandible: No fracture or other acute abnormality. Orbits: The globes appear intact. Normal appearance of the intra- and extraconal fat. Symmetric extraocular muscles. Sinuses: No fluid levels or advanced mucosal thickening. Soft tissues: Soft tissue swelling at the lower lip. CT CERVICAL SPINE FINDINGS Alignment: No static subluxation. Facets are aligned. Occipital condyles  are normally positioned. Skull base and vertebrae: No acute fracture. Soft tissues and spinal canal: No  prevertebral fluid or swelling. No visible canal hematoma. Disc levels: Multilevel degenerative disc disease and facet hypertrophy without advanced canal or neural foraminal stenosis. Upper chest: No pneumothorax, pulmonary nodule or pleural effusion. Other: Normal visualized paraspinal cervical soft tissues. IMPRESSION: 1. Small acute subdural hematoma along the left leaflet of the cerebellar tentorium, measuring 2 mm in thickness. No associated mass effect or hydrocephalus. 2. Lower facial soft tissue swelling without acute facial fracture. 3. No acute fracture or static subluxation of the cervical spine. These results were called by telephone at the time of interpretation on 05/22/2016 at 10:48 pm to [facility] , who verbally acknowledged these results. Electronically Signed   By: Ulyses Jarred M.D.   On: 05/22/2016 22:48   Ct Cervical Spine Wo Contrast  Result Date: 05/22/2016 CLINICAL DATA:  Fall stairs.  Anticoagulated patient. EXAM: CT HEAD WITHOUT CONTRAST CT MAXILLOFACIAL WITHOUT CONTRAST CT CERVICAL SPINE WITHOUT CONTRAST TECHNIQUE: Multidetector CT imaging of the head, cervical spine, and maxillofacial structures were performed using the standard protocol without intravenous contrast. Multiplanar CT image reconstructions of the cervical spine and maxillofacial structures were also generated. COMPARISON:  None. FINDINGS: CT HEAD FINDINGS Brain: There is a small amount of subdural blood layering along the left tentorial leaflet. No CT evidence of acute cortical infarct. No intraparenchymal hemorrhage. No midline shift or mass effect. There is periventricular hypoattenuation compatible with chronic microvascular disease. Vascular: Atherosclerotic calcification of the vertebral and internal carotid arteries at the skullbase. CT MAXILLOFACIAL FINDINGS Osseous: --Complex facial fracture types: No LeFort, zygomaticomaxillary complex or nasoorbitoethmoidal fracture. --Simple fracture types:  None. --Mandible: No fracture or other acute abnormality. Orbits: The globes appear intact. Normal appearance of the intra- and extraconal fat. Symmetric extraocular muscles. Sinuses: No fluid levels or advanced mucosal thickening. Soft tissues: Soft tissue swelling at the lower lip. CT CERVICAL SPINE FINDINGS Alignment: No static subluxation. Facets are aligned. Occipital condyles are normally positioned. Skull base and vertebrae: No acute fracture. Soft tissues and spinal canal: No prevertebral fluid or swelling. No visible canal hematoma. Disc levels: Multilevel degenerative disc disease and facet hypertrophy without advanced canal or neural foraminal stenosis. Upper chest: No pneumothorax, pulmonary nodule or pleural effusion. Other: Normal visualized paraspinal cervical soft tissues. IMPRESSION: 1. Small acute subdural hematoma along the left leaflet of the cerebellar tentorium, measuring 2 mm in thickness. No associated mass effect or hydrocephalus. 2. Lower facial soft tissue swelling without acute facial fracture. 3. No acute fracture or static subluxation of the cervical spine. These results were called by telephone at the time of interpretation on 05/22/2016 at 10:48 pm to [facility] , who verbally acknowledged these results. Electronically Signed   By: Ulyses Jarred M.D.   On: 05/22/2016 22:48   Dg Chest Port 1 View  Result Date: 05/24/2016 CLINICAL DATA:  Respiratory failure. EXAM: PORTABLE CHEST 1 VIEW COMPARISON:  01/21/2014 FINDINGS: The heart size and mediastinal contours are within normal limits. There is no evidence of pulmonary edema, consolidation, pneumothorax, nodule or pleural fluid. The visualized skeletal structures are unremarkable. IMPRESSION: No active disease. Electronically Signed   By: Aletta Edouard M.D.   On: 05/24/2016 07:46   Dg Knee Complete 4 Views Right  Result Date: 05/22/2016 CLINICAL DATA:  Patient fell with right knee pain anteriorly, swelling and  laceration. EXAM: RIGHT KNEE - COMPLETE 4+ VIEW COMPARISON:  None. FINDINGS: There is femorotibial joint space  narrowing along the lateral aspect with spurring. Spurring is also noted of the tibial spine. No acute fracture or bone destruction. No joint effusion. Popliteal arteriosclerosis of incidental note. No intra-articular loose bodies. IMPRESSION: Lateral femorotibial joint space narrowing. No acute osseous abnormality. Electronically Signed   By: Ashley Royalty M.D.   On: 05/22/2016 22:45   Ct Maxillofacial Wo Contrast  Result Date: 05/22/2016 CLINICAL DATA:  Fall stairs.  Anticoagulated patient. EXAM: CT HEAD WITHOUT CONTRAST CT MAXILLOFACIAL WITHOUT CONTRAST CT CERVICAL SPINE WITHOUT CONTRAST TECHNIQUE: Multidetector CT imaging of the head, cervical spine, and maxillofacial structures were performed using the standard protocol without intravenous contrast. Multiplanar CT image reconstructions of the cervical spine and maxillofacial structures were also generated. COMPARISON:  None. FINDINGS: CT HEAD FINDINGS Brain: There is a small amount of subdural blood layering along the left tentorial leaflet. No CT evidence of acute cortical infarct. No intraparenchymal hemorrhage. No midline shift or mass effect. There is periventricular hypoattenuation compatible with chronic microvascular disease. Vascular: Atherosclerotic calcification of the vertebral and internal carotid arteries at the skullbase. CT MAXILLOFACIAL FINDINGS Osseous: --Complex facial fracture types: No LeFort, zygomaticomaxillary complex or nasoorbitoethmoidal fracture. --Simple fracture types: None. --Mandible: No fracture or other acute abnormality. Orbits: The globes appear intact. Normal appearance of the intra- and extraconal fat. Symmetric extraocular muscles. Sinuses: No fluid levels or advanced mucosal thickening. Soft tissues: Soft tissue swelling at the lower lip. CT CERVICAL SPINE FINDINGS Alignment: No static subluxation. Facets are  aligned. Occipital condyles are normally positioned. Skull base and vertebrae: No acute fracture. Soft tissues and spinal canal: No prevertebral fluid or swelling. No visible canal hematoma. Disc levels: Multilevel degenerative disc disease and facet hypertrophy without advanced canal or neural foraminal stenosis. Upper chest: No pneumothorax, pulmonary nodule or pleural effusion. Other: Normal visualized paraspinal cervical soft tissues. IMPRESSION: 1. Small acute subdural hematoma along the left leaflet of the cerebellar tentorium, measuring 2 mm in thickness. No associated mass effect or hydrocephalus. 2. Lower facial soft tissue swelling without acute facial fracture. 3. No acute fracture or static subluxation of the cervical spine. These results were called by telephone at the time of interpretation on 05/22/2016 at 10:48 pm to [facility] , who verbally acknowledged these results. Electronically Signed   By: Ulyses Jarred M.D.   On: 05/22/2016 22:48    Assessment/Plan: Doing well from a neurosurgical perspective, and can be discharged home from a neurosurgical perspective.  He will need to follow-up with me in the office in about 2 weeks or so. He will need a CT the brain without contrast the day of his office visit (he should ask my office staff to make arrangements for the CT). Patient needs to remain off of all anticoagulants and antiplatelet agents.   Hosie Spangle, MD 05/24/2016, 4:52 PM

## 2016-05-24 NOTE — Progress Notes (Signed)
PT Cancellation Note  Patient Details Name: Alejandro Russell MRN: [id number] DOB: [birth year]   Cancelled Treatment:    Reason Eval/Treat Not Completed: Patient at procedure or test/unavailable Off floor for a test. Will follow up as time allows.   [name] 05/24/2016, 8:54 AM Wray Kearns, PT, DPT [phone]

## 2016-05-24 NOTE — Progress Notes (Signed)
Pt has refused cpap at this time.  Rt will monitor. 

## 2016-05-25 DIAGNOSIS — Z86718 Personal history of other venous thrombosis and embolism: Secondary | ICD-10-CM

## 2016-05-25 LAB — GLUCOSE, CAPILLARY
GLUCOSE-CAPILLARY: 100 mg/dL — AB (ref 65–99)
Glucose-Capillary: 98 mg/dL (ref 65–99)

## 2016-05-25 NOTE — Discharge Summary (Signed)
Physician Discharge Summary  Alejandro Russell [id number] DOB: Nov 10, 1927 DOA: 05/22/2016  PCP: Alejandro Redwood, MD  Admit date: 05/22/2016 Discharge date: 05/25/2016  Admitted From:Home Disposition: Home    Recommendations for Outpatient Follow-up:  1. Follow up with PCP in 1-2 weeks 2. Please check CT scan of head in 2 weeks and follow-up with neurosurgeon.  Home Health: No Equipment/Devices: No Discharge Condition: Stable CODE STATUS: Full code Diet recommendation: Heart healthy  Brief/Interim Summary:82 y.o.malewith PMH as outlined below including hx DVT in 2003 (has been on coumadin ever since). He [facility] ED 12/25 after suffering a mechanical fall while in [place]. He and his wife drove back to [place] and since he had persistent pain and bleeding from lip laceration, they decided to come to ED for further evaluation and management.In ED, he had CT of the brain which demonstrated some dental fractures, lip laceration, and most significantly a small left tentorial subdural hematoma.INR was noted to be 3 (chronic coumadin). Neurosurgery was consulted who recommended reversing INR and admitted to ICU under PCCM.Pt and wife report he has not had any problems with recurrent DVT or other VTE and that nobody told them to ever stop the coumadin; hence why he has been on it for 14 years.  #Acute subdural hematoma after a mechanical fall while on chronic anticoagulation: -Evaluated by neurosurgery. Repeat CT scan of head showed improvement in subdural hematoma. Advised to discontinue Coumadin and her any other blood thinner. They verbalized understanding. Also patient was educated to follow up with neurosurgeon in 2 weeks and repeat CT scan of head. Patient's wife is already calling clinic for follow-up appointment. Patient is able to ambulate. Denied headache, dizziness, lightheadedness, nausea or vomiting.  #History of obstructive sleep apnea on CPAP: Continue CPAP and  outpatient follow-up.  #History of DVT in 2003 has been on Coumadin since then. No problem with recurrent DVT. Repeat ultrasound of lower extremities with no DVT. Plan to discontinue Coumadin this time.  #History of hypertension, hyperlipidemia, coronary artery disease: Continue to monitor closely.  -Advised to resume home medication. Also advised to follow-up with PCP.  Patient is clinically improved. Verbalized understanding of follow-up instruction. Discussed with the patient, his wife and daughter at bedside. Medically stable to discharge home with outpatient follow-up.   Discharge Diagnoses:  Active Problems:   SDH (subdural hematoma) (HCC)   Supratherapeutic INR   Anticoagulated on Coumadin   Subdural hematoma (HCC)   Hyperglycemia    Discharge Instructions  Discharge Instructions    Call MD for:  difficulty breathing, headache or visual disturbances    Complete by:  As directed    Call MD for:  hives    Complete by:  As directed    Call MD for:  persistant dizziness or light-headedness    Complete by:  As directed    Call MD for:  persistant nausea and vomiting    Complete by:  As directed    Call MD for:  severe uncontrolled pain    Complete by:  As directed    Call MD for:  temperature >100.4    Complete by:  As directed    Diet - low sodium heart healthy    Complete by:  As directed    Discharge instructions    Complete by:  As directed    Please avoid any blood thinners. Please start Coumadin.   Increase activity slowly    Complete by:  As directed      Allergies  as of 05/25/2016   No Known Allergies     Medication List    STOP taking these medications   warfarin 3 MG tablet Commonly known as:  COUMADIN     TAKE these medications   buPROPion 300 MG 24 hr tablet Commonly known as:  WELLBUTRIN XL Take 300 mg by mouth every morning.   cyanocobalamin 1000 MCG/ML injection Commonly known as:  (VITAMIN B-12) Inject 1,000 mcg into the muscle every 30  (thirty) days.   DENTA 5000 PLUS 1.1 % Crea dental cream Generic drug:  sodium fluoride Place 1 application onto teeth at bedtime.   ergocalciferol 50000 units capsule Commonly known as:  VITAMIN D2 Take 50,000 Units by mouth once a week.   Vitamin D (Ergocalciferol) 50000 units Caps capsule Commonly known as:  DRISDOL Take 50,000 Units by mouth every Sunday.   fenofibrate 145 MG tablet Commonly known as:  TRICOR Take 145 mg by mouth every evening.   FISH OIL PO Take 1 capsule by mouth daily.   iron polysaccharides 150 MG capsule Commonly known as:  NIFEREX Take 150 mg by mouth daily.   isosorbide mononitrate 60 MG 24 hr tablet Commonly known as:  IMDUR Take 60 mg by mouth every morning.   metFORMIN 500 MG tablet Commonly known as:  GLUCOPHAGE Take 500 mg by mouth 2 (two) times daily.   metoprolol 100 MG tablet Commonly known as:  LOPRESSOR Take 50 mg by mouth 2 (two) times daily.   niacin 1000 MG CR tablet Commonly known as:  NIASPAN Take 1,000 mg by mouth at bedtime.   PARoxetine 20 MG tablet Commonly known as:  PAXIL Take 20 mg by mouth at bedtime.   ramipril 2.5 MG capsule Commonly known as:  ALTACE Take 2.5 mg by mouth every evening.   rosuvastatin 40 MG tablet Commonly known as:  CRESTOR Take 40 mg by mouth daily.   SUPER B COMPLEX PO Take 1 capsule by mouth daily.   Turmeric Curcumin Caps Take 2 capsules by mouth daily.   VITAMIN D3 PO Take 1 tablet by mouth daily.      Follow-up Information    Alejandro Spangle, MD. Schedule an appointment as soon as possible for a visit in 2 week(s).   Specialty:  Neurosurgery Why:  please call to make appointment for CT scan of head.  Contact information: [address] [place] [address] [phone]        Alejandro Redwood, MD. Schedule an appointment as soon as possible for a visit in 1 week(s).   Specialty:  Internal Medicine Contact information: [address] [place]  [place] [address] [phone]          No Known Allergies  Consultations: Neurosurgery Admitted by critical care  Procedures/Studies: CT scan head  Subjective: Patient was seen and examined at bedside. Reported feeling much better. Denied headache, dizziness, nausea, vomiting, chest pain, shortness of breath. Able to walk without problem. Wife and daughter at bedside.   Discharge Exam: Vitals:   05/25/16 0407 05/25/16 0700  BP: (!) 135/57 (!) 152/60  Pulse: (!) 59 67  Resp: 14 18  Temp: 98.5 F (36.9 C) 97.8 F (36.6 C)   Vitals:   05/24/16 2004 05/24/16 2333 05/25/16 0407 05/25/16 0700  BP: (!) 150/58 137/66 (!) 135/57 (!) 152/60  Pulse: 68 (!) 52 (!) 59 67  Resp: 16 19 14 18   Temp: 98.5 F (36.9 C) 98.2 F (36.8 C) 98.5 F (36.9 C) 97.8 F (  36.6 C)  TempSrc: Oral Oral Oral Oral  SpO2: 94% 96% 94% 97%  Weight:   90 kg (198 lb 6.6 oz)     General: Pt is alert, awake, not in acute distress. Lower lip with old dry blood. No swelling or any discharge Cardiovascular: RRR, S1/S2 +, no rubs, no gallops Respiratory: CTA bilaterally, no wheezing, no rhonchi Abdominal: Soft, NT, ND, bowel sounds + Extremities: no edema, no cyanosis Neurology: Alert, awake, oriented 3, muscular strength 5 over 5 in all extremities symmetric   The results of significant diagnostics from this hospitalization (including imaging, microbiology, ancillary and laboratory) are listed below for reference.     Microbiology: Recent Results (from the past 240 hour(s))  MRSA PCR Screening     Status: None   Collection Time: 05/23/16  4:19 AM  Result Value Ref Range Status   MRSA by PCR NEGATIVE NEGATIVE Final    Comment:        The GeneXpert MRSA Assay (FDA approved for NASAL specimens only), is one component of a comprehensive MRSA colonization surveillance program. It is not intended to diagnose MRSA infection nor to guide or monitor treatment for MRSA infections.      Labs: BNP (last  3 results) No results for input(s): BNP in the last 8760 hours. Basic Metabolic Panel:  Recent Labs Lab 05/22/16 2203 05/23/16 0007 05/23/16 0437 05/24/16 0446  NA 140 138 138 138  K 4.3 4.0 4.2 4.1  CL 100* 104 103 107  CO2  --  26 26 27   GLUCOSE 105* 96 99 98  BUN 27* 23* 22* 16  CREATININE 1.20 1.24 1.21 1.03  CALCIUM  --  10.7* 10.7* 10.1  MG  --   --  1.6* 1.9  PHOS  --   --  2.3* 2.7   Liver Function Tests: No results for input(s): AST, ALT, ALKPHOS, BILITOT, PROT, ALBUMIN in the last 168 hours. No results for input(s): LIPASE, AMYLASE in the last 168 hours. No results for input(s): AMMONIA in the last 168 hours. CBC:  Recent Labs Lab 05/22/16 2203 05/23/16 0007 05/23/16 0437 05/24/16 0446  WBC  --  10.3 8.8 7.8  HGB 13.3 12.8* 12.5* 12.5*  HCT 39.0 38.1* 37.5* 38.3*  MCV  --  92.7 92.8 91.8  PLT  --  247 236 232   Cardiac Enzymes: No results for input(s): CKTOTAL, CKMB, CKMBINDEX, TROPONINI in the last 168 hours. BNP: Invalid input(s): POCBNP CBG:  Recent Labs Lab 05/24/16 1719 05/24/16 2003 05/24/16 2333 05/25/16 0406 05/25/16 0757  GLUCAP 130* 176* 110* 98 100*   D-Dimer No results for input(s): DDIMER in the last 72 hours. Hgb A1c  Recent Labs  05/23/16 0437  HGBA1C 5.9*   Lipid Profile No results for input(s): CHOL, HDL, LDLCALC, TRIG, CHOLHDL, LDLDIRECT in the last 72 hours. Thyroid function studies No results for input(s): TSH, T4TOTAL, T3FREE, THYROIDAB in the last 72 hours.  Invalid input(s): FREET3 Anemia work up No results for input(s): VITAMINB12, FOLATE, FERRITIN, TIBC, IRON, RETICCTPCT in the last 72 hours. Urinalysis    Component Value Date/Time   COLORURINE YELLOW 10/08/2009 2132   APPEARANCEUR HAZY (A) 10/08/2009 2132   LABSPEC 1.007 10/08/2009 2132   PHURINE 6.0 10/08/2009 2132   GLUCOSEU NEGATIVE 10/08/2009 2132   HGBUR NEGATIVE 10/08/2009 2132   BILIRUBINUR NEGATIVE 10/08/2009 2132   KETONESUR NEGATIVE  10/08/2009 2132   PROTEINUR NEGATIVE 10/08/2009 2132   UROBILINOGEN 1.0 10/08/2009 2132   NITRITE NEGATIVE 10/08/2009 2132  LEUKOCYTESUR  10/08/2009 2132    NEGATIVE MICROSCOPIC NOT DONE ON URINES WITH NEGATIVE PROTEIN, BLOOD, LEUKOCYTES, NITRITE, OR GLUCOSE <1000 mg/dL.   Sepsis Labs Invalid input(s): PROCALCITONIN,  WBC,  LACTICIDVEN Microbiology Recent Results (from the past 240 hour(s))  MRSA PCR Screening     Status: None   Collection Time: 05/23/16  4:19 AM  Result Value Ref Range Status   MRSA by PCR NEGATIVE NEGATIVE Final    Comment:        The GeneXpert MRSA Assay (FDA approved for NASAL specimens only), is one component of a comprehensive MRSA colonization surveillance program. It is not intended to diagnose MRSA infection nor to guide or monitor treatment for MRSA infections.      Time coordinating discharge: 26 minutes  SIGNED:   Rosita Fire, MD  [facility] 05/25/2016, 10:54 AM  If 7PM-7AM, please contact night-coverage [email or web address] Password [email or web address]

## 2016-05-25 NOTE — Plan of Care (Signed)
Problem: Physical Regulation: Goal: Will regain or maintain usual level of consciousness Outcome: Completed/Met Date Met: 05/25/16 Patient at baseline mental status.   Problem: Tissue Perfusion: Goal: Ability to maintain intracranial pressure will improve Outcome: Not Applicable Date Met: 94/76/54 Not being measured on this unit.  Goal: Postoperative complications will be avoided or minimized Outcome: Not Applicable Date Met: 65/03/54 Patient has not had surgery.

## 2016-05-25 NOTE — Progress Notes (Signed)
Patient being discharged to home with spouse. PIV x2 removed. CCMD and Elink notified of discharge. Discharge instructions reviewed with patient and patient's spouse. Handout on SDH given and reviewed. No new medications ordered. Instructed patient to STOP taking coumadin per MD orders. All belongings returned. Answered all questions. Patient's wife to provide transportation home.   Joellen Jersey, RN.

## 2016-05-25 NOTE — Progress Notes (Signed)
Physical Therapy Treatment Patient Details Name: Alejandro Russell MRN: [id number] DOB: 05-28-1928 Today's Date: 05/25/2016    History of Present Illness Patient is a 82 y/o male with hx of DM, HTN, HLD, DVT, CAD, chronic renal disease presents s/p fall down the stairs. Found to have a lip laceration, dental fracture and left small tentorial subdural hematoma,    PT Comments    Patient progressing well towards PT goals. More steady on feet today but still demonstrates some imbalance with head turns. No overt LOB. Education on mobility progression and safety at home. Pt will have support from wife at d/c. Will continue to follow.   Follow Up Recommendations  No PT follow up;Supervision for mobility/OOB;Supervision/Assistance - 24 hour     Equipment Recommendations  None recommended by PT    Recommendations for Other Services       Precautions / Restrictions Precautions Precautions: Fall Restrictions Weight Bearing Restrictions: No    Mobility  Bed Mobility Overal bed mobility: Needs Assistance Bed Mobility: Supine to Sit     Supine to sit: Modified independent (Device/Increase time);HOB elevated     General bed mobility comments: Increased time but no assist needed.  Transfers Overall transfer level: Needs assistance Equipment used: None Transfers: Sit to/from Stand Sit to Stand: Min guard         General transfer comment: Min guard for safety.   Ambulation/Gait Ambulation/Gait assistance: Min guard Ambulation Distance (Feet): 400 Feet Assistive device: None Gait Pattern/deviations: Step-through pattern;Decreased stride length;Drifts right/left Gait velocity: decreased   General Gait Details: Slow, tentative gait with mild drifting noted esp with head turns but no overt LOB.    Stairs            Wheelchair Mobility    Modified Rankin (Stroke Patients Only) Modified Rankin (Stroke Patients Only) Pre-Morbid Rankin Score: No significant  disability Modified Rankin: Moderately severe disability     Balance Overall balance assessment: Needs assistance Sitting-balance support: Feet supported;No upper extremity supported Sitting balance-Leahy Scale: Good     Standing balance support: During functional activity Standing balance-Leahy Scale: Fair                      Cognition Arousal/Alertness: Awake/alert Behavior During Therapy: WFL for tasks assessed/performed Overall Cognitive Status: Within Functional Limits for tasks assessed                      Exercises      General Comments        Pertinent Vitals/Pain Pain Assessment: No/denies pain    Home Living                      Prior Function            PT Goals (current goals can now be found in the care plan section) Progress towards PT goals: Progressing toward goals    Frequency    Min 3X/week      PT Plan Current plan remains appropriate    Co-evaluation             End of Session Equipment Utilized During Treatment: Gait belt Activity Tolerance: Patient tolerated treatment well Patient left: in chair;with call bell/phone within reach     Time: 1117-1131 PT Time Calculation (min) (ACUTE ONLY): 14 min  Charges:  $Gait Training: 8-22 mins                    G  Codes:      Lacie Draft 05/25/2016, 11:36 AM Wray Kearns, PT, DPT [phone]

## 2016-05-26 ENCOUNTER — Other Ambulatory Visit: Payer: Self-pay | Admitting: Neurosurgery

## 2016-05-26 DIAGNOSIS — S065XAA Traumatic subdural hemorrhage with loss of consciousness status unknown, initial encounter: Secondary | ICD-10-CM

## 2016-05-26 DIAGNOSIS — S065X9A Traumatic subdural hemorrhage with loss of consciousness of unspecified duration, initial encounter: Secondary | ICD-10-CM

## 2016-05-31 DIAGNOSIS — E538 Deficiency of other specified B group vitamins: Secondary | ICD-10-CM | POA: Diagnosis not present

## 2016-06-01 ENCOUNTER — Ambulatory Visit (INDEPENDENT_AMBULATORY_CARE_PROVIDER_SITE_OTHER): Payer: Medicare Other | Admitting: Podiatry

## 2016-06-01 ENCOUNTER — Encounter: Payer: Self-pay | Admitting: Podiatry

## 2016-06-01 VITALS — Ht 72.0 in | Wt 210.0 lb

## 2016-06-01 DIAGNOSIS — B351 Tinea unguium: Secondary | ICD-10-CM

## 2016-06-01 DIAGNOSIS — E119 Type 2 diabetes mellitus without complications: Secondary | ICD-10-CM

## 2016-06-01 NOTE — Progress Notes (Signed)
Patient ID: Alejandro Russell, male   DOB: 01/11/1928, 83 y.o.   MRN: [id number] Complaint:  Visit Type: Patient returns to my office for continued preventative foot care services. Complaint: Patient states" my nails have grown long and thick and become painful to walk and wear shoes" Patient has been diagnosed with pre Diabetes.. He presents for preventative foot care services. No changes to ROS  Podiatric Exam: Vascular: dorsalis pedis and posterior tibial pulses are palpable bilateral. Capillary return is immediate. Temperature gradient is WNL. Skin turgor WNL  Sensorium: Normal Semmes Weinstein monofilament test. Normal tactile sensation bilaterally. Nail Exam: Pt has thick disfigured discolored nails with subungual debris noted bilateral entire nail hallux through fifth toenails Ulcer Exam: There is no evidence of ulcer or pre-ulcerative changes or infection. Orthopedic Exam: Muscle tone and strength are WNL. No limitations in general ROM. No crepitus or effusions noted. Foot type and digits show no abnormalities. Bony prominences are unremarkable. Skin: No Porokeratosis. No infection or ulcers  Diagnosis:  Tinea unguium, Pain in right toe, pain in left toes  Treatment & Plan Procedures and Treatment: Consent by patient was obtained for treatment procedures. The patient understood the discussion of treatment and procedures well. All questions were answered thoroughly reviewed. Debridement of mycotic and hypertrophic toenails, 1 through 5 bilateral and clearing of subungual debris. No ulceration, no infection noted.  Return Visit-Office Procedure: Patient instructed to return to the office for a follow up visit 3 months for continued evaluation and treatment.   Honestee Revard DPM 

## 2016-06-09 ENCOUNTER — Ambulatory Visit
Admission: RE | Admit: 2016-06-09 | Discharge: 2016-06-09 | Disposition: A | Discharge status: Home / Self Care | Payer: Medicare Other | Source: Ambulatory Visit | Attending: Neurosurgery | Admitting: Neurosurgery

## 2016-06-09 DIAGNOSIS — S065X9A Traumatic subdural hemorrhage with loss of consciousness of unspecified duration, initial encounter: Secondary | ICD-10-CM

## 2016-06-09 DIAGNOSIS — S065XAA Traumatic subdural hemorrhage with loss of consciousness status unknown, initial encounter: Secondary | ICD-10-CM

## 2016-06-09 DIAGNOSIS — Z6827 Body mass index (BMI) 27.0-27.9, adult: Secondary | ICD-10-CM | POA: Diagnosis not present

## 2016-06-09 DIAGNOSIS — I6200 Nontraumatic subdural hemorrhage, unspecified: Secondary | ICD-10-CM | POA: Diagnosis not present

## 2016-06-25 DIAGNOSIS — Z86718 Personal history of other venous thrombosis and embolism: Secondary | ICD-10-CM | POA: Diagnosis not present

## 2016-06-25 DIAGNOSIS — I6200 Nontraumatic subdural hemorrhage, unspecified: Secondary | ICD-10-CM | POA: Diagnosis not present

## 2016-06-25 DIAGNOSIS — E538 Deficiency of other specified B group vitamins: Secondary | ICD-10-CM | POA: Diagnosis not present

## 2016-06-25 DIAGNOSIS — Z6827 Body mass index (BMI) 27.0-27.9, adult: Secondary | ICD-10-CM | POA: Diagnosis not present

## 2016-06-30 ENCOUNTER — Ambulatory Visit: Payer: Medicare Other | Attending: Internal Medicine | Admitting: Rehabilitative and Restorative Service Providers"

## 2016-06-30 DIAGNOSIS — M6281 Muscle weakness (generalized): Secondary | ICD-10-CM

## 2016-06-30 DIAGNOSIS — R2689 Other abnormalities of gait and mobility: Secondary | ICD-10-CM | POA: Diagnosis not present

## 2016-06-30 NOTE — Therapy (Signed)
restricted       Problem List Patient Active Problem List   Diagnosis Date Noted  . SDH (subdural hematoma) ([place]) 05/23/2016  . Fall   . Head injury   . History of DVT of lower extremity   . Lip laceration   . Supratherapeutic INR   . Anticoagulated on Coumadin   . Subdural hematoma ([place])   . Hyperglycemia   . Iron deficiency anemia, unspecified 09/09/2013  . Nonspecific abnormal finding in stool contents 09/09/2013  . CRI (chronic renal insufficiency) 09/03/2013  . Hx of adenomatous colonic polyps 09/03/2013  . CAD (coronary artery disease) of artery bypass graft 09/03/2013  . Acute thromboembolism of deep veins of lower extremity ([place]) 11/01/2009  . DIABETES MELLITUS, TYPE II 10/29/2009  . HYPERLIPIDEMIA 10/29/2009  . ANXIETY 10/29/2009  . Obstructive sleep apnea 10/29/2009  . HYPERTENSION 10/29/2009  . DIVERTICULOSIS, COLON 10/29/2009    Anjel Pardo, PT 06/30/2016, 2:16 PM  [facility] [address] [place], [place], [address] Phone: [phone]   Fax:  [phone]  Name: Alejandro Russell MRN: [id number] Date of Birth: [birth year]  restricted       Problem List Patient Active Problem List   Diagnosis Date Noted  . SDH (subdural hematoma) ([place]) 05/23/2016  . Fall   . Head injury   . History of DVT of lower extremity   . Lip laceration   . Supratherapeutic INR   . Anticoagulated on Coumadin   . Subdural hematoma ([place])   . Hyperglycemia   . Iron deficiency anemia, unspecified 09/09/2013  . Nonspecific abnormal finding in stool contents 09/09/2013  . CRI (chronic renal insufficiency) 09/03/2013  . Hx of adenomatous colonic polyps 09/03/2013  . CAD (coronary artery disease) of artery bypass graft 09/03/2013  . Acute thromboembolism of deep veins of lower extremity ([place]) 11/01/2009  . DIABETES MELLITUS, TYPE II 10/29/2009  . HYPERLIPIDEMIA 10/29/2009  . ANXIETY 10/29/2009  . Obstructive sleep apnea 10/29/2009  . HYPERTENSION 10/29/2009  . DIVERTICULOSIS, COLON 10/29/2009    Anjel Pardo, PT 06/30/2016, 2:16 PM  [facility] [address] [place], [place], [address] Phone: [phone]   Fax:  [phone]  Name: Alejandro Russell MRN: [id number] Date of Birth: [birth year]  [facility] [address] [place], [place], [address] Phone: [phone]   Fax:  [phone]  Physical Therapy Evaluation  Patient Details  Name: Alejandro Russell MRN: [id number] Date of Birth: Jan 01, 1928 Referring Provider: Marton Redwood, MD  Encounter Date: 06/30/2016      PT End of Session - 06/30/16 1405    Visit Number 1   Number of Visits 12   Date for PT Re-Evaluation 08/29/16   Authorization Type G code every 10th visit   PT Start Time 0845   PT Stop Time 0930   PT Time Calculation (min) 45 min   Equipment Utilized During Treatment Gait belt   Activity Tolerance Patient tolerated treatment well   Behavior During Therapy [facility] for tasks assessed/performed      Past Medical History:  Diagnosis Date  . Anemia   . Anxiety   . CAD (coronary artery disease)   . Chronic renal disease, stage III   . Depression   . Diabetes mellitus   . Diverticulosis   . Diverticulosis   . DVT (deep venous thrombosis) ([facility])    on coumadin  . Hyperlipidemia   . Hypertension   . Myocardial infarct   . Nonspecific abnormal finding in stool contents 09/09/2013  . Obesity   . OSA (obstructive sleep apnea)    wears CPAP  . Peripheral neuropathy ([place])   . Psoriasis   . Tubulovillous adenoma of colon 1995  . Type 2 diabetes mellitus ([place])   . Vitamin B12 deficiency     Past Surgical History:  Procedure Laterality Date  . CARDIAC CATHETERIZATION     many yrs ago with stent placed  . CHOLECYSTECTOMY  may 16,2011  . COLONOSCOPY WITH PROPOFOL N/A 09/09/2013   Procedure: COLONOSCOPY WITH PROPOFOL;  Surgeon: Ladene Artist, MD;  Location: [facility];  Service: Endoscopy;  Laterality: N/A;  . CORONARY STENT PLACEMENT    . ESOPHAGOGASTRODUODENOSCOPY (EGD) WITH PROPOFOL N/A 09/09/2013   Procedure: ESOPHAGOGASTRODUODENOSCOPY (EGD) WITH PROPOFOL;  Surgeon: Ladene Artist, MD;  Location: [facility];  Service: Endoscopy;  Laterality:  N/A;  . INGUINAL HERNIA REPAIR      There were no vitals filed for this visit.       Subjective Assessment - 06/30/16 0931    Subjective The patient had a fall on 05/22/2016 when walking up steps and sustained a subdural hematoma, injury to the R knee and injury to the jaw.  The patient notes that he has increased weakness and fear of falling.  The patient was taken off coumadin.  His spouse notes that he is dragging his heels when he walks, which is more pronounced since the fall.    The patient has returned to driving.     Patient is accompained by: Family member   Pertinent History recent fall with subdural hematoma   Patient Stated Goals Keep walking with confidence.     Currently in Pain? Yes   Pain Score --  "conscious of right knee"            [facility] PT Assessment - 06/30/16 [id number]      Assessment   Medical Diagnosis Gait instability   Referring Provider Marton Redwood, MD   Onset Date/Surgical Date 05/22/16   Prior Therapy none     Precautions   Precautions Fall     Restrictions   Weight Bearing Restrictions No     Balance Screen   Has the patient fallen in the past 6 months  restricted       Problem List Patient Active Problem List   Diagnosis Date Noted  . SDH (subdural hematoma) ([place]) 05/23/2016  . Fall   . Head injury   . History of DVT of lower extremity   . Lip laceration   . Supratherapeutic INR   . Anticoagulated on Coumadin   . Subdural hematoma ([place])   . Hyperglycemia   . Iron deficiency anemia, unspecified 09/09/2013  . Nonspecific abnormal finding in stool contents 09/09/2013  . CRI (chronic renal insufficiency) 09/03/2013  . Hx of adenomatous colonic polyps 09/03/2013  . CAD (coronary artery disease) of artery bypass graft 09/03/2013  . Acute thromboembolism of deep veins of lower extremity ([place]) 11/01/2009  . DIABETES MELLITUS, TYPE II 10/29/2009  . HYPERLIPIDEMIA 10/29/2009  . ANXIETY 10/29/2009  . Obstructive sleep apnea 10/29/2009  . HYPERTENSION 10/29/2009  . DIVERTICULOSIS, COLON 10/29/2009    Anjel Pardo, PT 06/30/2016, 2:16 PM  [facility] [address] [place], [place], [address] Phone: [phone]   Fax:  [phone]  Name: Alejandro Russell MRN: [id number] Date of Birth: [birth year]  restricted       Problem List Patient Active Problem List   Diagnosis Date Noted  . SDH (subdural hematoma) ([place]) 05/23/2016  . Fall   . Head injury   . History of DVT of lower extremity   . Lip laceration   . Supratherapeutic INR   . Anticoagulated on Coumadin   . Subdural hematoma ([place])   . Hyperglycemia   . Iron deficiency anemia, unspecified 09/09/2013  . Nonspecific abnormal finding in stool contents 09/09/2013  . CRI (chronic renal insufficiency) 09/03/2013  . Hx of adenomatous colonic polyps 09/03/2013  . CAD (coronary artery disease) of artery bypass graft 09/03/2013  . Acute thromboembolism of deep veins of lower extremity ([place]) 11/01/2009  . DIABETES MELLITUS, TYPE II 10/29/2009  . HYPERLIPIDEMIA 10/29/2009  . ANXIETY 10/29/2009  . Obstructive sleep apnea 10/29/2009  . HYPERTENSION 10/29/2009  . DIVERTICULOSIS, COLON 10/29/2009    Anjel Pardo, PT 06/30/2016, 2:16 PM  [facility] [address] [place], [place], [address] Phone: [phone]   Fax:  [phone]  Name: Alejandro Russell MRN: [id number] Date of Birth: [birth year]

## 2016-06-30 NOTE — Patient Instructions (Signed)
Seated Hamstring Stretch    1. Sit in a chair with the involved leg in another chair.  2. Keep your knee straight and lean forward until you feel a stretch in the back of your leg.  3. Hold the stretch for about 20-30 seconds. Repeat 3 times 2 times per day.   Calf Stretch on Wall    Start by standing in front of a wall or other sturdy object. Step forward with one foot and keep your toes on both feet pointed straight forward. Keep the leg behind you with a straight knee during the stretch.  Hold 20-30 seconds.  Lean forward towards the wall and support yourself with your arms as you allow your front knee to bend until a gentle stretch is felt along the back of your leg that is most behind you.   Move closer or further away from the wall to control the stretch of the back leg. Also you can adjust the bend of the front knee to control the stretch as well.   Perform 3 times per set 2 sessions per day.   Functional Quadriceps: Sit to Stand    Sit on edge of chair, feet flat on floor. Stand upright, extending knees fully. Repeat 10 times per set. Do __1__ sets per session. Do __2__ sessions per day.  http://orth.exer.us/734   Copyright  VHI. All rights reserved.   Toe Raise (Standing)    Rock back on heels.  HOLD ONTO THE COUNTERTOP. Repeat __20__ times per set. Do __1__ sets per session. Do __2__ sessions per day.  http://orth.exer.us/42   Copyright  VHI. All rights reserved.

## 2016-07-04 DIAGNOSIS — E538 Deficiency of other specified B group vitamins: Secondary | ICD-10-CM | POA: Diagnosis not present

## 2016-07-06 ENCOUNTER — Encounter: Payer: Self-pay | Admitting: Physical Therapy

## 2016-07-06 ENCOUNTER — Ambulatory Visit: Payer: Medicare Other | Admitting: Physical Therapy

## 2016-07-06 DIAGNOSIS — R2689 Other abnormalities of gait and mobility: Secondary | ICD-10-CM | POA: Diagnosis not present

## 2016-07-06 DIAGNOSIS — M6281 Muscle weakness (generalized): Secondary | ICD-10-CM | POA: Diagnosis not present

## 2016-07-06 NOTE — Patient Instructions (Addendum)
Perform these in the corner with a chair in front of you for safety:  Feet Together (Compliant Surface) Varied Arm Positions - Eyes Closed    Stand on compliant surface: _pillows_ with feet together and arms as needed for balance. Close eyes and visualize upright position. Hold__20__ seconds. Repeat _3_ times per session. Do _1-2__ sessions per day.  Copyright  VHI. All rights reserved.  Feet Together (Compliant Surface) Head Motion - Eyes Open    With eyes open, standing on compliant surface: _pillows_, feet together, move head slowly:  1. Looking up and down x 10 reps 2. Looking left to right x 10 reps Do _1-2_ sessions per day.  Copyright  VHI. All rights reserved.    Perform these at kitchen counter top or table for balance/safety: "I love a Parade" Lift    High knee marching while walking fwd and then while walking backwards.  Repeat 3 laps. Do _1-2 sessions per day.  http://gt2.exer.us/344   Copyright  VHI. All rights reserved.   Walking on Heels    Walk on heels forward and then backwards to the starting point. Keep toes up as much as possible.  Perform 3 laps each way. Do _1-2_ sessions per day.  Copyright  VHI. All rights reserved.  Walking on Toes    Walk on toes forward and then backwards to the starting point. Repeat for 3 laps each way. 1-2 times per day.  Copyright  VHI. All rights reserved.  Feet Heel-Toe "Tandem"    Arms as needed for balance walk a straight line forward bringing one foot directly in front of the other and then walk a straight line backwards by brining one foot directly behind the other one. Repeat for 3 laps each way. Do 1-2__ sessions per day.  Copyright  VHI. All rights reserved.

## 2016-07-07 ENCOUNTER — Ambulatory Visit: Payer: Medicare Other | Admitting: Physical Therapy

## 2016-07-07 DIAGNOSIS — M6281 Muscle weakness (generalized): Secondary | ICD-10-CM | POA: Diagnosis not present

## 2016-07-07 DIAGNOSIS — R2689 Other abnormalities of gait and mobility: Secondary | ICD-10-CM

## 2016-07-07 NOTE — Therapy (Signed)
[facility] [address] [place], [place], [address] Phone: [phone]   Fax:  [phone]  Physical Therapy Treatment  Patient Details  Name: Alejandro Russell MRN: [id number] Date of Birth: Aug 11, 1927 Referring Provider: Marton Redwood, MD  Encounter Date: 07/06/2016      PT End of Session - 07/06/16 1536    Visit Number 2   Number of Visits 12   Date for PT Re-Evaluation 08/29/16   Authorization Type G code every 10th visit   PT Start Time 1532   PT Stop Time 1615   PT Time Calculation (min) 43 min   Equipment Utilized During Treatment Gait belt   Activity Tolerance Patient tolerated treatment well   Behavior During Therapy [facility] for tasks assessed/performed      Past Medical History:  Diagnosis Date  . Anemia   . Anxiety   . CAD (coronary artery disease)   . Chronic renal disease, stage III   . Depression   . Diabetes mellitus   . Diverticulosis   . Diverticulosis   . DVT (deep venous thrombosis) (HCC)    on coumadin  . Hyperlipidemia   . Hypertension   . Myocardial infarct   . Nonspecific abnormal finding in stool contents 09/09/2013  . Obesity   . OSA (obstructive sleep apnea)    wears CPAP  . Peripheral neuropathy ([place])   . Psoriasis   . Tubulovillous adenoma of colon 1995  . Type 2 diabetes mellitus ([place])   . Vitamin B12 deficiency     Past Surgical History:  Procedure Laterality Date  . CARDIAC CATHETERIZATION     many yrs ago with stent placed  . CHOLECYSTECTOMY  may 16,2011  . COLONOSCOPY WITH PROPOFOL N/A 09/09/2013   Procedure: COLONOSCOPY WITH PROPOFOL;  Surgeon: Ladene Artist, MD;  Location: [facility];  Service: Endoscopy;  Laterality: N/A;  . CORONARY STENT PLACEMENT    . ESOPHAGOGASTRODUODENOSCOPY (EGD) WITH PROPOFOL N/A 09/09/2013   Procedure: ESOPHAGOGASTRODUODENOSCOPY (EGD) WITH PROPOFOL;  Surgeon: Ladene Artist, MD;  Location: [facility];  Service: Endoscopy;  Laterality:  N/A;  . INGUINAL HERNIA REPAIR      There were no vitals filed for this visit.      Subjective Assessment - 07/06/16 1536    Subjective No new complaints. No new falls or pain to report.    Patient is accompained by: Family member   Pertinent History recent fall with subdural hematoma   Patient Stated Goals Keep walking with confidence.     Currently in Pain? No/denies   Pain Score 0-No pain     Treatment: Added the following to pt's HEP today:  Perform these in the corner with a chair in front of you for safety:  Feet Together (Compliant Surface) Varied Arm Positions - Eyes Closed    Stand on compliant surface: _pillows_ with feet together and arms as needed for balance. Close eyes and visualize upright position. Hold__20__ seconds. Repeat _3_ times per session. Do _1-2__ sessions per day.  Copyright  VHI. All rights reserved.  Feet Together (Compliant Surface) Head Motion - Eyes Open    With eyes open, standing on compliant surface: _pillows_, feet together, move head slowly:  1. Looking up and down x 10 reps 2. Looking left to right x 10 reps Do _1-2_ sessions per day.  Copyright  VHI. All rights reserved.    Perform these at kitchen counter top or table for balance/safety: "I love a Database administrator  High knee marching while walking fwd and then while walking backwards.  Repeat 3 laps. Do _1-2 sessions per day.  http://gt2.exer.us/344   Copyright  VHI. All rights reserved.   Walking on Heels    Walk on heels forward and then backwards to the starting point. Keep toes up as much as possible.  Perform 3 laps each way. Do _1-2_ sessions per day.  Copyright  VHI. All rights reserved.  Walking on Toes    Walk on toes forward and then backwards to the starting point. Repeat for 3 laps each way. 1-2 times per day.  Copyright  VHI. All rights reserved.  Feet Heel-Toe "Tandem"    Arms as needed for balance walk a straight line forward bringing  one foot directly in front of the other and then walk a straight line backwards by brining one foot directly behind the other one. Repeat for 3 laps each way. Do 1-2__ sessions per day.  Copyright  VHI. All rights reserved.          Balance Exercises - 07/06/16 1558      Balance Exercises: Standing   SLS with Vectors Solid surface;Other reps (comment);Limitations;Foam/compliant surface     Balance Exercises: Standing   SLS with Vectors Limitations progressing from floor to on blue mat: with 2 tall cones: alternating fwd toe taps x 10 each leg, alternating cross toe taps x 10 each leg on both floor and mat. min guard to min assist on floor, min assist to mod assist on mat. cues on posture, to widen base of support, to slow down and for weight shifting.                          PT Education - 07/06/16 1210    Education provided Yes   Education Details advanced HEP today   Person(s) Educated Patient;Spouse   Methods Explanation;Demonstration;Verbal cues;Handout   Comprehension Verbalized understanding;Returned demonstration;Need further instruction          PT Short Term Goals - 06/30/16 1405      PT SHORT TERM GOAL #1   Title The patient will be indep with HEP for LE strengthening, stretching and high level balance.   Baseline Target date 07/29/2016   Time 4   Period Weeks     PT SHORT TERM GOAL #2   Title The patient will improve Berg score from 43/56 to > or equal to 48/56 to demo dec'ing risk for falls.   Baseline Target date 07/29/2016   Time 4   Period Weeks     PT SHORT TERM GOAL #3   Title The patient will improve gait speed from 1.89 ft/sec to > or equal to 2.4 ft/sec to demonstrate improving functional mobility.   Baseline Target date 07/29/2016   Time 4   Period Weeks     PT SHORT TERM GOAL #4   Title The aptient will improve FGA from 12/30 to > or equal to 18/30 to demonstrate improved dynamic gait abilities for community ambulation.   Baseline Target date  07/29/2016   Time 4   Period Weeks           PT Long Term Goals - 06/30/16 1407      PT LONG TERM GOAL #1   Title The patient will be indep with home exercise progression for post d/c activities.   Baseline Target date 08/29/2016   Time 8   Period Weeks     PT LONG  TERM GOAL #2   Title The patient will improve TUG from 16.88 seconds to < or equal to 13.5 seconds to demo dec'd risk for falls.   Baseline Target date 08/29/2016   Time 8   Period Weeks     PT LONG TERM GOAL #3   Title The patient will improve gait speed from 1.89 ft/sec to > or equal to 2.6 ft/sec to demo transition to "full community ambulator" classification of gait.   Baseline Target date 08/29/2016   Time 8   Period Weeks     PT LONG TERM GOAL #4   Title The patient will improve FGA from 12/30 to > or equal to 20/30 to demo improving dynamic gait.   Baseline Target date 08/29/2016   Time 8   Period Weeks     PT LONG TERM GOAL #5   Title The patient will verbalize understanding of community exercise classes for transition to long term exercise program.   Baseline Target date 08/29/2016   Time 8   Period Weeks           Plan - 07/06/16 1536    Clinical Impression Statement today's session addressed adding to HEP and working on standing balance. No issues reported. Pt is making steady progress toward goals and should benefit from continued PT to progress toward unmet goals.   Rehab Potential Good   PT Frequency 2x / week  *plan to decrease to 1x/week after 4 weeks   PT Duration 8 weeks   PT Treatment/Interventions ADLs/Self Care Home Management;Functional mobility training;Therapeutic activities;Therapeutic exercise;Patient/family education;Neuromuscular re-education;Gait training;Stair training   PT Next Visit Plan  dynamic gait activities and LE strengthening.   Consulted and Agree with Plan of Care Patient;Family member/caregiver   Family Member Consulted spouse      Patient will benefit from skilled  therapeutic intervention in order to improve the following deficits and impairments:  Abnormal gait, Difficulty walking, Decreased mobility, Decreased strength, Postural dysfunction, Decreased balance, Impaired flexibility  Visit Diagnosis: Other abnormalities of gait and mobility  Muscle weakness (generalized)     Problem List Patient Active Problem List   Diagnosis Date Noted  . SDH (subdural hematoma) ([place]) 05/23/2016  . Fall   . Head injury   . History of DVT of lower extremity   . Lip laceration   . Supratherapeutic INR   . Anticoagulated on Coumadin   . Subdural hematoma ([place])   . Hyperglycemia   . Iron deficiency anemia, unspecified 09/09/2013  . Nonspecific abnormal finding in stool contents 09/09/2013  . CRI (chronic renal insufficiency) 09/03/2013  . Hx of adenomatous colonic polyps 09/03/2013  . CAD (coronary artery disease) of artery bypass graft 09/03/2013  . Acute thromboembolism of deep veins of lower extremity ([place]) 11/01/2009  . DIABETES MELLITUS, TYPE II 10/29/2009  . HYPERLIPIDEMIA 10/29/2009  . ANXIETY 10/29/2009  . Obstructive sleep apnea 10/29/2009  . HYPERTENSION 10/29/2009  . DIVERTICULOSIS, COLON 10/29/2009    Willow Ora, PTA, [facility] [address], [place],  [address] [phone] 07/07/16, 12:12 PM   Name: Alejandro Russell MRN: [id number] Date of Birth: [birth year]

## 2016-07-07 NOTE — Therapy (Signed)
[facility] [address] [place], [place], [address] Phone: [phone]   Fax:  [phone]  Physical Therapy Treatment  Patient Details  Name: Alejandro Russell MRN: [id number] Date of Birth: Feb 07, 1928 Referring Provider: Marton Redwood, MD  Encounter Date: 07/07/2016      PT End of Session - 07/07/16 1428    Visit Number 3   Number of Visits 12   Date for PT Re-Evaluation 08/29/16   Authorization Type G code every 10th visit   PT Start Time 1314   PT Stop Time 1400   PT Time Calculation (min) 46 min   Equipment Utilized During Treatment Gait belt   Activity Tolerance Patient tolerated treatment well   Behavior During Therapy [facility] for tasks assessed/performed      Past Medical History:  Diagnosis Date  . Anemia   . Anxiety   . CAD (coronary artery disease)   . Chronic renal disease, stage III   . Depression   . Diabetes mellitus   . Diverticulosis   . Diverticulosis   . DVT (deep venous thrombosis) (HCC)    on coumadin  . Hyperlipidemia   . Hypertension   . Myocardial infarct   . Nonspecific abnormal finding in stool contents 09/09/2013  . Obesity   . OSA (obstructive sleep apnea)    wears CPAP  . Peripheral neuropathy ([place])   . Psoriasis   . Tubulovillous adenoma of colon 1995  . Type 2 diabetes mellitus ([place])   . Vitamin B12 deficiency     Past Surgical History:  Procedure Laterality Date  . CARDIAC CATHETERIZATION     many yrs ago with stent placed  . CHOLECYSTECTOMY  may 16,2011  . COLONOSCOPY WITH PROPOFOL N/A 09/09/2013   Procedure: COLONOSCOPY WITH PROPOFOL;  Surgeon: Ladene Artist, MD;  Location: [facility];  Service: Endoscopy;  Laterality: N/A;  . CORONARY STENT PLACEMENT    . ESOPHAGOGASTRODUODENOSCOPY (EGD) WITH PROPOFOL N/A 09/09/2013   Procedure: ESOPHAGOGASTRODUODENOSCOPY (EGD) WITH PROPOFOL;  Surgeon: Ladene Artist, MD;  [facility];  Service: Endoscopy;  Laterality:  N/A;  . INGUINAL HERNIA REPAIR      There were no vitals filed for this visit.      Subjective Assessment - 07/07/16 1315    Subjective No complaints. No soreness. Denies dizziness or vision changes (PT noted nystagmus with horizontal head turns during balance exercises)   Patient is accompained by: Family member   Pertinent History recent fall with subdural hematoma   Patient Stated Goals Keep walking with confidence.     Currently in Pain? No/denies                         [facility] Adult PT Treatment/Exercise - 07/07/16 1407      Transfers   Transfers Sit to Stand   Sit to Stand 6: Modified independent (Device/Increase time);With upper extremity assist     Ambulation/Gait   Ambulation/Gait Assistance 5: Supervision   Ambulation/Gait Assistance Details vc for incr velocity, step length, and arm swing; as velocity increased, his arm swing increased as well   Ambulation Distance (Feet) 330 Feet  220   Assistive device None   Gait Pattern Decreased arm swing - right;Decreased arm swing - left;Decreased stride length;Step-through pattern   Ambulation Surface Level;Indoor   Gait velocity able to maintain incr x 50 ft and required repeat cues to incr     High Level Balance   High Level  Balance Activities Side stepping;Backward walking;Sudden stops;Head turns;Turns;Negotitating around obstacles   High Level Balance Comments backwards x 20 ft x 2 cuing for incr step length and toe to heel foot placement; sideways 20 ft x 2 vc for keeping feet pointed straight ahead and incr step length; head turns causing decr velocity and step length             Balance Exercises - 07/07/16 1424      Balance Exercises: Standing   Standing Eyes Opened Head turns;Foam/compliant surface  10 reps; blue balance beam horizontal   Standing Eyes Closed Foam/compliant surface;30 secs;3 reps  blue balance beam horizontal;    Stepping Strategy Anterior;Posterior;Lateral;10 reps  each  leg           PT Education - 07/07/16 1427    Education provided Yes   Education Details rationale for high level balance activities to "make normal activities easier"; educated re: ankle vs hip vs step strategy (pt with very delayed step response)   Person(s) Educated Patient;Spouse   Methods Explanation;Demonstration   Comprehension Verbalized understanding;Need further instruction          PT Short Term Goals - 06/30/16 1405      PT SHORT TERM GOAL #1   Title The patient will be indep with HEP for LE strengthening, stretching and high level balance.   Baseline Target date 07/29/2016   Time 4   Period Weeks     PT SHORT TERM GOAL #2   Title The patient will improve Berg score from 43/56 to > or equal to 48/56 to demo dec'ing risk for falls.   Baseline Target date 07/29/2016   Time 4   Period Weeks     PT SHORT TERM GOAL #3   Title The patient will improve gait speed from 1.89 ft/sec to > or equal to 2.4 ft/sec to demonstrate improving functional mobility.   Baseline Target date 07/29/2016   Time 4   Period Weeks     PT SHORT TERM GOAL #4   Title The aptient will improve FGA from 12/30 to > or equal to 18/30 to demonstrate improved dynamic gait abilities for community ambulation.   Baseline Target date 07/29/2016   Time 4   Period Weeks           PT Long Term Goals - 06/30/16 1407      PT LONG TERM GOAL #1   Title The patient will be indep with home exercise progression for post d/c activities.   Baseline Target date 08/29/2016   Time 8   Period Weeks     PT LONG TERM GOAL #2   Title The patient will improve TUG from 16.88 seconds to < or equal to 13.5 seconds to demo dec'd risk for falls.   Baseline Target date 08/29/2016   Time 8   Period Weeks     PT LONG TERM GOAL #3   Title The patient will improve gait speed from 1.89 ft/sec to > or equal to 2.6 ft/sec to demo transition to "full community ambulator" classification of gait.   Baseline Target date 08/29/2016    Time 8   Period Weeks     PT LONG TERM GOAL #4   Title The patient will improve FGA from 12/30 to > or equal to 20/30 to demo improving dynamic gait.   Baseline Target date 08/29/2016   Time 8   Period Weeks     PT LONG TERM GOAL #5   Title The  patient will verbalize understanding of community exercise classes for transition to long term exercise program.   Baseline Target date 08/29/2016   Time 8   Period Weeks               Plan - 07/07/16 1429    Clinical Impression Statement Focused on high-level balance activities eliciting ankle strategy, to lesser extent pt uses hip strategy, and never used a stepping strategy (?waiting for PT to catch him vs decr awareness). Worked on increased step length with all activities. Patient with multiple losses of balance requiring min-guard to min assist to recover throughout activities.    Rehab Potential Good   PT Frequency 2x / week  *plan to decrease to 1x/week after 4 weeks   PT Duration 8 weeks   PT Treatment/Interventions ADLs/Self Care Home Management;Functional mobility training;Therapeutic activities;Therapeutic exercise;Patient/family education;Neuromuscular re-education;Gait training;Stair training   PT Next Visit Plan continue gait activities (incr velocity, arm swing); ?step over hurdles as pt with poor confidence & ability in SLS; try to elicit stepping strategy with LOB   Consulted and Agree with Plan of Care Patient;Family member/caregiver   Family Member Consulted spouse      Patient will benefit from skilled therapeutic intervention in order to improve the following deficits and impairments:  Abnormal gait, Difficulty walking, Decreased mobility, Decreased strength, Postural dysfunction, Decreased balance, Impaired flexibility  Visit Diagnosis: Other abnormalities of gait and mobility  Muscle weakness (generalized)     Problem List Patient Active Problem List   Diagnosis Date Noted  . SDH (subdural hematoma)  ([place]) 05/23/2016  . Fall   . Head injury   . History of DVT of lower extremity   . Lip laceration   . Supratherapeutic INR   . Anticoagulated on Coumadin   . Subdural hematoma ([place])   . Hyperglycemia   . Iron deficiency anemia, unspecified 09/09/2013  . Nonspecific abnormal finding in stool contents 09/09/2013  . CRI (chronic renal insufficiency) 09/03/2013  . Hx of adenomatous colonic polyps 09/03/2013  . CAD (coronary artery disease) of artery bypass graft 09/03/2013  . Acute thromboembolism of deep veins of lower extremity ([place]) 11/01/2009  . DIABETES MELLITUS, TYPE II 10/29/2009  . HYPERLIPIDEMIA 10/29/2009  . ANXIETY 10/29/2009  . Obstructive sleep apnea 10/29/2009  . HYPERTENSION 10/29/2009  . DIVERTICULOSIS, COLON 10/29/2009    Rexanne Mano, PT 07/07/2016, 2:35 PM  [facility] [address] [place], [place], [address] Phone: [phone]   Fax:  [phone]  Name: Alejandro Russell MRN: [id number] Date of Birth: 1928/03/26

## 2016-07-10 ENCOUNTER — Ambulatory Visit: Payer: Medicare Other | Admitting: Physical Therapy

## 2016-07-10 DIAGNOSIS — R2689 Other abnormalities of gait and mobility: Secondary | ICD-10-CM | POA: Diagnosis not present

## 2016-07-10 DIAGNOSIS — M6281 Muscle weakness (generalized): Secondary | ICD-10-CM | POA: Diagnosis not present

## 2016-07-10 NOTE — Patient Instructions (Signed)
WALKING  Walking is a great form of exercise to increase your strength, endurance and overall fitness.  A walking program can help you start slowly and gradually build endurance as you go.  Everyone's ability is different, so each person's starting point will be different.  You do not have to follow them exactly.  The are just samples. You should simply find out what's right for you and stick to that program.   In the beginning, you'll start off walking 2-3 times a day for short distances.  As you get stronger, you'll be walking further at just 1-2 times per day.  A. You Can Walk For A Certain Length Of Time Each Day    Walk 3 minutes 3 times per day.  Increase 1 minutes every 7 days (3 times per day).  Work up to 25-30 minutes (1-2 times per day).   B. You Can Walk For a Certain Distance Each Day     Distance can be substituted for time.    Example:   3 trips to mailbox (at road)   3 trips to corner of block   3 trips around the block  C. Go to local [employer] and use the track.    Walk for distance ____ around track  Or time ____ minutes  Focus on increasing how FASTt you walk! Long steps. Don't think about your arms.   Please only do the exercises that your therapist has initialed and dated

## 2016-07-10 NOTE — Therapy (Signed)
[facility] [address] [place], [place], [address] Phone: [phone]   Fax:  [phone]  Physical Therapy Treatment  Patient Details  Name: Alejandro Russell MRN: [id number] Date of Birth: 05-16-1928 Referring Provider: Marton Redwood, MD  Encounter Date: 07/10/2016      PT End of Session - 07/10/16 2132    Visit Number 4   Number of Visits 12   Date for PT Re-Evaluation 08/29/16   Authorization Type G code every 10th visit   PT Start Time 1345  (therapist late to initiate session)   PT Stop Time 1430   PT Time Calculation (min) 45 min   Equipment Utilized During Treatment Gait belt   Activity Tolerance Patient tolerated treatment well   Behavior During Therapy [facility] for tasks assessed/performed      Past Medical History:  Diagnosis Date  . Anemia   . Anxiety   . CAD (coronary artery disease)   . Chronic renal disease, stage III   . Depression   . Diabetes mellitus   . Diverticulosis   . Diverticulosis   . DVT (deep venous thrombosis) (HCC)    on coumadin  . Hyperlipidemia   . Hypertension   . Myocardial infarct   . Nonspecific abnormal finding in stool contents 09/09/2013  . Obesity   . OSA (obstructive sleep apnea)    wears CPAP  . Peripheral neuropathy ([place])   . Psoriasis   . Tubulovillous adenoma of colon 1995  . Type 2 diabetes mellitus ([place])   . Vitamin B12 deficiency     Past Surgical History:  Procedure Laterality Date  . CARDIAC CATHETERIZATION     many yrs ago with stent placed  . CHOLECYSTECTOMY  may 16,2011  . COLONOSCOPY WITH PROPOFOL N/A 09/09/2013   Procedure: COLONOSCOPY WITH PROPOFOL;  Surgeon: Ladene Artist, MD;  Location: [facility] ENDOSCOPY;  Service: Endoscopy;  Laterality: N/A;  . CORONARY STENT PLACEMENT    . ESOPHAGOGASTRODUODENOSCOPY (EGD) WITH PROPOFOL N/A 09/09/2013   Procedure: ESOPHAGOGASTRODUODENOSCOPY (EGD) WITH PROPOFOL;  Surgeon: Ladene Artist, MD;  Location: [facility]  ENDOSCOPY;  Service: Endoscopy;  Laterality: N/A;  . INGUINAL HERNIA REPAIR      There were no vitals filed for this visit.      Subjective Assessment - 07/10/16 1347    Subjective Reports he has been working on HEP. Wife bought foam square for balance exercises. Has been walking and trying to improve arm swing   Patient is accompained by: Family member  wife   Pertinent History recent fall with subdural hematoma   Patient Stated Goals Keep walking with confidence.     Currently in Pain? No/denies                         [facility] Adult PT Treatment/Exercise - 07/10/16 0001      Transfers   Transfers Sit to Stand   Sit to Stand 6: Modified independent (Device/Increase time);With upper extremity assist     Ambulation/Gait   Ambulation/Gait Assistance 5: Supervision;4: Min assist   Ambulation/Gait Assistance Details vc for incr velocity, step length, and arm swing; assisted arm swing with use of walking poles (pt holding one end, PT walks behind holding other end; pt able to carryover correct sequencing and incr amplitude of arm swing for up to 40 ft; as velocity increased, his arm swing increased as well   Ambulation Distance (Feet) 220 Feet  100 x 7   Assistive  device None   Gait Pattern Decreased arm swing - right;Decreased arm swing - left;Decreased stride length;Step-through pattern   Ambulation Surface Level;Indoor   Gait velocity tactile cues and max verbal cues to incr velocity   Stair Management Technique One rail Left;Alternating pattern;Forwards;No rails   Number of Stairs 4  also curb (1) x 2     Exercises   Exercises Knee/Hip     Knee/Hip Exercises: Standing   Forward Step Up Right;Left;3 sets;10 reps;Hand Hold: 1;Hand Hold: 0;Step Height: 4";Step Height: 6"   Forward Step Up Limitations 2 sets at steps with lt rail, up/down one step for strengthening; at 4" step without rail, step-taps with emphasis on arms & legs working in Engineer, mining Exercises - 07/10/16 2120      Balance Exercises: Standing   Standing Eyes Opened Narrow base of support (BOS);Head turns;Foam/compliant surface;Other reps (comment)  blue mat on ramp (uphill & down); 10 reps, 2 sets; horiz/   Standing Eyes Closed Narrow base of support (BOS);Foam/compliant surface;2 reps;30 secs  blue mat on ramp facing uphill & downhill   Other Standing Exercises sideways at counter, light UE support; forward step, lift back leg and then big step backwards (with weight shifts ant/post) and incr step length           PT Education - 07/10/16 2130    Education provided Yes   Education Details Educated on starting walking program for HEP. Educated in use of indoor spaces/stores when weather poor. Patient/wife educated to emphasize incr velocity.    Person(s) Educated Spouse;Patient   Methods Explanation;Demonstration;Handout   Comprehension Verbalized understanding;Returned demonstration;Need further instruction          PT Short Term Goals - 06/30/16 1405      PT SHORT TERM GOAL #1   Title The patient will be indep with HEP for LE strengthening, stretching and high level balance.   Baseline Target date 07/29/2016   Time 4   Period Weeks     PT SHORT TERM GOAL #2   Title The patient will improve Berg score from 43/56 to > or equal to 48/56 to demo dec'ing risk for falls.   Baseline Target date 07/29/2016   Time 4   Period Weeks     PT SHORT TERM GOAL #3   Title The patient will improve gait speed from 1.89 ft/sec to > or equal to 2.4 ft/sec to demonstrate improving functional mobility.   Baseline Target date 07/29/2016   Time 4   Period Weeks     PT SHORT TERM GOAL #4   Title The aptient will improve FGA from 12/30 to > or equal to 18/30 to demonstrate improved dynamic gait abilities for community ambulation.   Baseline Target date 07/29/2016   Time 4   Period Weeks           PT Long Term Goals - 06/30/16 1407      PT LONG TERM GOAL #1    Title The patient will be indep with home exercise progression for post d/c activities.   Baseline Target date 08/29/2016   Time 8   Period Weeks     PT LONG TERM GOAL #2   Title The patient will improve TUG from 16.88 seconds to < or equal to 13.5 seconds to demo dec'd risk for falls.   Baseline Target date 08/29/2016   Time 8   Period Weeks  PT LONG TERM GOAL #3   Title The patient will improve gait speed from 1.89 ft/sec to > or equal to 2.6 ft/sec to demo transition to "full community ambulator" classification of gait.   Baseline Target date 08/29/2016   Time 8   Period Weeks     PT LONG TERM GOAL #4   Title The patient will improve FGA from 12/30 to > or equal to 20/30 to demo improving dynamic gait.   Baseline Target date 08/29/2016   Time 8   Period Weeks     PT LONG TERM GOAL #5   Title The patient will verbalize understanding of community exercise classes for transition to long term exercise program.   Baseline Target date 08/29/2016   Time 8   Period Weeks               Plan - 07/10/16 2133    Clinical Impression Statement Focused on ambulation to incr velocity, step length and arm swing with goal of improved stability. Patient with great difficulty coordinating UE movements in opposition to LE movements. Noted carryover for limited time after active-assisted arm swing, including periods of recognizing his arm swing was incorrect and able to re-sequence.    Rehab Potential Good   PT Frequency 2x / week  *plan to decrease to 1x/week after 4 weeks   PT Duration 8 weeks   PT Treatment/Interventions ADLs/Self Care Home Management;Functional mobility training;Therapeutic activities;Therapeutic exercise;Patient/family education;Neuromuscular re-education;Gait training;Stair training;Balance training   PT Next Visit Plan continue gait activities (incr velocity, step length, & arm swing); ?step over hurdles as pt with poor confidence & ability in SLS; try to elicit stepping  strategy with LOB   Consulted and Agree with Plan of Care Patient;Family member/caregiver   Family Member Consulted spouse      Patient will benefit from skilled therapeutic intervention in order to improve the following deficits and impairments:  Abnormal gait, Difficulty walking, Decreased mobility, Decreased strength, Postural dysfunction, Decreased balance, Impaired flexibility  Visit Diagnosis: Other abnormalities of gait and mobility  Muscle weakness (generalized)     Problem List Patient Active Problem List   Diagnosis Date Noted  . SDH (subdural hematoma) ([place]) 05/23/2016  . Fall   . Head injury   . History of DVT of lower extremity   . Lip laceration   . Supratherapeutic INR   . Anticoagulated on Coumadin   . Subdural hematoma ([place])   . Hyperglycemia   . Iron deficiency anemia, unspecified 09/09/2013  . Nonspecific abnormal finding in stool contents 09/09/2013  . CRI (chronic renal insufficiency) 09/03/2013  . Hx of adenomatous colonic polyps 09/03/2013  . CAD (coronary artery disease) of artery bypass graft 09/03/2013  . Acute thromboembolism of deep veins of lower extremity ([place]) 11/01/2009  . DIABETES MELLITUS, TYPE II 10/29/2009  . HYPERLIPIDEMIA 10/29/2009  . ANXIETY 10/29/2009  . Obstructive sleep apnea 10/29/2009  . HYPERTENSION 10/29/2009  . DIVERTICULOSIS, COLON 10/29/2009    Rexanne Mano, PT 07/10/2016, 9:39 PM  [facility] [address] [place], [place], [address] Phone: [phone]   Fax:  [phone]  Name: Alejandro Russell MRN: [id number] Date of Birth: 1928/05/03

## 2016-07-14 ENCOUNTER — Ambulatory Visit: Payer: Medicare Other | Admitting: Physical Therapy

## 2016-07-17 ENCOUNTER — Ambulatory Visit: Payer: Medicare Other | Admitting: Rehabilitative and Restorative Service Providers"

## 2016-07-17 DIAGNOSIS — R2689 Other abnormalities of gait and mobility: Secondary | ICD-10-CM | POA: Diagnosis not present

## 2016-07-17 DIAGNOSIS — M6281 Muscle weakness (generalized): Secondary | ICD-10-CM

## 2016-07-17 NOTE — Therapy (Signed)
Statement The patient met all STGs and 3 LTGs early.  He is participating in community exercise class and he and his spouse note an increased confidence with his mobility.  PT recommends f/u on 3/2 to consolidate HEP (pick 3 challenging exercises for post d/c) and check remaining goals.  May need 1-2 more sessions spaced apart over 2-3 weeks as indicated to monitor patient progress.     PT Treatment/Interventions ADLs/Self Care Home Management;Functional mobility training;Therapeutic activities;Therapeutic exercise;Patient/family education;Neuromuscular re-education;Gait  training;Stair training;Balance training   PT Next Visit Plan Check LTGs, update HEP (pick 3 to continue moving forward); look at SLS + stepping strategies.   Consulted and Agree with Plan of Care Patient;Family member/caregiver   Family Member Consulted spouse      Patient will benefit from skilled therapeutic intervention in order to improve the following deficits and impairments:  Abnormal gait, Difficulty walking, Decreased mobility, Decreased strength, Postural dysfunction, Decreased balance, Impaired flexibility  Visit Diagnosis: Other abnormalities of gait and mobility  Muscle weakness (generalized)     Problem List Patient Active Problem List   Diagnosis Date Noted  . SDH (subdural hematoma) ([place]) 05/23/2016  . Fall   . Head injury   . History of DVT of lower extremity   . Lip laceration   . Supratherapeutic INR   . Anticoagulated on Coumadin   . Subdural hematoma ([place])   . Hyperglycemia   . Iron deficiency anemia, unspecified 09/09/2013  . Nonspecific abnormal finding in stool contents 09/09/2013  . CRI (chronic renal insufficiency) 09/03/2013  . Hx of adenomatous colonic polyps 09/03/2013  . CAD (coronary artery disease) of artery bypass graft 09/03/2013  . Acute thromboembolism of deep veins of lower extremity ([place]) 11/01/2009  . DIABETES MELLITUS, TYPE II 10/29/2009  . HYPERLIPIDEMIA 10/29/2009  . ANXIETY 10/29/2009  . Obstructive sleep apnea 10/29/2009  . HYPERTENSION 10/29/2009  . DIVERTICULOSIS, COLON 10/29/2009    ,, PT 07/17/2016, 2:49 PM  [facility] [address] [place], [place], [address] Phone: [phone]   Fax:  [phone]  Name: Alejandro Russell MRN: [id number] Date of Birth: 1928/03/25  Statement The patient met all STGs and 3 LTGs early.  He is participating in community exercise class and he and his spouse note an increased confidence with his mobility.  PT recommends f/u on 3/2 to consolidate HEP (pick 3 challenging exercises for post d/c) and check remaining goals.  May need 1-2 more sessions spaced apart over 2-3 weeks as indicated to monitor patient progress.     PT Treatment/Interventions ADLs/Self Care Home Management;Functional mobility training;Therapeutic activities;Therapeutic exercise;Patient/family education;Neuromuscular re-education;Gait  training;Stair training;Balance training   PT Next Visit Plan Check LTGs, update HEP (pick 3 to continue moving forward); look at SLS + stepping strategies.   Consulted and Agree with Plan of Care Patient;Family member/caregiver   Family Member Consulted spouse      Patient will benefit from skilled therapeutic intervention in order to improve the following deficits and impairments:  Abnormal gait, Difficulty walking, Decreased mobility, Decreased strength, Postural dysfunction, Decreased balance, Impaired flexibility  Visit Diagnosis: Other abnormalities of gait and mobility  Muscle weakness (generalized)     Problem List Patient Active Problem List   Diagnosis Date Noted  . SDH (subdural hematoma) ([place]) 05/23/2016  . Fall   . Head injury   . History of DVT of lower extremity   . Lip laceration   . Supratherapeutic INR   . Anticoagulated on Coumadin   . Subdural hematoma ([place])   . Hyperglycemia   . Iron deficiency anemia, unspecified 09/09/2013  . Nonspecific abnormal finding in stool contents 09/09/2013  . CRI (chronic renal insufficiency) 09/03/2013  . Hx of adenomatous colonic polyps 09/03/2013  . CAD (coronary artery disease) of artery bypass graft 09/03/2013  . Acute thromboembolism of deep veins of lower extremity ([place]) 11/01/2009  . DIABETES MELLITUS, TYPE II 10/29/2009  . HYPERLIPIDEMIA 10/29/2009  . ANXIETY 10/29/2009  . Obstructive sleep apnea 10/29/2009  . HYPERTENSION 10/29/2009  . DIVERTICULOSIS, COLON 10/29/2009    ,, PT 07/17/2016, 2:49 PM  [facility] [address] [place], [place], [address] Phone: [phone]   Fax:  [phone]  Name: Alejandro Russell MRN: [id number] Date of Birth: 1928/03/25  Statement The patient met all STGs and 3 LTGs early.  He is participating in community exercise class and he and his spouse note an increased confidence with his mobility.  PT recommends f/u on 3/2 to consolidate HEP (pick 3 challenging exercises for post d/c) and check remaining goals.  May need 1-2 more sessions spaced apart over 2-3 weeks as indicated to monitor patient progress.     PT Treatment/Interventions ADLs/Self Care Home Management;Functional mobility training;Therapeutic activities;Therapeutic exercise;Patient/family education;Neuromuscular re-education;Gait  training;Stair training;Balance training   PT Next Visit Plan Check LTGs, update HEP (pick 3 to continue moving forward); look at SLS + stepping strategies.   Consulted and Agree with Plan of Care Patient;Family member/caregiver   Family Member Consulted spouse      Patient will benefit from skilled therapeutic intervention in order to improve the following deficits and impairments:  Abnormal gait, Difficulty walking, Decreased mobility, Decreased strength, Postural dysfunction, Decreased balance, Impaired flexibility  Visit Diagnosis: Other abnormalities of gait and mobility  Muscle weakness (generalized)     Problem List Patient Active Problem List   Diagnosis Date Noted  . SDH (subdural hematoma) ([place]) 05/23/2016  . Fall   . Head injury   . History of DVT of lower extremity   . Lip laceration   . Supratherapeutic INR   . Anticoagulated on Coumadin   . Subdural hematoma ([place])   . Hyperglycemia   . Iron deficiency anemia, unspecified 09/09/2013  . Nonspecific abnormal finding in stool contents 09/09/2013  . CRI (chronic renal insufficiency) 09/03/2013  . Hx of adenomatous colonic polyps 09/03/2013  . CAD (coronary artery disease) of artery bypass graft 09/03/2013  . Acute thromboembolism of deep veins of lower extremity ([place]) 11/01/2009  . DIABETES MELLITUS, TYPE II 10/29/2009  . HYPERLIPIDEMIA 10/29/2009  . ANXIETY 10/29/2009  . Obstructive sleep apnea 10/29/2009  . HYPERTENSION 10/29/2009  . DIVERTICULOSIS, COLON 10/29/2009    ,, PT 07/17/2016, 2:49 PM  [facility] [address] [place], [place], [address] Phone: [phone]   Fax:  [phone]  Name: Alejandro Russell MRN: [id number] Date of Birth: 1928/03/25  Statement The patient met all STGs and 3 LTGs early.  He is participating in community exercise class and he and his spouse note an increased confidence with his mobility.  PT recommends f/u on 3/2 to consolidate HEP (pick 3 challenging exercises for post d/c) and check remaining goals.  May need 1-2 more sessions spaced apart over 2-3 weeks as indicated to monitor patient progress.     PT Treatment/Interventions ADLs/Self Care Home Management;Functional mobility training;Therapeutic activities;Therapeutic exercise;Patient/family education;Neuromuscular re-education;Gait  training;Stair training;Balance training   PT Next Visit Plan Check LTGs, update HEP (pick 3 to continue moving forward); look at SLS + stepping strategies.   Consulted and Agree with Plan of Care Patient;Family member/caregiver   Family Member Consulted spouse      Patient will benefit from skilled therapeutic intervention in order to improve the following deficits and impairments:  Abnormal gait, Difficulty walking, Decreased mobility, Decreased strength, Postural dysfunction, Decreased balance, Impaired flexibility  Visit Diagnosis: Other abnormalities of gait and mobility  Muscle weakness (generalized)     Problem List Patient Active Problem List   Diagnosis Date Noted  . SDH (subdural hematoma) ([place]) 05/23/2016  . Fall   . Head injury   . History of DVT of lower extremity   . Lip laceration   . Supratherapeutic INR   . Anticoagulated on Coumadin   . Subdural hematoma ([place])   . Hyperglycemia   . Iron deficiency anemia, unspecified 09/09/2013  . Nonspecific abnormal finding in stool contents 09/09/2013  . CRI (chronic renal insufficiency) 09/03/2013  . Hx of adenomatous colonic polyps 09/03/2013  . CAD (coronary artery disease) of artery bypass graft 09/03/2013  . Acute thromboembolism of deep veins of lower extremity ([place]) 11/01/2009  . DIABETES MELLITUS, TYPE II 10/29/2009  . HYPERLIPIDEMIA 10/29/2009  . ANXIETY 10/29/2009  . Obstructive sleep apnea 10/29/2009  . HYPERTENSION 10/29/2009  . DIVERTICULOSIS, COLON 10/29/2009    ,, PT 07/17/2016, 2:49 PM  [facility] [address] [place], [place], [address] Phone: [phone]   Fax:  [phone]  Name: Alejandro Russell MRN: [id number] Date of Birth: 1928/03/25

## 2016-07-21 ENCOUNTER — Encounter: Payer: Self-pay | Admitting: Physical Therapy

## 2016-07-21 DIAGNOSIS — R2681 Unsteadiness on feet: Secondary | ICD-10-CM | POA: Diagnosis not present

## 2016-07-21 DIAGNOSIS — Z6828 Body mass index (BMI) 28.0-28.9, adult: Secondary | ICD-10-CM | POA: Diagnosis not present

## 2016-07-21 DIAGNOSIS — Z86718 Personal history of other venous thrombosis and embolism: Secondary | ICD-10-CM | POA: Diagnosis not present

## 2016-07-21 DIAGNOSIS — I809 Phlebitis and thrombophlebitis of unspecified site: Secondary | ICD-10-CM | POA: Diagnosis not present

## 2016-07-24 ENCOUNTER — Encounter: Payer: Self-pay | Admitting: Rehabilitative and Restorative Service Providers"

## 2016-07-28 ENCOUNTER — Ambulatory Visit: Payer: Medicare Other | Attending: Internal Medicine | Admitting: Rehabilitative and Restorative Service Providers"

## 2016-07-28 DIAGNOSIS — R2689 Other abnormalities of gait and mobility: Secondary | ICD-10-CM | POA: Diagnosis not present

## 2016-07-28 DIAGNOSIS — M6281 Muscle weakness (generalized): Secondary | ICD-10-CM | POA: Diagnosis not present

## 2016-07-28 NOTE — Patient Instructions (Signed)
Perform these in the corner with a chair in front of you for safety:  Feet Together (Compliant Surface) Varied Arm Positions - Eyes Closed    Stand on compliant surface: _pillows_ with feet together and arms as needed for balance. Close eyes and visualize upright position. Hold__20__ seconds. Repeat _3_ times per session. Do _1-2__ sessions per day.  Copyright  VHI. All rights reserved.  Feet Together (Compliant Surface) Head Motion - Eyes Open    With eyes open, standing on compliant surface: _pillows_, feet together, move head slowly:  1. Looking up and down x 10 reps 2. Looking left to right x 10 reps Do _1-2_ sessions per day.  Copyright  VHI. All rights reserved.    Perform these at kitchen counter top or table for balance/safety: "I love a Parade" Lift    High knee marching in place working on slow speed for more control. Do _1-2 sessions per day.  http://gt2.exer.us/344   Copyright  VHI. All rights reserved.   Walking on Heels    Walk on heels forward and then backwards to the starting point. Keep toes up as much as possible.  Perform 3 laps each way. Do _1-2_ sessions per day.  Copyright  VHI. All rights reserved.  Walking on Toes    Walk on toes forward and then backwards to the starting point. Repeat for 3 laps each way. 1-2 times per day.  Copyright  VHI. All rights reserved.   Anterior Step-Up    MODIFIED FROM PICTURE:  Stand at the bottom of indoor steps not holding rail (unless you need it).  Tap right foot up to the 2nd step and then tap the left foot up to the 2nd step. Alternate legs x 10 times each side.    http://gglj.exer.us/160   Copyright  VHI. All rights reserved.

## 2016-07-30 NOTE — Therapy (Signed)
[place] [address] [place], [place], [address] Phone: [phone]   Fax:  [phone]  Physical Therapy Treatment and Discharge Summary  Patient Details  Name: Alejandro Russell MRN: [id number] Date of Birth: 1928-05-09 Referring Provider: Marton Redwood, MD  Encounter Date: 07/28/2016      PT End of Session - 07/30/16 2110    Visit Number 6   Number of Visits 12   Date for PT Re-Evaluation 08/29/16   Authorization Type G code every 10th visit   PT Start Time 1322   PT Stop Time 1400   PT Time Calculation (min) 38 min   Activity Tolerance Patient tolerated treatment well   Behavior During Therapy [facility] for tasks assessed/performed      Past Medical History:  Diagnosis Date  . Anemia   . Anxiety   . CAD (coronary artery disease)   . Chronic renal disease, stage III   . Depression   . Diabetes mellitus   . Diverticulosis   . Diverticulosis   . DVT (deep venous thrombosis) (HCC)    on coumadin  . Hyperlipidemia   . Hypertension   . Myocardial infarct   . Nonspecific abnormal finding in stool contents 09/09/2013  . Obesity   . OSA (obstructive sleep apnea)    wears CPAP  . Peripheral neuropathy ([place])   . Psoriasis   . Tubulovillous adenoma of colon 1995  . Type 2 diabetes mellitus ([place])   . Vitamin B12 deficiency     Past Surgical History:  Procedure Laterality Date  . CARDIAC CATHETERIZATION     many yrs ago with stent placed  . CHOLECYSTECTOMY  may 16,2011  . COLONOSCOPY WITH PROPOFOL N/A 09/09/2013   Procedure: COLONOSCOPY WITH PROPOFOL;  Surgeon: Ladene Artist, MD;  Location: [facility]OS[facility];  Service: Endoscopy;  Laterality: N/A;  . CORONARY STENT PLACEMENT    . ESOPHAGOGASTRODUODENOSCOPY (EGD) WITH PROPOFOL N/A 09/09/2013   Procedure: ESOPHAGOGASTRODUODENOSCOPY (EGD) WITH PROPOFOL;  Surgeon: Ladene Artist, MD;  Location: [facility];  Service: Endoscopy;  Laterality: N/A;  . INGUINAL HERNIA  REPAIR      There were no vitals filed for this visit.                       [place] Adult PT Treatment/Exercise - 07/30/16 0001      Ambulation/Gait   Ambulation/Gait Yes   Ambulation/Gait Assistance 6: Modified independent (Device/Increase time)   Ambulation Distance (Feet) 400 Feet   Assistive device None   Ambulation Surface Level   Gait Comments TUG performed= see goal section.   Dynamic gait activities working on backwards walking, starting/stops during gait activities and direction changes.      Self-Care   Self-Care Other Self-Care Comments   Other Self-Care Comments  Discussed post d/c home exercise program, discussed functional tasks in which patient feels unsure of his balance.  The only reported difficulty is negotiating steps without a handrail, which is done when visiting family member's home.  This was the original location of his fall.  The patient's spouse notes he has dec'd foot cleraance at times on the steps.  PT provided new exercise for home at bottom of his flight of stairs with SBA and handrail as needed working on foot clearance for continued safety on stairs.  Also emphasized continuation of heel walking and toe walking for ankle strengthening and marching as these specific exercises will help maintain performance on steps.  Neuro Re-ed    Neuro Re-ed Details  Reviewed all prior HEP including corner standing on compliant surfaces, tandem walking, heel walking, toe walking, marching made more difficult by slowing movements.  PT provided consolidated program *see patient instructions.  Also added alternating foot taps to 12" surface (2nd step) near support surface dec'ing UE support.                  PT Education - 07/30/16 2109    Education provided Yes   Education Details Consolidated HEP to be performed 3 days/week since patient is going to community exercise 3 days/week with his wife.   Person(s) Educated Patient;Spouse   Methods  Explanation;Demonstration;Handout   Comprehension Verbalized understanding;Returned demonstration          PT Short Term Goals - 07/17/16 1400      PT SHORT TERM GOAL #1   Title The patient will be indep with HEP for LE strengthening, stretching and high level balance.   Baseline Met on 07/17/16   Time 4   Period Weeks   Status Achieved     PT SHORT TERM GOAL #2   Title The patient will improve Berg score from 43/56 to > or equal to 48/56 to demo dec'ing risk for falls.   Baseline Improved to 48/56 on 07/17/16   Time 4   Period Weeks   Status Achieved     PT SHORT TERM GOAL #3   Title The patient will improve gait speed from 1.89 ft/sec to > or equal to 2.4 ft/sec to demonstrate improving functional mobility.   Baseline Met scoring 2.66 ft/sec on 07/17/16   Time 4   Period Weeks   Status Achieved     PT SHORT TERM GOAL #4   Title The aptient will improve FGA from 12/30 to > or equal to 18/30 to demonstrate improved dynamic gait abilities for community ambulation.   Baseline Met scoring 20/30 on 07/17/16   Time 4   Period Weeks   Status Achieved           PT Long Term Goals - 07/28/16 1359      PT LONG TERM GOAL #1   Title The patient will be indep with home exercise progression for post d/c activities.   Baseline Target date 08/29/2016   Time 8   Period Weeks   Status Achieved     PT LONG TERM GOAL #2   Title The patient will improve TUG from 16.88 seconds to < or equal to 13.5 seconds to demo dec'd risk for falls.   Baseline Remains consistent at 16.8 seconds for TUG.  Patient moves at slow, deliberate speed.    Time 8   Period Weeks   Status Not Met     PT LONG TERM GOAL #3   Title The patient will improve gait speed from 1.89 ft/sec to > or equal to 2.6 ft/sec to demo transition to "full community ambulator" classification of gait.   Baseline Met per STG assessment on 2/19.   Time 8   Period Weeks   Status Achieved     PT LONG TERM GOAL #4   Title The  patient will improve FGA from 12/30 to > or equal to 20/30 to demo improving dynamic gait.   Baseline Met per STG assessment on 07/17/16.   Time 8   Period Weeks   Status Achieved     PT LONG TERM GOAL #5   Title The patient will verbalize understanding of  community exercise classes for transition to long term exercise program.   Baseline Met on 07/17/16 with patient and spouse performing 3x/week exercise class at community center   Time [place] - 07/30/16 2111    Clinical Impression Statement The patient met 4/5 LTGs.  He did not meet LTG for TUG test noting he prefers to move cautiously.  The patient is participating in community program and is compliant with home program.  PT and patient + his spouse discussed continued activities to maintain mobility and improve overall confidence during functional tasks.     PT Treatment/Interventions ADLs/Self Care Home Management;Functional mobility training;Therapeutic activities;Therapeutic exercise;Patient/family education;Neuromuscular re-education;Gait training;Stair training;Balance training   PT Next Visit Plan Discharge today.    Consulted and Agree with Plan of Care Patient      Patient will benefit from skilled therapeutic intervention in order to improve the following deficits and impairments:  Abnormal gait, Difficulty walking, Decreased mobility, Decreased strength, Postural dysfunction, Decreased balance, Impaired flexibility  Visit Diagnosis: Other abnormalities of gait and mobility  Muscle weakness (generalized)    PHYSICAL THERAPY DISCHARGE SUMMARY  Visits from Start of Care: 6  Current functional level related to goals / functional outcomes: See above   Remaining deficits: Dec'd high level balance, decreased single limb stance control.  Slowed gait speed on TUG indicating continued risk for falls.  Berg and gait speed no longer indicate fall risk category.    Education / Equipment: HEP, community wellness, fall prevention, home safety (use of flash lights at night when outside and night light in home).   Plan: Patient agrees to discharge.  Patient goals were partially met. Patient is being discharged due to meeting the stated rehab goals.  ?????        Thank you for the referral of this patient. Rudell Cobb, MPT  [place] 07/30/2016, 9:18 PM  [place] [address] [place], [place], [address] Phone: [phone]   Fax:  [phone]  Name: Alejandro Russell MRN: [id number] Date of Birth: 10/16/1927

## 2016-08-11 DIAGNOSIS — I809 Phlebitis and thrombophlebitis of unspecified site: Secondary | ICD-10-CM | POA: Diagnosis not present

## 2016-08-11 DIAGNOSIS — I808 Phlebitis and thrombophlebitis of other sites: Secondary | ICD-10-CM | POA: Diagnosis not present

## 2016-08-17 ENCOUNTER — Encounter: Payer: Self-pay | Admitting: Podiatry

## 2016-08-17 ENCOUNTER — Ambulatory Visit (INDEPENDENT_AMBULATORY_CARE_PROVIDER_SITE_OTHER): Payer: Medicare Other | Admitting: Podiatry

## 2016-08-17 DIAGNOSIS — E538 Deficiency of other specified B group vitamins: Secondary | ICD-10-CM | POA: Diagnosis not present

## 2016-08-17 DIAGNOSIS — B351 Tinea unguium: Secondary | ICD-10-CM | POA: Diagnosis not present

## 2016-08-17 DIAGNOSIS — M79676 Pain in unspecified toe(s): Secondary | ICD-10-CM

## 2016-08-17 NOTE — Progress Notes (Signed)
Patient ID: Alejandro Russell, male   DOB: Aug 19, 1927, 83 y.o.   MRN: [id number] Complaint:  Visit Type: Patient returns to my office for continued preventative foot care services. Complaint: Patient states" my nails have grown long and thick and become painful to walk and wear shoes" Patient has been diagnosed with pre Diabetes.Marland Kitchen He presents for preventative foot care services. No changes to ROS  Podiatric Exam: Vascular: dorsalis pedis and posterior tibial pulses are palpable bilateral. Capillary return is immediate. Temperature gradient is WNL. Skin turgor WNL  Sensorium: Normal Semmes Weinstein monofilament test. Normal tactile sensation bilaterally. Nail Exam: Pt has thick disfigured discolored nails with subungual debris noted bilateral entire nail hallux through fifth toenails Ulcer Exam: There is no evidence of ulcer or pre-ulcerative changes or infection. Orthopedic Exam: Muscle tone and strength are WNL. No limitations in general ROM. No crepitus or effusions noted. Foot type and digits show no abnormalities. Bony prominences are unremarkable. Skin: No Porokeratosis. No infection or ulcers  Diagnosis:  Tinea unguium, Pain in right toe, pain in left toes  Treatment & Plan Procedures and Treatment: Consent by patient was obtained for treatment procedures. The patient understood the discussion of treatment and procedures well. All questions were answered thoroughly reviewed. Debridement of mycotic and hypertrophic toenails, 1 through 5 bilateral and clearing of subungual debris. No ulceration, no infection noted.  Return Visit-Office Procedure: Patient instructed to return to the office for a follow up visit 3 months for continued evaluation and treatment.   Gardiner Barefoot DPM

## 2016-08-20 DIAGNOSIS — D6851 Activated protein C resistance: Secondary | ICD-10-CM | POA: Insufficient documentation

## 2016-09-01 DIAGNOSIS — J189 Pneumonia, unspecified organism: Secondary | ICD-10-CM | POA: Diagnosis not present

## 2016-09-01 DIAGNOSIS — I6200 Nontraumatic subdural hemorrhage, unspecified: Secondary | ICD-10-CM | POA: Diagnosis not present

## 2016-09-01 DIAGNOSIS — Z6828 Body mass index (BMI) 28.0-28.9, adult: Secondary | ICD-10-CM | POA: Diagnosis not present

## 2016-09-01 DIAGNOSIS — E1129 Type 2 diabetes mellitus with other diabetic kidney complication: Secondary | ICD-10-CM | POA: Diagnosis not present

## 2016-09-01 DIAGNOSIS — R5383 Other fatigue: Secondary | ICD-10-CM | POA: Diagnosis not present

## 2016-09-01 DIAGNOSIS — I10 Essential (primary) hypertension: Secondary | ICD-10-CM | POA: Diagnosis not present

## 2016-09-01 DIAGNOSIS — G4733 Obstructive sleep apnea (adult) (pediatric): Secondary | ICD-10-CM | POA: Diagnosis not present

## 2016-09-01 DIAGNOSIS — N183 Chronic kidney disease, stage 3 (moderate): Secondary | ICD-10-CM | POA: Diagnosis not present

## 2016-09-01 DIAGNOSIS — D72829 Elevated white blood cell count, unspecified: Secondary | ICD-10-CM | POA: Diagnosis not present

## 2016-09-04 ENCOUNTER — Other Ambulatory Visit: Payer: Self-pay | Admitting: Internal Medicine

## 2016-09-04 DIAGNOSIS — S065X9A Traumatic subdural hemorrhage with loss of consciousness of unspecified duration, initial encounter: Secondary | ICD-10-CM

## 2016-09-04 DIAGNOSIS — S065XAA Traumatic subdural hemorrhage with loss of consciousness status unknown, initial encounter: Secondary | ICD-10-CM

## 2016-09-04 DIAGNOSIS — R5383 Other fatigue: Secondary | ICD-10-CM

## 2016-09-06 ENCOUNTER — Other Ambulatory Visit: Payer: Self-pay

## 2016-09-06 ENCOUNTER — Ambulatory Visit
Admission: RE | Admit: 2016-09-06 | Discharge: 2016-09-06 | Disposition: A | Discharge status: Home / Self Care | Payer: Medicare Other | Source: Ambulatory Visit | Attending: Internal Medicine | Admitting: Internal Medicine

## 2016-09-06 DIAGNOSIS — D72829 Elevated white blood cell count, unspecified: Secondary | ICD-10-CM | POA: Diagnosis not present

## 2016-09-06 DIAGNOSIS — S065X9A Traumatic subdural hemorrhage with loss of consciousness of unspecified duration, initial encounter: Secondary | ICD-10-CM

## 2016-09-06 DIAGNOSIS — S065XAA Traumatic subdural hemorrhage with loss of consciousness status unknown, initial encounter: Secondary | ICD-10-CM

## 2016-09-06 DIAGNOSIS — R410 Disorientation, unspecified: Secondary | ICD-10-CM | POA: Diagnosis not present

## 2016-09-06 DIAGNOSIS — R5383 Other fatigue: Secondary | ICD-10-CM

## 2016-09-13 DIAGNOSIS — E1140 Type 2 diabetes mellitus with diabetic neuropathy, unspecified: Secondary | ICD-10-CM | POA: Diagnosis not present

## 2016-09-13 DIAGNOSIS — R634 Abnormal weight loss: Secondary | ICD-10-CM | POA: Diagnosis not present

## 2016-09-13 DIAGNOSIS — E1129 Type 2 diabetes mellitus with other diabetic kidney complication: Secondary | ICD-10-CM | POA: Diagnosis not present

## 2016-09-13 DIAGNOSIS — R972 Elevated prostate specific antigen [PSA]: Secondary | ICD-10-CM | POA: Diagnosis not present

## 2016-09-13 DIAGNOSIS — R4189 Other symptoms and signs involving cognitive functions and awareness: Secondary | ICD-10-CM | POA: Diagnosis not present

## 2016-09-20 ENCOUNTER — Other Ambulatory Visit: Payer: Self-pay | Admitting: Internal Medicine

## 2016-09-20 DIAGNOSIS — R634 Abnormal weight loss: Secondary | ICD-10-CM

## 2016-09-20 DIAGNOSIS — R972 Elevated prostate specific antigen [PSA]: Secondary | ICD-10-CM

## 2016-09-21 DIAGNOSIS — R8279 Other abnormal findings on microbiological examination of urine: Secondary | ICD-10-CM | POA: Diagnosis not present

## 2016-09-21 DIAGNOSIS — R972 Elevated prostate specific antigen [PSA]: Secondary | ICD-10-CM | POA: Diagnosis not present

## 2016-09-22 ENCOUNTER — Ambulatory Visit
Admission: RE | Admit: 2016-09-22 | Discharge: 2016-09-22 | Disposition: A | Discharge status: Home / Self Care | Payer: Medicare Other | Source: Ambulatory Visit | Attending: Internal Medicine | Admitting: Internal Medicine

## 2016-09-22 DIAGNOSIS — R918 Other nonspecific abnormal finding of lung field: Secondary | ICD-10-CM | POA: Diagnosis not present

## 2016-09-22 DIAGNOSIS — R972 Elevated prostate specific antigen [PSA]: Secondary | ICD-10-CM

## 2016-09-22 DIAGNOSIS — R634 Abnormal weight loss: Secondary | ICD-10-CM

## 2016-09-22 DIAGNOSIS — N281 Cyst of kidney, acquired: Secondary | ICD-10-CM | POA: Diagnosis not present

## 2016-09-22 MED ORDER — IOPAMIDOL (ISOVUE-300) INJECTION 61%
90.0000 mL | Freq: Once | INTRAVENOUS | Status: AC | PRN
  Administered 2016-09-22: 80 mL via INTRAVENOUS

## 2016-09-26 DIAGNOSIS — R972 Elevated prostate specific antigen [PSA]: Secondary | ICD-10-CM | POA: Diagnosis not present

## 2016-09-26 DIAGNOSIS — E538 Deficiency of other specified B group vitamins: Secondary | ICD-10-CM | POA: Diagnosis not present

## 2016-11-02 DIAGNOSIS — R972 Elevated prostate specific antigen [PSA]: Secondary | ICD-10-CM | POA: Diagnosis not present

## 2016-11-02 DIAGNOSIS — E538 Deficiency of other specified B group vitamins: Secondary | ICD-10-CM | POA: Diagnosis not present

## 2016-11-02 DIAGNOSIS — Z6827 Body mass index (BMI) 27.0-27.9, adult: Secondary | ICD-10-CM | POA: Diagnosis not present

## 2016-11-02 DIAGNOSIS — R634 Abnormal weight loss: Secondary | ICD-10-CM | POA: Diagnosis not present

## 2016-11-06 ENCOUNTER — Other Ambulatory Visit (HOSPITAL_COMMUNITY): Payer: Self-pay | Admitting: Internal Medicine

## 2016-11-10 ENCOUNTER — Encounter: Payer: Self-pay | Admitting: Podiatry

## 2016-11-10 ENCOUNTER — Ambulatory Visit (INDEPENDENT_AMBULATORY_CARE_PROVIDER_SITE_OTHER): Payer: Medicare Other | Admitting: Podiatry

## 2016-11-10 DIAGNOSIS — B351 Tinea unguium: Secondary | ICD-10-CM

## 2016-11-10 DIAGNOSIS — E119 Type 2 diabetes mellitus without complications: Secondary | ICD-10-CM

## 2016-11-10 DIAGNOSIS — M79676 Pain in unspecified toe(s): Secondary | ICD-10-CM

## 2016-11-10 NOTE — Progress Notes (Signed)
Patient ID: Alejandro Russell, male   DOB: 08/02/1927, 83 y.o.   MRN: [id number] Complaint:  Visit Type: Patient returns to my office for continued preventative foot care services. Complaint: Patient states" my nails have grown long and thick and become painful to walk and wear shoes" Patient has been diagnosed with pre Diabetes.. He presents for preventative foot care services. No changes to ROS  Podiatric Exam: Vascular: dorsalis pedis and posterior tibial pulses are palpable bilateral. Capillary return is immediate. Temperature gradient is WNL. Skin turgor WNL  Sensorium: Normal Semmes Weinstein monofilament test. Normal tactile sensation bilaterally. Nail Exam: Pt has thick disfigured discolored nails with subungual debris noted bilateral entire nail hallux through fifth toenails Ulcer Exam: There is no evidence of ulcer or pre-ulcerative changes or infection. Orthopedic Exam: Muscle tone and strength are WNL. No limitations in general ROM. No crepitus or effusions noted. Foot type and digits show no abnormalities. Bony prominences are unremarkable. Skin: No Porokeratosis. No infection or ulcers  Diagnosis:  Tinea unguium, Pain in right toe, pain in left toes  Treatment & Plan Procedures and Treatment: Consent by patient was obtained for treatment procedures. The patient understood the discussion of treatment and procedures well. All questions were answered thoroughly reviewed. Debridement of mycotic and hypertrophic toenails, 1 through 5 bilateral and clearing of subungual debris. No ulceration, no infection noted.  Return Visit-Office Procedure: Patient instructed to return to the office for a follow up visit 3 months for continued evaluation and treatment.   Kineta Fudala DPM 

## 2016-11-13 ENCOUNTER — Encounter (HOSPITAL_COMMUNITY)
Admission: RE | Admit: 2016-11-13 | Discharge: 2016-11-13 | Disposition: A | Discharge status: Home / Self Care | Payer: Medicare Other | Source: Ambulatory Visit | Attending: Internal Medicine | Admitting: Internal Medicine

## 2016-11-13 MED ORDER — TECHNETIUM TC 99M SESTAMIBI - CARDIOLITE
23.5000 | Freq: Once | INTRAVENOUS | Status: AC | PRN
  Administered 2016-11-13: 12:00:00 23.5 via INTRAVENOUS

## 2016-11-17 ENCOUNTER — Other Ambulatory Visit (HOSPITAL_COMMUNITY): Payer: Self-pay | Admitting: Internal Medicine

## 2016-11-17 DIAGNOSIS — Z1389 Encounter for screening for other disorder: Secondary | ICD-10-CM | POA: Diagnosis not present

## 2016-11-17 DIAGNOSIS — R972 Elevated prostate specific antigen [PSA]: Secondary | ICD-10-CM

## 2016-11-17 DIAGNOSIS — R634 Abnormal weight loss: Secondary | ICD-10-CM | POA: Diagnosis not present

## 2016-11-17 DIAGNOSIS — E1129 Type 2 diabetes mellitus with other diabetic kidney complication: Secondary | ICD-10-CM | POA: Diagnosis not present

## 2016-11-22 ENCOUNTER — Encounter (HOSPITAL_COMMUNITY)
Admission: RE | Admit: 2016-11-22 | Discharge: 2016-11-22 | Disposition: A | Discharge status: Home / Self Care | Payer: Medicare Other | Source: Ambulatory Visit | Attending: Internal Medicine | Admitting: Internal Medicine

## 2016-11-22 DIAGNOSIS — R972 Elevated prostate specific antigen [PSA]: Secondary | ICD-10-CM | POA: Diagnosis not present

## 2016-11-22 MED ORDER — TECHNETIUM TC 99M MEDRONATE IV KIT
20.3000 | PACK | Freq: Once | INTRAVENOUS | Status: AC | PRN
  Administered 2016-11-22: 20.3 via INTRAVENOUS

## 2016-11-23 ENCOUNTER — Ambulatory Visit
Admission: RE | Admit: 2016-11-23 | Discharge: 2016-11-23 | Disposition: A | Discharge status: Home / Self Care | Payer: Medicare Other | Source: Ambulatory Visit | Attending: Internal Medicine | Admitting: Internal Medicine

## 2016-11-23 ENCOUNTER — Other Ambulatory Visit: Payer: Self-pay | Admitting: Internal Medicine

## 2016-11-23 DIAGNOSIS — R937 Abnormal findings on diagnostic imaging of other parts of musculoskeletal system: Secondary | ICD-10-CM

## 2016-11-23 DIAGNOSIS — R0781 Pleurodynia: Secondary | ICD-10-CM | POA: Diagnosis not present

## 2016-12-06 DIAGNOSIS — E538 Deficiency of other specified B group vitamins: Secondary | ICD-10-CM | POA: Diagnosis not present

## 2016-12-12 ENCOUNTER — Telehealth: Payer: Self-pay | Admitting: Internal Medicine

## 2016-12-12 DIAGNOSIS — G4733 Obstructive sleep apnea (adult) (pediatric): Secondary | ICD-10-CM

## 2016-12-12 NOTE — Telephone Encounter (Signed)
CY  Please Advise-  Spoke with pt and got the ok to speak with his wife. Spoke with patient's wife who states that patient no longer wishes to continue on cpap. She states when he last saw [facility] prior to establishing with you, Ruth told the patient that if he lost some weight then he may no longer need to be on cpap. Pt's wife states patient has lost 20lbs. She states patient has been off cpap machine for about a month and feels like he is doing well off of it. He is not snoring as much and feels like he sleeps well. They want to know if an order can be placed to d/c his cpap and if he still needed to keep his follow up appt in Oct or can he just follow up as needed.

## 2016-12-13 NOTE — Telephone Encounter (Signed)
Untreated sleep apnea is like untreated high blood pressure. The only way to be sure he no longer needs treatment would be to to an unattended home sleep test off CPAP. He can certainly choose not to use CPAP or one of the other therapies- that is up to him. If he requests, then ok to order dc CPAP and cancel his appointment. We can see him again as needed.

## 2016-12-13 NOTE — Telephone Encounter (Signed)
Spoke with wife and they are willing to try the home sleep test. The order was placed and they had no additional questions at this time. Nothing further is needed

## 2016-12-22 DIAGNOSIS — R972 Elevated prostate specific antigen [PSA]: Secondary | ICD-10-CM | POA: Diagnosis not present

## 2016-12-22 DIAGNOSIS — G4733 Obstructive sleep apnea (adult) (pediatric): Secondary | ICD-10-CM | POA: Diagnosis not present

## 2016-12-26 ENCOUNTER — Other Ambulatory Visit: Payer: Self-pay | Admitting: *Deleted

## 2016-12-26 DIAGNOSIS — G4733 Obstructive sleep apnea (adult) (pediatric): Secondary | ICD-10-CM | POA: Diagnosis not present

## 2017-01-04 DIAGNOSIS — Z13820 Encounter for screening for osteoporosis: Secondary | ICD-10-CM | POA: Diagnosis not present

## 2017-01-10 DIAGNOSIS — E538 Deficiency of other specified B group vitamins: Secondary | ICD-10-CM | POA: Diagnosis not present

## 2017-01-18 DIAGNOSIS — Z23 Encounter for immunization: Secondary | ICD-10-CM | POA: Diagnosis not present

## 2017-02-09 ENCOUNTER — Ambulatory Visit (INDEPENDENT_AMBULATORY_CARE_PROVIDER_SITE_OTHER): Payer: Medicare Other | Admitting: Podiatry

## 2017-02-09 DIAGNOSIS — M79676 Pain in unspecified toe(s): Secondary | ICD-10-CM

## 2017-02-09 DIAGNOSIS — B351 Tinea unguium: Secondary | ICD-10-CM | POA: Diagnosis not present

## 2017-02-09 DIAGNOSIS — E119 Type 2 diabetes mellitus without complications: Secondary | ICD-10-CM

## 2017-02-09 NOTE — Progress Notes (Signed)
Patient ID: NAYEF COLLEGE, male   DOB: 04/25/1928, 83 y.o.   MRN: [id number] Complaint:  Visit Type: Patient returns to my office for continued preventative foot care services. Complaint: Patient states" my nails have grown long and thick and become painful to walk and wear shoes" Patient has been diagnosed with pre Diabetes.Marland Kitchen He presents for preventative foot care services. No changes to ROS  Podiatric Exam: Vascular: dorsalis pedis and posterior tibial pulses are palpable bilateral. Capillary return is immediate. Temperature gradient is WNL. Skin turgor WNL  Sensorium: Normal Semmes Weinstein monofilament test. Normal tactile sensation bilaterally. Nail Exam: Pt has thick disfigured discolored nails with subungual debris noted bilateral entire nail hallux through fifth toenails Ulcer Exam: There is no evidence of ulcer or pre-ulcerative changes or infection. Orthopedic Exam: Muscle tone and strength are WNL. No limitations in general ROM. No crepitus or effusions noted. Foot type and digits show no abnormalities. Bony prominences are unremarkable. Skin: No Porokeratosis. No infection or ulcers  Diagnosis:  Tinea unguium, Pain in right toe, pain in left toes  Treatment & Plan Procedures and Treatment: Consent by patient was obtained for treatment procedures. The patient understood the discussion of treatment and procedures well. All questions were answered thoroughly reviewed. Debridement of mycotic and hypertrophic toenails, 1 through 5 bilateral and clearing of subungual debris. No ulceration, no infection noted.  Return Visit-Office Procedure: Patient instructed to return to the office for a follow up visit 3 months for continued evaluation and treatment.   Gardiner Barefoot DPM

## 2017-02-13 DIAGNOSIS — Z6825 Body mass index (BMI) 25.0-25.9, adult: Secondary | ICD-10-CM | POA: Diagnosis not present

## 2017-02-13 DIAGNOSIS — M859 Disorder of bone density and structure, unspecified: Secondary | ICD-10-CM | POA: Diagnosis not present

## 2017-02-20 DIAGNOSIS — E538 Deficiency of other specified B group vitamins: Secondary | ICD-10-CM | POA: Diagnosis not present

## 2017-03-02 ENCOUNTER — Ambulatory Visit (INDEPENDENT_AMBULATORY_CARE_PROVIDER_SITE_OTHER): Payer: Medicare Other | Admitting: Internal Medicine

## 2017-03-02 ENCOUNTER — Encounter: Payer: Self-pay | Admitting: Internal Medicine

## 2017-03-02 DIAGNOSIS — S065XAA Traumatic subdural hemorrhage with loss of consciousness status unknown, initial encounter: Secondary | ICD-10-CM

## 2017-03-02 DIAGNOSIS — G4733 Obstructive sleep apnea (adult) (pediatric): Secondary | ICD-10-CM | POA: Diagnosis not present

## 2017-03-02 DIAGNOSIS — S065X9A Traumatic subdural hemorrhage with loss of consciousness of unspecified duration, initial encounter: Secondary | ICD-10-CM

## 2017-03-02 NOTE — Patient Instructions (Addendum)
We can continue CPAP 16/ [facility], mask of choice, humidifier, supplies, AirView       Dx OSA  Please call if we can help

## 2017-03-02 NOTE — Assessment & Plan Note (Signed)
He seems frail and a little vague or disconnected, but is responsive to direct questions.

## 2017-03-02 NOTE — Progress Notes (Signed)
Subjective:    Patient ID: Alejandro Russell, male    DOB: [birth year], 83 y.o.   MRN: [id number]  HPI  male former smoker followed for OSA, complicated by DM 2, HBP, DVT, CAD, DM 2, SDH NPSG - 2000, AHI 50/ hr  ---------------------------------------------------------------------------------------------   03/02/2016-83 year old male former smoker followed for OSA, complicated by DM 2, HBP, DVT,CAD, DM 2 FOLLOWS FOR: DME: [facility]. Pt wears CPAP nightly for about 8- 9 hours; mask does not fit as well as it should per wife.  No DL and not in AV.  CPAP 16/ [facility]  Wife here. She goes to different bedroom if his mask slips and he begins to snore, rather than waking him. Got new CPAP machine last year. Uses every night. Still needs occasional nap.  03/02/17- 83 year old male former smoker followed for OSA, complicated by DM 2, HBP, DVT,  CAD/ MI, SDH CPAP 16/ [facility] OSA; DME [facility] Pt wears CPAP nightly-takes off sometimes in the middle of the night. Pt's wife to bring SD Card today for DL.  He had fallen in December, sustaining SDH. Fractured teeth required denture plate. He has been losing weight since then. Wife does not hear him snore or witnessed apneas as she did before CPAP. He wears CPAP all night every night. They both feel he is less sleepy in the daytime using CPAP still. He tried a HST last year but couldn't keep it on and wife considers it not valid.  ROS-see HPI   + = positive Constitutional:   + weight loss, night sweats, fevers, chills, fatigue, lassitude. HEENT:    headaches, difficulty swallowing, tooth/dental problems, sore throat,       sneezing, itching, ear ache, nasal congestion, post nasal drip, snoring CV:    chest pain, orthopnea, PND, swelling in lower extremities, anasarca,                                                       dizziness, palpitations Resp:   shortness of breath with exertion or at rest.                productive cough,   non-productive cough,  coughing up of blood.              change in color of mucus.  wheezing.   Skin:    rash or lesions. GI:  No-   heartburn, indigestion, abdominal pain, nausea, vomiting, GU:  MS:   joint pain, stiffness,  Neuro-     nothing unusual Psych:  change in mood or affect.  depression or anxiety.   memory loss.    Objective:  OBJ- Physical Exam General- Alert, Oriented, Affect-appropriate, Distress- none acute, +Elderly/ vague, not obese Skin- rash-none, lesions- none, excoriation- none Lymphadenopathy- none Head- atraumatic            Eyes- Gross vision intact, PERRLA, conjunctivae and secretions clear            Ears- Hearing, canals-normal            Nose- Clear, no-Septal dev, mucus, polyps, erosion, perforation             Throat- Mallampati III , mucosa clear , drainage- none, tonsils- atrophic, + upper plate,  Neck- flexible , trachea midline, no stridor , thyroid nl, carotid no bruit Chest -  symmetrical excursion , unlabored           Heart/CV- RRR , no murmur , no gallop  , no rub, nl s1 s2                           - JVD- none , edema- none, stasis changes- none, varices- none           Lung- clear to P&A, wheeze- none, cough- none , dullness-none, rub- none           Chest wall-  Abd-  Br/ Gen/ Rectal- Not done, not indicated Extrem- cyanosis- none, clubbing, none, atrophy- none, strength- nl Neuro- grossly intact to observation, but frail      Assessment & Plan:

## 2017-03-02 NOTE — Assessment & Plan Note (Signed)
He has lost weight and might not need CPAP or any longer. However he is comfortable and very compliant with it by wife's description and they both feel satisfied to continue. An attempt at an HST last year to redefine status didn't work because he couldn't keep the monitors on. I suggested they could choose to stop using CPAP and watch to see how he does. At this point they prefer to continue it.

## 2017-03-22 DIAGNOSIS — E538 Deficiency of other specified B group vitamins: Secondary | ICD-10-CM | POA: Diagnosis not present

## 2017-03-23 DIAGNOSIS — I10 Essential (primary) hypertension: Secondary | ICD-10-CM | POA: Diagnosis not present

## 2017-03-23 DIAGNOSIS — E1129 Type 2 diabetes mellitus with other diabetic kidney complication: Secondary | ICD-10-CM | POA: Diagnosis not present

## 2017-03-23 DIAGNOSIS — Z6825 Body mass index (BMI) 25.0-25.9, adult: Secondary | ICD-10-CM | POA: Diagnosis not present

## 2017-03-23 DIAGNOSIS — M8580 Other specified disorders of bone density and structure, unspecified site: Secondary | ICD-10-CM | POA: Diagnosis not present

## 2017-03-23 DIAGNOSIS — E1140 Type 2 diabetes mellitus with diabetic neuropathy, unspecified: Secondary | ICD-10-CM | POA: Diagnosis not present

## 2017-03-28 DIAGNOSIS — R972 Elevated prostate specific antigen [PSA]: Secondary | ICD-10-CM | POA: Diagnosis not present

## 2017-04-02 DIAGNOSIS — R972 Elevated prostate specific antigen [PSA]: Secondary | ICD-10-CM | POA: Diagnosis not present

## 2017-05-09 DIAGNOSIS — E538 Deficiency of other specified B group vitamins: Secondary | ICD-10-CM | POA: Diagnosis not present

## 2017-05-11 ENCOUNTER — Ambulatory Visit: Payer: Medicare Other | Admitting: Podiatry

## 2017-05-15 ENCOUNTER — Ambulatory Visit (INDEPENDENT_AMBULATORY_CARE_PROVIDER_SITE_OTHER): Payer: Medicare Other | Admitting: Podiatry

## 2017-05-15 DIAGNOSIS — M79676 Pain in unspecified toe(s): Secondary | ICD-10-CM

## 2017-05-15 DIAGNOSIS — B351 Tinea unguium: Secondary | ICD-10-CM | POA: Diagnosis not present

## 2017-05-16 NOTE — Progress Notes (Signed)
Subjective: 83 y.o. returns the office today for painful, elongated, thickened toenails which he cannot trim himself. Denies any redness or drainage around the nails. Denies any acute changes since last appointment and no new complaints today. Denies any systemic complaints such as fevers, chills, nausea, vomiting.   PCP: Marton Redwood, MD   Objective: AAO 3, NAD DP/PT pulses palpable, CRT less than 3 seconds Nails hypertrophic, dystrophic, elongated, brittle, discolored 10. There is tenderness overlying the nails 1-5 bilaterally. There is no surrounding erythema or drainage along the nail sites. No open lesions or pre-ulcerative lesions are identified. No other areas of tenderness bilateral lower extremities. No overlying edema, erythema, increased warmth. No pain with calf compression, swelling, warmth, erythema.  Assessment: Patient presents with symptomatic onychomycosis  Plan: -Treatment options including alternatives, risks, complications were discussed -Nails sharply debrided 10 without complication/bleeding. -Discussed daily foot inspection. If there are any changes, to call the office immediately.  -Follow-up in 3 months or sooner if any problems are to arise. In the meantime, encouraged to call the office with any questions, concerns, changes symptoms.  Celesta Gentile, DPM

## 2017-06-06 DIAGNOSIS — M859 Disorder of bone density and structure, unspecified: Secondary | ICD-10-CM | POA: Diagnosis not present

## 2017-06-06 DIAGNOSIS — E1129 Type 2 diabetes mellitus with other diabetic kidney complication: Secondary | ICD-10-CM | POA: Diagnosis not present

## 2017-06-06 DIAGNOSIS — I10 Essential (primary) hypertension: Secondary | ICD-10-CM | POA: Diagnosis not present

## 2017-06-06 DIAGNOSIS — R82998 Other abnormal findings in urine: Secondary | ICD-10-CM | POA: Diagnosis not present

## 2017-06-06 DIAGNOSIS — E7849 Other hyperlipidemia: Secondary | ICD-10-CM | POA: Diagnosis not present

## 2017-06-06 DIAGNOSIS — E538 Deficiency of other specified B group vitamins: Secondary | ICD-10-CM | POA: Diagnosis not present

## 2017-06-12 DIAGNOSIS — G4733 Obstructive sleep apnea (adult) (pediatric): Secondary | ICD-10-CM | POA: Diagnosis not present

## 2017-06-12 DIAGNOSIS — E213 Hyperparathyroidism, unspecified: Secondary | ICD-10-CM | POA: Insufficient documentation

## 2017-06-12 DIAGNOSIS — Z0000 Encounter for general adult medical examination without abnormal findings: Secondary | ICD-10-CM | POA: Diagnosis not present

## 2017-06-12 DIAGNOSIS — N183 Chronic kidney disease, stage 3 (moderate): Secondary | ICD-10-CM | POA: Diagnosis not present

## 2017-06-12 DIAGNOSIS — Z6825 Body mass index (BMI) 25.0-25.9, adult: Secondary | ICD-10-CM | POA: Diagnosis not present

## 2017-06-12 DIAGNOSIS — E1129 Type 2 diabetes mellitus with other diabetic kidney complication: Secondary | ICD-10-CM | POA: Diagnosis not present

## 2017-06-12 DIAGNOSIS — F3341 Major depressive disorder, recurrent, in partial remission: Secondary | ICD-10-CM | POA: Diagnosis not present

## 2017-06-12 DIAGNOSIS — E7849 Other hyperlipidemia: Secondary | ICD-10-CM | POA: Diagnosis not present

## 2017-06-12 DIAGNOSIS — I2510 Atherosclerotic heart disease of native coronary artery without angina pectoris: Secondary | ICD-10-CM | POA: Diagnosis not present

## 2017-06-12 DIAGNOSIS — E538 Deficiency of other specified B group vitamins: Secondary | ICD-10-CM | POA: Diagnosis not present

## 2017-06-12 DIAGNOSIS — M8580 Other specified disorders of bone density and structure, unspecified site: Secondary | ICD-10-CM | POA: Diagnosis not present

## 2017-06-12 DIAGNOSIS — I10 Essential (primary) hypertension: Secondary | ICD-10-CM | POA: Diagnosis not present

## 2017-06-12 DIAGNOSIS — E1140 Type 2 diabetes mellitus with diabetic neuropathy, unspecified: Secondary | ICD-10-CM | POA: Diagnosis not present

## 2017-07-12 DIAGNOSIS — E538 Deficiency of other specified B group vitamins: Secondary | ICD-10-CM | POA: Diagnosis not present

## 2017-08-14 ENCOUNTER — Ambulatory Visit (INDEPENDENT_AMBULATORY_CARE_PROVIDER_SITE_OTHER): Payer: Medicare Other | Admitting: Podiatry

## 2017-08-14 ENCOUNTER — Encounter: Payer: Self-pay | Admitting: Podiatry

## 2017-08-14 DIAGNOSIS — M79676 Pain in unspecified toe(s): Secondary | ICD-10-CM

## 2017-08-14 DIAGNOSIS — B351 Tinea unguium: Secondary | ICD-10-CM | POA: Diagnosis not present

## 2017-08-14 DIAGNOSIS — D689 Coagulation defect, unspecified: Secondary | ICD-10-CM

## 2017-08-14 DIAGNOSIS — E119 Type 2 diabetes mellitus without complications: Secondary | ICD-10-CM

## 2017-08-14 NOTE — Progress Notes (Signed)
Patient ID: KAYEN GRABEL, male   DOB: [birth year], 84 y.o.   MRN: [id number] Complaint:  Visit Type: Patient returns to my office for continued preventative foot care services. Complaint: Patient states" my nails have grown long and thick and become painful to walk and wear shoes" Patient has been diagnosed with pre Diabetes.Marland Kitchen He presents for preventative foot care services. No changes to ROS  Podiatric Exam: Vascular: dorsalis pedis and posterior tibial pulses are palpable bilateral. Capillary return is immediate. Temperature gradient is WNL. Skin turgor WNL  Sensorium: Normal Semmes Weinstein monofilament test. Normal tactile sensation bilaterally. Nail Exam: Pt has thick disfigured discolored nails with subungual debris noted bilateral entire nail hallux through fifth toenails Ulcer Exam: There is no evidence of ulcer or pre-ulcerative changes or infection. Orthopedic Exam: Muscle tone and strength are WNL. No limitations in general ROM. No crepitus or effusions noted. Foot type and digits show no abnormalities. Bony prominences are unremarkable. Skin: No Porokeratosis. No infection or ulcers  Diagnosis:  Tinea unguium, Pain in right toe, pain in left toes  Treatment & Plan Procedures and Treatment: Consent by patient was obtained for treatment procedures. The patient understood the discussion of treatment and procedures well. All questions were answered thoroughly reviewed. Debridement of mycotic and hypertrophic toenails, 1 through 5 bilateral and clearing of subungual debris. No ulceration, no infection noted. ABN signed for 2019. Return Visit-Office Procedure: Patient instructed to return to the office for a follow up visit 3 months for continued evaluation and treatment.   Gardiner Barefoot DPM

## 2017-08-21 DIAGNOSIS — E538 Deficiency of other specified B group vitamins: Secondary | ICD-10-CM | POA: Diagnosis not present

## 2017-09-12 DIAGNOSIS — R509 Fever, unspecified: Secondary | ICD-10-CM | POA: Diagnosis not present

## 2017-09-12 DIAGNOSIS — R531 Weakness: Secondary | ICD-10-CM | POA: Diagnosis not present

## 2017-09-12 DIAGNOSIS — R350 Frequency of micturition: Secondary | ICD-10-CM | POA: Diagnosis not present

## 2017-09-12 DIAGNOSIS — N390 Urinary tract infection, site not specified: Secondary | ICD-10-CM | POA: Diagnosis not present

## 2017-09-12 DIAGNOSIS — Z6825 Body mass index (BMI) 25.0-25.9, adult: Secondary | ICD-10-CM | POA: Diagnosis not present

## 2017-09-25 DIAGNOSIS — E538 Deficiency of other specified B group vitamins: Secondary | ICD-10-CM | POA: Diagnosis not present

## 2017-10-23 DIAGNOSIS — E538 Deficiency of other specified B group vitamins: Secondary | ICD-10-CM | POA: Diagnosis not present

## 2017-11-14 ENCOUNTER — Encounter: Payer: Self-pay | Admitting: Podiatry

## 2017-11-14 ENCOUNTER — Ambulatory Visit (INDEPENDENT_AMBULATORY_CARE_PROVIDER_SITE_OTHER): Payer: Medicare Other | Admitting: Podiatry

## 2017-11-14 DIAGNOSIS — B351 Tinea unguium: Secondary | ICD-10-CM | POA: Diagnosis not present

## 2017-11-14 DIAGNOSIS — D689 Coagulation defect, unspecified: Secondary | ICD-10-CM

## 2017-11-14 DIAGNOSIS — M79676 Pain in unspecified toe(s): Secondary | ICD-10-CM

## 2017-11-14 DIAGNOSIS — E119 Type 2 diabetes mellitus without complications: Secondary | ICD-10-CM

## 2017-11-14 NOTE — Progress Notes (Signed)
Patient ID: LARRON ARMOR, male   DOB: 1927/09/25, 84 y.o.   MRN: [id number] Complaint:  Visit Type: Patient returns to my office for continued preventative foot care services. Complaint: Patient states" my nails have grown long and thick and become painful to walk and wear shoes" Patient has been diagnosed with pre Diabetes.Marland Kitchen He presents for preventative foot care services. No changes to ROS  Podiatric Exam: Vascular: dorsalis pedis and posterior tibial pulses are palpable bilateral. Capillary return is immediate. Temperature gradient is WNL. Skin turgor WNL  Sensorium: Normal Semmes Weinstein monofilament test. Normal tactile sensation bilaterally. Nail Exam: Pt has thick disfigured discolored nails with subungual debris noted bilateral entire nail hallux through fifth toenails Ulcer Exam: There is no evidence of ulcer or pre-ulcerative changes or infection. Orthopedic Exam: Muscle tone and strength are WNL. No limitations in general ROM. No crepitus or effusions noted. Foot type and digits show no abnormalities. Bony prominences are unremarkable. Skin: No Porokeratosis. No infection or ulcers  Diagnosis:  Tinea unguium, Pain in right toe, pain in left toes  Treatment & Plan Procedures and Treatment: Consent by patient was obtained for treatment procedures. The patient understood the discussion of treatment and procedures well. All questions were answered thoroughly reviewed. Debridement of mycotic and hypertrophic toenails, 1 through 5 bilateral and clearing of subungual debris. No ulceration, no infection noted. ABN signed for 2019. Return Visit-Office Procedure: Patient instructed to return to the office for a follow up visit 3 months for continued evaluation and treatment.   Gardiner Barefoot DPM

## 2017-11-21 DIAGNOSIS — E538 Deficiency of other specified B group vitamins: Secondary | ICD-10-CM | POA: Diagnosis not present

## 2017-12-18 DIAGNOSIS — E538 Deficiency of other specified B group vitamins: Secondary | ICD-10-CM | POA: Diagnosis not present

## 2017-12-25 DIAGNOSIS — E538 Deficiency of other specified B group vitamins: Secondary | ICD-10-CM | POA: Diagnosis not present

## 2017-12-25 DIAGNOSIS — E213 Hyperparathyroidism, unspecified: Secondary | ICD-10-CM | POA: Diagnosis not present

## 2017-12-25 DIAGNOSIS — I10 Essential (primary) hypertension: Secondary | ICD-10-CM | POA: Diagnosis not present

## 2017-12-25 DIAGNOSIS — E1140 Type 2 diabetes mellitus with diabetic neuropathy, unspecified: Secondary | ICD-10-CM | POA: Diagnosis not present

## 2017-12-25 DIAGNOSIS — I2510 Atherosclerotic heart disease of native coronary artery without angina pectoris: Secondary | ICD-10-CM | POA: Diagnosis not present

## 2017-12-25 DIAGNOSIS — M8580 Other specified disorders of bone density and structure, unspecified site: Secondary | ICD-10-CM | POA: Diagnosis not present

## 2017-12-25 DIAGNOSIS — N183 Chronic kidney disease, stage 3 (moderate): Secondary | ICD-10-CM | POA: Diagnosis not present

## 2017-12-25 DIAGNOSIS — G4733 Obstructive sleep apnea (adult) (pediatric): Secondary | ICD-10-CM | POA: Diagnosis not present

## 2017-12-25 DIAGNOSIS — E7849 Other hyperlipidemia: Secondary | ICD-10-CM | POA: Diagnosis not present

## 2017-12-25 DIAGNOSIS — R159 Full incontinence of feces: Secondary | ICD-10-CM | POA: Diagnosis not present

## 2017-12-25 DIAGNOSIS — Z6824 Body mass index (BMI) 24.0-24.9, adult: Secondary | ICD-10-CM | POA: Diagnosis not present

## 2017-12-25 DIAGNOSIS — E1129 Type 2 diabetes mellitus with other diabetic kidney complication: Secondary | ICD-10-CM | POA: Diagnosis not present

## 2017-12-31 DIAGNOSIS — E1129 Type 2 diabetes mellitus with other diabetic kidney complication: Secondary | ICD-10-CM | POA: Diagnosis not present

## 2018-01-23 DIAGNOSIS — E538 Deficiency of other specified B group vitamins: Secondary | ICD-10-CM | POA: Diagnosis not present

## 2018-01-29 DIAGNOSIS — N183 Chronic kidney disease, stage 3 (moderate): Secondary | ICD-10-CM | POA: Diagnosis not present

## 2018-01-29 DIAGNOSIS — E1129 Type 2 diabetes mellitus with other diabetic kidney complication: Secondary | ICD-10-CM | POA: Diagnosis not present

## 2018-01-29 DIAGNOSIS — Z86718 Personal history of other venous thrombosis and embolism: Secondary | ICD-10-CM | POA: Diagnosis not present

## 2018-01-29 DIAGNOSIS — Z23 Encounter for immunization: Secondary | ICD-10-CM | POA: Diagnosis not present

## 2018-01-29 DIAGNOSIS — I10 Essential (primary) hypertension: Secondary | ICD-10-CM | POA: Diagnosis not present

## 2018-01-29 DIAGNOSIS — M79609 Pain in unspecified limb: Secondary | ICD-10-CM | POA: Diagnosis not present

## 2018-01-29 DIAGNOSIS — Z6824 Body mass index (BMI) 24.0-24.9, adult: Secondary | ICD-10-CM | POA: Diagnosis not present

## 2018-01-30 ENCOUNTER — Other Ambulatory Visit (HOSPITAL_COMMUNITY): Payer: Self-pay | Admitting: Internal Medicine

## 2018-01-30 ENCOUNTER — Ambulatory Visit (HOSPITAL_COMMUNITY)
Admission: RE | Admit: 2018-01-30 | Discharge: 2018-01-30 | Disposition: A | Discharge status: Home / Self Care | Payer: Medicare Other | Source: Ambulatory Visit | Attending: Family | Admitting: Family

## 2018-01-30 DIAGNOSIS — M79661 Pain in right lower leg: Secondary | ICD-10-CM

## 2018-01-30 DIAGNOSIS — Z86718 Personal history of other venous thrombosis and embolism: Secondary | ICD-10-CM | POA: Insufficient documentation

## 2018-01-30 DIAGNOSIS — R936 Abnormal findings on diagnostic imaging of limbs: Secondary | ICD-10-CM | POA: Diagnosis not present

## 2018-02-01 DIAGNOSIS — M5416 Radiculopathy, lumbar region: Secondary | ICD-10-CM | POA: Diagnosis not present

## 2018-02-01 DIAGNOSIS — Z6823 Body mass index (BMI) 23.0-23.9, adult: Secondary | ICD-10-CM | POA: Diagnosis not present

## 2018-02-01 DIAGNOSIS — I8291 Chronic embolism and thrombosis of unspecified vein: Secondary | ICD-10-CM | POA: Diagnosis not present

## 2018-02-13 ENCOUNTER — Encounter: Payer: Self-pay | Admitting: Podiatry

## 2018-02-13 ENCOUNTER — Ambulatory Visit (INDEPENDENT_AMBULATORY_CARE_PROVIDER_SITE_OTHER): Payer: Medicare Other | Admitting: Podiatry

## 2018-02-13 DIAGNOSIS — E119 Type 2 diabetes mellitus without complications: Secondary | ICD-10-CM

## 2018-02-13 DIAGNOSIS — B351 Tinea unguium: Secondary | ICD-10-CM | POA: Diagnosis not present

## 2018-02-13 DIAGNOSIS — M79676 Pain in unspecified toe(s): Secondary | ICD-10-CM | POA: Diagnosis not present

## 2018-02-13 DIAGNOSIS — D689 Coagulation defect, unspecified: Secondary | ICD-10-CM

## 2018-02-13 NOTE — Progress Notes (Signed)
Patient ID: LAFE CLERK, male   DOB: Feb 03, 1928, 84 y.o.   MRN: [id number] Complaint:  Visit Type: Patient returns to my office for continued preventative foot care services. Complaint: Patient states" my nails have grown long and thick and become painful to walk and wear shoes" Patient has been diagnosed with pre Diabetes.Marland Kitchen He presents for preventative foot care services. No changes to ROS  Podiatric Exam: Vascular: dorsalis pedis and posterior tibial pulses are palpable bilateral. Capillary return is immediate. Temperature gradient is WNL. Skin turgor WNL  Sensorium: Normal Semmes Weinstein monofilament test. Normal tactile sensation bilaterally. Nail Exam: Pt has thick disfigured discolored nails with subungual debris noted bilateral entire nail hallux through fifth toenails Ulcer Exam: There is no evidence of ulcer or pre-ulcerative changes or infection. Orthopedic Exam: Muscle tone and strength are WNL. No limitations in general ROM. No crepitus or effusions noted. Foot type and digits show no abnormalities. Bony prominences are unremarkable. Skin: No Porokeratosis. No infection or ulcers  Diagnosis:  Tinea unguium, Pain in right toe, pain in left toes  Treatment & Plan Procedures and Treatment: Consent by patient was obtained for treatment procedures. The patient understood the discussion of treatment and procedures well. All questions were answered thoroughly reviewed. Debridement of mycotic and hypertrophic toenails, 1 through 5 bilateral and clearing of subungual debris. No ulceration, no infection noted. ABN signed for 2019. Return Visit-Office Procedure: Patient instructed to return to the office for a follow up visit 3 months for continued evaluation and treatment.   Gardiner Barefoot DPM

## 2018-02-22 ENCOUNTER — Ambulatory Visit: Payer: Medicare Other | Attending: Internal Medicine | Admitting: Physical Therapy

## 2018-02-22 ENCOUNTER — Encounter: Payer: Self-pay | Admitting: Physical Therapy

## 2018-02-22 ENCOUNTER — Other Ambulatory Visit: Payer: Self-pay

## 2018-02-22 DIAGNOSIS — M6281 Muscle weakness (generalized): Secondary | ICD-10-CM | POA: Insufficient documentation

## 2018-02-22 DIAGNOSIS — R2689 Other abnormalities of gait and mobility: Secondary | ICD-10-CM | POA: Diagnosis not present

## 2018-02-22 DIAGNOSIS — M5441 Lumbago with sciatica, right side: Secondary | ICD-10-CM | POA: Diagnosis not present

## 2018-02-22 NOTE — Therapy (Signed)
[facility] [address] [place], [place], [address] Phone: [phone]   Fax:  [phone]  Physical Therapy Evaluation  Patient Details  Name: KIMBERLEY DASTRUP MRN: [id number] Date of Birth: 01-08-1928 Referring Provider (PT): Dr. Marton Redwood   Encounter Date: 02/22/2018  PT End of Session - 02/22/18 1058    Visit Number  1    Date for PT Re-Evaluation  04/19/18    Authorization Type  Medicare AARP    PT Start Time  3570    PT Stop Time  1050    PT Time Calculation (min)  35 min    Activity Tolerance  Patient tolerated treatment well    Behavior During Therapy  [facility] for tasks assessed/performed       Past Medical History:  Diagnosis Date  . Anemia   . Anxiety   . CAD (coronary artery disease)   . Chronic renal disease, stage III ([place])   . Depression   . Diabetes mellitus   . Diverticulosis   . Diverticulosis   . DVT (deep venous thrombosis) (HCC)    on coumadin  . Hyperlipidemia   . Hypertension   . Myocardial infarct ([place])   . Nonspecific abnormal finding in stool contents 09/09/2013  . Obesity   . OSA (obstructive sleep apnea)    wears CPAP  . Peripheral neuropathy   . Psoriasis   . Tubulovillous adenoma of colon 1995  . Type 2 diabetes mellitus ([place])   . Vitamin B12 deficiency     Past Surgical History:  Procedure Laterality Date  . CARDIAC CATHETERIZATION     many yrs ago with stent placed  . CHOLECYSTECTOMY  may 16,2011  . COLONOSCOPY WITH PROPOFOL N/A 09/09/2013   Procedure: COLONOSCOPY WITH PROPOFOL;  Surgeon: Ladene Artist, MD;  Location: [facility];  Service: Endoscopy;  Laterality: N/A;  . CORONARY STENT PLACEMENT    . ESOPHAGOGASTRODUODENOSCOPY (EGD) WITH PROPOFOL N/A 09/09/2013   Procedure: ESOPHAGOGASTRODUODENOSCOPY (EGD) WITH PROPOFOL;  Surgeon: Ladene Artist, MD;  [facility];  Service: Endoscopy;  Laterality: N/A;  . INGUINAL HERNIA REPAIR      There were no  vitals filed for this visit.   Subjective Assessment - 02/22/18 1020    Subjective  Patient back pain started the 01/29/2018.  Patient pain started from right buttocks down right leg.     Patient is accompained by:  Family member   wife   Patient Stated Goals  reduce pain    Currently in Pain?  Yes    Pain Score  7    low 5/10   Pain Location  Back    Pain Orientation  Right    Pain Descriptors / Indicators  Discomfort;Nagging    Pain Type  Acute pain    Pain Radiating Towards  runs up and down the  right leg    Pain Onset  More than a month ago    Pain Frequency  Intermittent    Aggravating Factors   sitting, bending over in sitting, standing    Pain Relieving Factors  lays on back, seat of car reclined    Multiple Pain Sites  No         [facility] PT Assessment - 02/22/18 0001      Assessment   Medical Diagnosis  [id number] Back pain, lumbar with radiculopathy    Referring Provider (PT)  Dr. Marton Redwood    Onset Date/Surgical Date  01/29/18    Prior  Therapy  none      Precautions   Precautions  None      Restrictions   Weight Bearing Restrictions  No      Balance Screen   Has the patient fallen in the past 6 months  No    Has the patient had a decrease in activity level because of a fear of falling?   No    Is the patient reluctant to leave their home because of a fear of falling?   No      Home Environment   Living Environment  Private residence    Available Help at Discharge  Family      Prior Function   Level of Independence  Independent      Cognition   Overall Cognitive Status  History of cognitive impairments - at baseline    Memory  Impaired    Memory Impairment  Other (comment)   start of dementia     Observation/Other Assessments   Focus on Therapeutic Outcomes (FOTO)   50% limited; goal is 35% limited      Posture/Postural Control   Posture/Postural Control  Postural limitations    Postural Limitations  Decreased lumbar lordosis      ROM / Strength    AROM / PROM / Strength  AROM;PROM;Strength      AROM   Lumbar Flexion  decreased by 25%   pain   Lumbar Extension  decreased by 50%    Lumbar - Right Side Bend  full    Lumbar - Left Side Bend  decreaed by 50% with pain      Strength   Right Hip Flexion  4-/5    Right Hip Extension  4-/5    Right Hip ABduction  4-/5    Left Hip Flexion  4-/5    Left Hip Extension  4-/5    Left Hip ABduction  4-/5    Right Knee Extension  3/5    Left Knee Extension  3/5      Palpation   Palpation comment  tenderness located in right gluteal and piriformis      Special Tests    Special Tests  Lumbar      Straight Leg Raise   Findings  Positive    Side   Right    Comment  pain      Ambulation/Gait   Gait Pattern  Decreased dorsiflexion - right;Decreased dorsiflexion - left;Decreased stride length      Standardized Balance Assessment   Standardized Balance Assessment  Five Times Sit to Stand    Five times sit to stand comments   18      Timed Up and Go Test   TUG  Normal TUG    Normal TUG (seconds)  18                Objective measurements completed on examination: See above findings.      [place] Adult PT Treatment/Exercise - 02/22/18 0001      Therapeutic Activites    Therapeutic Activities  ADL's    ADL's  no trunk flexion; only flex at hips; how to pick up items with hip and knee flexion      Exercises   Exercises  Lumbar      Lumbar Exercises: Standing   Other Standing Lumbar Exercises  lateral shift with left side of body against the wall centralizing his pain; afterwards  no right leg pain  PT Education - 02/22/18 1058    Education Details  Access Code: [email or web address]     Person(s) Educated  Patient;Spouse    Methods  Explanation;Demonstration;Handout    Comprehension  Verbalized understanding;Returned demonstration       PT Short Term Goals - 02/22/18 1108      PT SHORT TERM GOAL #1   Baseline  --    Time  4    Period  Weeks    Status   New    Target Date  03/22/18      PT SHORT TERM GOAL #2   Title  understand correct body mechanics to decresae strain on lumbar spine    Baseline  --    Time  4    Period  Weeks    Status  New    Target Date  03/22/18      PT SHORT TERM GOAL #3   Title  pain remains in lumbar instead of traveling down the right leg for 2 weeks    Baseline  --    Time  4    Period  Weeks    Status  New    Target Date  03/22/18      PT SHORT TERM GOAL #4   Title  ---    Baseline  ---        PT Long Term Goals - 02/22/18 1110      PT LONG TERM GOAL #1   Title  The patient will be indep with home exercise progression for post d/c activities.    Baseline  ---    Time  8    Period  Weeks    Status  New    Target Date  04/19/18      PT LONG TERM GOAL #2   Title  The patient will improve TUG from 18 seconds to < or equal to 13.5 seconds to demo dec'd risk for falls.    Baseline  ---    Time  8    Period  Weeks    Status  New    Target Date  04/19/18      PT LONG TERM GOAL #3   Title  lumbar pain decreased >/= 75% so he is able to participate in daily activities and return to shopping with his wife    Baseline  ---    Period  Weeks    Status  New    Target Date  04/19/18      PT LONG TERM GOAL #4   Title  5 times sit to stand score </= 14.8 second improving his overall balance    Baseline  ---    Time  8    Period  Weeks    Status  New    Target Date  04/19/18      PT LONG TERM GOAL #5   Title  The patient will verbalize understanding of community exercise classes for transition to long term exercise program.    Baseline  --    Time  8    Period  Weeks    Status  New    Target Date  04/19/18             Plan - 02/22/18 1059    Clinical Impression Statement  Patient is a 84 year old male with lumbar pain that radiates to the right leg. The pain started suddenly on 01/29/2018.  Patient reports his pain is a 5-7/10 intermittently. Pain is worse with  bending over, standing  and sitting.  Lumbar ROM is limited especially wiht extenison and left sidebending causing pain into right leg. Patient has positive straight leg raise on the right at 45 degrees.  Palpable tenderness located in right gluteal and piriformis.  Patient has weakness in bilateral lower extermity. TUG score and sit to stand core is 18 seconds indicating risk of falling.  Patient ambulates with decreased step length and decreased dorsiflexion.  Patients wife was present and anwering majority of questions for her husband.  Patient will benefit from skilled therapy to imrpove balance and reduce back pain to return to prior functional level.     History and Personal Factors relevant to plan of care:  Diabetes; Coronary artery disease; Mild cognitive impairment    Clinical Presentation  Evolving    Clinical Presentation due to:  Patient has to lay down during the day due to his pain.  Patient does not want to leave his home due to pain.     Clinical Decision Making  Moderate    Rehab Potential  Excellent    Clinical Impairments Affecting Rehab Potential  Diabetes; Coronary artery disease; Mild cognitive impairment    PT Frequency  2x / week    PT Duration  8 weeks    PT Treatment/Interventions  Cryotherapy;Electrical Stimulation;Moist Heat;Traction;Balance training;Therapeutic exercise;Therapeutic activities;Neuromuscular re-education;Patient/family education;Manual techniques;Dry needling    PT Next Visit Plan  tips to avoid falls, started with lateral shift and see if ready for extension, neural tension stretch to right LE, balance exercise, modalities for pain relief; body mechanics    Consulted and Agree with Plan of Care  Patient;Family member/caregiver    Family Member Consulted  wife       Patient will benefit from skilled therapeutic intervention in order to improve the following deficits and impairments:  Abnormal gait, Pain, Increased fascial restricitons, Decreased mobility, Increased muscle spasms,  Decreased activity tolerance, Decreased endurance, Decreased range of motion, Decreased strength, Difficulty walking, Decreased balance  Visit Diagnosis: Acute right-sided low back pain with right-sided sciatica - Plan: PT plan of care cert/re-cert  Other abnormalities of gait and mobility - Plan: PT plan of care cert/re-cert  Muscle weakness (generalized) - Plan: PT plan of care cert/re-cert     Problem List Patient Active Problem List   Diagnosis Date Noted  . SDH (subdural hematoma) ([facility]) 05/23/2016  . Fall   . Head injury   . History of DVT of lower extremity   . Lip laceration   . Supratherapeutic INR   . Anticoagulated on Coumadin   . Hyperglycemia   . Iron deficiency anemia, unspecified 09/09/2013  . Nonspecific abnormal finding in stool contents 09/09/2013  . CRI (chronic renal insufficiency) 09/03/2013  . Hx of adenomatous colonic polyps 09/03/2013  . CAD (coronary artery disease) of artery bypass graft 09/03/2013  . Acute thromboembolism of deep veins of lower extremity ([place]) 11/01/2009  . DIABETES MELLITUS, TYPE II 10/29/2009  . HYPERLIPIDEMIA 10/29/2009  . ANXIETY 10/29/2009  . Obstructive sleep apnea 10/29/2009  . HYPERTENSION 10/29/2009  . DIVERTICULOSIS, COLON 10/29/2009    Earlie Counts, PT 02/22/18 11:16 AM   [facility] [address] [place], [place], [address] Phone: [phone]   Fax:  [phone]  Name: SAYED APOSTOL MRN: [id number] Date of Birth: 01/14/1928

## 2018-02-22 NOTE — Patient Instructions (Signed)
Access Code: [email or web address]  URL: [email or web address]  Date: 02/22/2018  Prepared by: Earlie Counts   Exercises  Left Standing Lateral Shift Correction at [place] 1 reps - 1 sets - 1 min hold - 1x daily - 7x weekly ; perform every hour [facility] [address], [place], [place] [address] Phone # [phone] Fax [phone]

## 2018-02-26 ENCOUNTER — Ambulatory Visit: Payer: Medicare Other | Attending: Internal Medicine

## 2018-02-26 DIAGNOSIS — M5441 Lumbago with sciatica, right side: Secondary | ICD-10-CM | POA: Diagnosis not present

## 2018-02-26 DIAGNOSIS — R2689 Other abnormalities of gait and mobility: Secondary | ICD-10-CM | POA: Diagnosis not present

## 2018-02-26 DIAGNOSIS — M6281 Muscle weakness (generalized): Secondary | ICD-10-CM | POA: Insufficient documentation

## 2018-02-26 NOTE — Patient Instructions (Signed)
Access Code: [email or web address]  URL: [email or web address]  Date: 02/26/2018  Prepared by: Sigurd Sos   Exercises  Left Standing Lateral Shift Correction at [place] - 1 reps - 1 sets - 1 min hold - 1x daily - 7x weekly  Seated Long Arc Quad - 10 reps - 1 sets - 5 hold - 3x daily - 7x weekly  Seated March - 10 reps - 1 sets - 3x daily - 7x weekly  Seated Heel Toe Raises - 10 reps - 1 sets - 3x daily - 7x weekly  Seated Heel Raise - 20 reps - 3x daily - 7x weekly  Seated Isometric Hip Adduction with Ball - 10 reps - 2 sets - 3x daily - 7x weekly

## 2018-02-26 NOTE — Therapy (Signed)
[facility] [address] [place], [place], [address] Phone: [phone]   Fax:  [phone]  Physical Therapy Treatment  Patient Details  Name: Alejandro Russell MRN: [id number] Date of Birth: Mar 07, 1928 Referring Provider (PT): Dr. Marton Redwood   Encounter Date: 02/26/2018  PT End of Session - 02/26/18 1011    Visit Number  2    Date for PT Re-Evaluation  04/19/18    Authorization Type  Medicare AARP    PT Start Time  0931    PT Stop Time  1005    PT Time Calculation (min)  34 min    Activity Tolerance  Other (comment);Patient tolerated treatment well   pt needed to leave early for appointment   Behavior During Therapy  [facility] for tasks assessed/performed       Past Medical History:  Diagnosis Date  . Anemia   . Anxiety   . CAD (coronary artery disease)   . Chronic renal disease, stage III ([place])   . Depression   . Diabetes mellitus   . Diverticulosis   . Diverticulosis   . DVT (deep venous thrombosis) (HCC)    on coumadin  . Hyperlipidemia   . Hypertension   . Myocardial infarct ([place])   . Nonspecific abnormal finding in stool contents 09/09/2013  . Obesity   . OSA (obstructive sleep apnea)    wears CPAP  . Peripheral neuropathy   . Psoriasis   . Tubulovillous adenoma of colon 1995  . Type 2 diabetes mellitus ([place])   . Vitamin B12 deficiency     Past Surgical History:  Procedure Laterality Date  . CARDIAC CATHETERIZATION     many yrs ago with stent placed  . CHOLECYSTECTOMY  may 16,2011  . COLONOSCOPY WITH PROPOFOL N/A 09/09/2013   Procedure: COLONOSCOPY WITH PROPOFOL;  Surgeon: Ladene Artist, MD;  [facility]: [facility];  Service: Endoscopy;  Laterality: N/A;  . CORONARY STENT PLACEMENT    . ESOPHAGOGASTRODUODENOSCOPY (EGD) WITH PROPOFOL N/A 09/09/2013   Procedure: ESOPHAGOGASTRODUODENOSCOPY (EGD) WITH PROPOFOL;  Surgeon: Ladene Artist, MD;  [facility];  Service: Endoscopy;   Laterality: N/A;  . INGUINAL HERNIA REPAIR      There were no vitals filed for this visit.  Subjective Assessment - 02/26/18 0932    Subjective  I feel about the same.  Pt's wife requested to leave by 10:05 today to get to another appointment.    Currently in Pain?  Yes    Pain Score  5     Pain Location  Back    Pain Orientation  Right    Pain Descriptors / Indicators  Discomfort    Pain Type  Acute pain    Aggravating Factors   sitting, bending over, standing    Pain Relieving Factors  walking around, lying down                       [facility] Adult PT Treatment/Exercise - 02/26/18 0001      Exercises   Exercises  Lumbar;Knee/Hip      Lumbar Exercises: Stretches   Active Hamstring Stretch  Left;3 reps;20 seconds      Lumbar Exercises: Seated   Other Seated Lumbar Exercises  heel/toe raises x20      Knee/Hip Exercises: Aerobic   Nustep  Level 1x 10 minutes   PT present to monitor- no pain     Knee/Hip Exercises: Seated   Long Arc Quad  Strengthening;Both;2  sets    [facility]  x 20    Marching  Strengthening;Both;2 sets;20 reps             PT Education - 02/26/18 0950    Education Details   Access Code: [email or web address], home TENs unit information, importance of continued exercise    Person(s) Educated  Patient;Spouse    Methods  Explanation;Handout    Comprehension  Verbalized understanding;Returned demonstration       PT Short Term Goals - 02/22/18 1108      PT SHORT TERM GOAL #1   Baseline  --    Time  4    Period  Weeks    Status  New    Target Date  03/22/18      PT SHORT TERM GOAL #2   Title  understand correct body mechanics to decresae strain on lumbar spine    Baseline  --    Time  4    Period  Weeks    Status  New    Target Date  03/22/18      PT SHORT TERM GOAL #3   Title  pain remains in lumbar instead of traveling down the right leg for 2 weeks    Baseline  --    Time  4    Period  Weeks    Status  New    Target Date   03/22/18      PT SHORT TERM GOAL #4   Title  ---    Baseline  ---        PT Long Term Goals - 02/22/18 1110      PT LONG TERM GOAL #1   Title  The patient will be indep with home exercise progression for post d/c activities.    Baseline  ---    Time  8    Period  Weeks    Status  New    Target Date  04/19/18      PT LONG TERM GOAL #2   Title  The patient will improve TUG from 18 seconds to < or equal to 13.5 seconds to demo dec'd risk for falls.    Baseline  ---    Time  8    Period  Weeks    Status  New    Target Date  04/19/18      PT LONG TERM GOAL #3   Title  lumbar pain decreased >/= 75% so he is able to participate in daily activities and return to shopping with his wife    Baseline  ---    Period  Weeks    Status  New    Target Date  04/19/18      PT LONG TERM GOAL #4   Title  5 times sit to stand score </= 14.8 second improving his overall balance    Baseline  ---    Time  8    Period  Weeks    Status  New    Target Date  04/19/18      PT LONG TERM GOAL #5   Title  The patient will verbalize understanding of community exercise classes for transition to long term exercise program.    Baseline  --    Time  8    Period  Weeks    Status  New    Target Date  04/19/18            Plan - 02/26/18 1009    Clinical Impression Statement  Pt with first time follow-up after evaluation.  Pt is independent in initial exercise to address lateral shift.  PT instructed pt in new HEP for LE strength to improve LE strength and balance.  PT also educated regarding home TENs use and importance of returning to exercise at [facility] and being more mobile during the day.  Pt was able to tolerate all exercise in the clinic today without increased pain. Pt required supervision for safety and verbal cues for technique with exercise.  Pt will continue to benefit from skilled PT for pain management, strength, extension and balance exercises.    Rehab Potential   Excellent    Clinical Impairments Affecting Rehab Potential  Diabetes; Coronary artery disease; Mild cognitive impairment    PT Frequency  2x / week    PT Duration  8 weeks    PT Treatment/Interventions  Cryotherapy;Electrical Stimulation;Moist Heat;Traction;Balance training;Therapeutic exercise;Therapeutic activities;Neuromuscular re-education;Patient/family education;Manual techniques;Dry needling    PT Next Visit Plan  tips to avoid falls, try electrical stimulation for pain (limited with time today), neural tension stretch to right LE, balance exercise, body mechanics    PT Home Exercise Plan  Access Code: [email or web address]     Consulted and Agree with Plan of Care  Patient;Family member/caregiver    Family Member Consulted  wife       Patient will benefit from skilled therapeutic intervention in order to improve the following deficits and impairments:  Abnormal gait, Pain, Increased fascial restricitons, Decreased mobility, Increased muscle spasms, Decreased activity tolerance, Decreased endurance, Decreased range of motion, Decreased strength, Difficulty walking, Decreased balance  Visit Diagnosis: Acute right-sided low back pain with right-sided sciatica  Other abnormalities of gait and mobility  Muscle weakness (generalized)     Problem List Patient Active Problem List   Diagnosis Date Noted  . SDH (subdural hematoma) ([place]) 05/23/2016  . Fall   . Head injury   . History of DVT of lower extremity   . Lip laceration   . Supratherapeutic INR   . Anticoagulated on Coumadin   . Hyperglycemia   . Iron deficiency anemia, unspecified 09/09/2013  . Nonspecific abnormal finding in stool contents 09/09/2013  . CRI (chronic renal insufficiency) 09/03/2013  . Hx of adenomatous colonic polyps 09/03/2013  . CAD (coronary artery disease) of artery bypass graft 09/03/2013  . Acute thromboembolism of deep veins of lower extremity ([place]) 11/01/2009  . DIABETES MELLITUS, TYPE II 10/29/2009  .  HYPERLIPIDEMIA 10/29/2009  . ANXIETY 10/29/2009  . Obstructive sleep apnea 10/29/2009  . HYPERTENSION 10/29/2009  . DIVERTICULOSIS, COLON 10/29/2009   Sigurd Sos, PT 02/26/18 10:13 AM  [facility] [address] [place], [place], [address] Phone: [phone]   Fax:  [phone]  Name: Alejandro Russell MRN: [id number] Date of Birth: Sep 07, 1927

## 2018-03-01 ENCOUNTER — Ambulatory Visit: Payer: Medicare Other | Admitting: Physical Therapy

## 2018-03-01 DIAGNOSIS — M5441 Lumbago with sciatica, right side: Secondary | ICD-10-CM | POA: Diagnosis not present

## 2018-03-01 DIAGNOSIS — R2689 Other abnormalities of gait and mobility: Secondary | ICD-10-CM

## 2018-03-01 DIAGNOSIS — M6281 Muscle weakness (generalized): Secondary | ICD-10-CM | POA: Diagnosis not present

## 2018-03-01 NOTE — Patient Instructions (Signed)
Sit to Stand    Sit on edge of chair, feet flat on floor. Stand upright, extending knees fully. Repeat 10 times per set. Do _1-3 sets per session. Do __1-2__ sessions per day.   Leg Extension (Hamstring)   Sit toward front edge of chair, with leg out straight, heel on floor, toes pointing toward body. Keeping back straight, bend forward at hip, until a stretch is felt. Hold 30-60 seconds. Repeat _3__ times. Repeat with other leg. Do __2-3_ sessions per day.   Madelyn Flavors, PT 03/01/18 11:07 AM' [facility] [place], [place], [address] Phone: [phone]   Fax:  [phone]

## 2018-03-01 NOTE — Therapy (Signed)
[facility] [address] [place], [place], [address] Phone: [phone]   Fax:  [phone]  Physical Therapy Treatment  Patient Details  Name: Alejandro Russell MRN: [id number] Date of Birth: 09-24-1927 Referring Provider (PT): Dr. Martha Clan   Encounter Date: 03/01/2018  PT End of Session - 03/01/18 1048    Visit Number  3    Date for PT Re-Evaluation  04/19/18    Authorization Type  Medicare [employer]    PT Start Time  1042    PT Stop Time  1107    PT Time Calculation (min)  25 min    Activity Tolerance  Other (comment);Patient tolerated treatment well    Behavior During Therapy  [facility] for tasks assessed/performed       Past Medical History:  Diagnosis Date  . Anemia   . Anxiety   . CAD (coronary artery disease)   . Chronic renal disease, stage III (HCC)   . Depression   . Diabetes mellitus   . Diverticulosis   . Diverticulosis   . DVT (deep venous thrombosis) (HCC)    on coumadin  . Hyperlipidemia   . Hypertension   . Myocardial infarct (HCC)   . Nonspecific abnormal finding in stool contents 09/09/2013  . Obesity   . OSA (obstructive sleep apnea)    wears CPAP  . Peripheral neuropathy   . Psoriasis   . Tubulovillous adenoma of colon 1995  . Type 2 diabetes mellitus (HCC)   . Vitamin B12 deficiency     Past Surgical History:  Procedure Laterality Date  . CARDIAC CATHETERIZATION     many yrs ago with stent placed  . CHOLECYSTECTOMY  may 16,2011  . COLONOSCOPY WITH PROPOFOL N/A 09/09/2013   Procedure: COLONOSCOPY WITH PROPOFOL;  Surgeon: Meryl Dare, MD;  Location: [facility];  Service: Endoscopy;  Laterality: N/A;  . CORONARY STENT PLACEMENT    . ESOPHAGOGASTRODUODENOSCOPY (EGD) WITH PROPOFOL N/A 09/09/2013   Procedure: ESOPHAGOGASTRODUODENOSCOPY (EGD) WITH PROPOFOL;  Surgeon: Meryl Dare, MD;  Location: [facility];  Service: Endoscopy;  Laterality: N/A;  . INGUINAL HERNIA REPAIR       There were no vitals filed for this visit.  Subjective Assessment - 03/01/18 1048    Subjective  pt 18 min late. He denies pain.    Patient Stated Goals  reduce pain    Currently in Pain?  No/denies                       [facility] Adult PT Treatment/Exercise - 03/01/18 0001      Exercises   Exercises  Lumbar;Knee/Hip      Lumbar Exercises: Standing   Heel Raises  20 reps      Knee/Hip Exercises: Stretches   Active Hamstring Stretch  Right;1 rep;60 seconds   various positions attempted     Knee/Hip Exercises: Aerobic   Nustep  L1 x 6 min      Knee/Hip Exercises: Standing   Hip Abduction  Stengthening;Both;3 sets;10 reps      Knee/Hip Exercises: Seated   Long Arc Quad  Strengthening;Both;2 sets    Ball Squeeze  x 20 5 sec hold    Marching  Strengthening;Both;2 sets;10 reps    Sit to [employer]             PT Education - 03/01/18 1225    Education Details  HEP    Person(s) Educated  Patient;Spouse  Methods  Explanation;Demonstration;Handout    Comprehension  Verbalized understanding;Returned demonstration       PT Short Term Goals - 02/22/18 1108      PT SHORT TERM GOAL #1   Baseline  --    Time  4    Period  Weeks    Status  New    Target Date  03/22/18      PT SHORT TERM GOAL #2   Title  understand correct body mechanics to decresae strain on lumbar spine    Baseline  --    Time  4    Period  Weeks    Status  New    Target Date  03/22/18      PT SHORT TERM GOAL #3   Title  pain remains in lumbar instead of traveling down the right leg for 2 weeks    Baseline  --    Time  4    Period  Weeks    Status  New    Target Date  03/22/18      PT SHORT TERM GOAL #4   Title  ---    Baseline  ---        PT Long Term Goals - 02/22/18 1110      PT LONG TERM GOAL #1   Title  The patient will be indep with home exercise progression for post d/c activities.    Baseline  ---    Time  8    Period  Weeks    Status  New     Target Date  04/19/18      PT LONG TERM GOAL #2   Title  The patient will improve TUG from 18 seconds to < or equal to 13.5 seconds to demo dec'd risk for falls.    Baseline  ---    Time  8    Period  Weeks    Status  New    Target Date  04/19/18      PT LONG TERM GOAL #3   Title  lumbar pain decreased >/= 75% so he is able to participate in daily activities and return to shopping with his wife    Baseline  ---    Period  Weeks    Status  New    Target Date  04/19/18      PT LONG TERM GOAL #4   Title  5 times sit to stand score </= 14.8 second improving his overall balance    Baseline  ---    Time  8    Period  Weeks    Status  New    Target Date  04/19/18      PT LONG TERM GOAL #5   Title  The patient will verbalize understanding of community exercise classes for transition to long term exercise program.    Baseline  --    Time  8    Period  Weeks    Status  New    Target Date  04/19/18            Plan - 03/01/18 1226    Clinical Impression Statement  Pt was 18 min late for appt. He did well with TE but has marked tightness in HS limiting LAQ. HEP progressed.    PT Treatment/Interventions  Cryotherapy;Electrical Stimulation;Moist Heat;Traction;Balance training;Therapeutic exercise;Therapeutic activities;Neuromuscular re-education;Patient/family education;Manual techniques;Dry needling    PT Next Visit Plan  tips to avoid falls, try electrical stimulation for pain (limited with time today), neural tension  stretch to right LE, balance exercise, body mechanics    PT Home Exercise Plan  Access Code: [email or web address] sit to stand and seated HS stretch    Consulted and Agree with Plan of Care  Patient;Family member/caregiver       Patient will benefit from skilled therapeutic intervention in order to improve the following deficits and impairments:  Abnormal gait, Pain, Increased fascial restricitons, Decreased mobility, Increased muscle spasms, Decreased activity tolerance,  Decreased endurance, Decreased range of motion, Decreased strength, Difficulty walking, Decreased balance  Visit Diagnosis: Acute right-sided low back pain with right-sided sciatica  Other abnormalities of gait and mobility  Muscle weakness (generalized)     Problem List Patient Active Problem List   Diagnosis Date Noted  . SDH (subdural hematoma) (HCC) 05/23/2016  . Fall   . Head injury   . History of DVT of lower extremity   . Lip laceration   . Supratherapeutic INR   . Anticoagulated on Coumadin   . Hyperglycemia   . Iron deficiency anemia, unspecified 09/09/2013  . Nonspecific abnormal finding in stool contents 09/09/2013  . CRI (chronic renal insufficiency) 09/03/2013  . Hx of adenomatous colonic polyps 09/03/2013  . CAD (coronary artery disease) of artery bypass graft 09/03/2013  . Acute thromboembolism of deep veins of lower extremity (HCC) 11/01/2009  . DIABETES MELLITUS, TYPE II 10/29/2009  . HYPERLIPIDEMIA 10/29/2009  . ANXIETY 10/29/2009  . Obstructive sleep apnea 10/29/2009  . HYPERTENSION 10/29/2009  . DIVERTICULOSIS, COLON 10/29/2009   Sanye Ledesma PT 03/01/2018, 12:28 PM  [facility] [address] [place], [place], [address] Phone: [phone]   Fax:  [phone]  Name: Alejandro Russell MRN: [id number] Date of Birth: 03-19-1928

## 2018-03-04 ENCOUNTER — Encounter: Payer: Self-pay | Admitting: Internal Medicine

## 2018-03-04 ENCOUNTER — Ambulatory Visit (INDEPENDENT_AMBULATORY_CARE_PROVIDER_SITE_OTHER): Payer: Medicare Other | Admitting: Internal Medicine

## 2018-03-04 VITALS — BP 102/70 | HR 74 | Ht 72.0 in | Wt 171.8 lb

## 2018-03-04 DIAGNOSIS — I82409 Acute embolism and thrombosis of unspecified deep veins of unspecified lower extremity: Secondary | ICD-10-CM | POA: Diagnosis not present

## 2018-03-04 DIAGNOSIS — G4733 Obstructive sleep apnea (adult) (pediatric): Secondary | ICD-10-CM | POA: Diagnosis not present

## 2018-03-04 NOTE — Assessment & Plan Note (Signed)
We discussed compliance and control goals again.  No practical way to keep him from dislodging the mask in his sleep.  Wife is motivated to help him keep using it is much as possible.  We agreed that using it and he was likely better than using it not at all. Plan-education done.  Try changing his AutoSet machine from fixed 16 to auto 10-20

## 2018-03-04 NOTE — Assessment & Plan Note (Signed)
Wife explains that recent leg pain led to ultrasound showing evidence of old thrombus.  Eliquis was started by his provider prophylactically.

## 2018-03-04 NOTE — Patient Instructions (Signed)
Order- DME [facility]   Please change CPAP to autopap 10-20, continue mask of choice, humidifier, supplies, download card  Please call if we can help

## 2018-03-04 NOTE — Progress Notes (Signed)
Subjective:    Patient ID: Alejandro Russell, male    DOB: 1928/05/07, 84 y.o.   MRN: [id number]  HPI  male former smoker followed for OSA, complicated by DM 2, HBP, DVT, CAD, DM 2, SDH NPSG - 2000, AHI 50/ hr  ---------------------------------------------------------------------------------------------   03/02/17- 84 year old male former smoker followed for OSA, complicated by DM 2, HBP, DVT,  CAD/ MI, SDH CPAP 16/ [facility] OSA; DME [facility] Pt wears CPAP nightly-takes off sometimes in the middle of the night. Pt's wife to bring SD Card today for DL.  He had fallen in December, sustaining SDH. Fractured teeth required denture plate. He has been losing weight since then. Wife does not hear him snore or witnessed apneas as she did before CPAP. He wears CPAP all night every night. They both feel he is less sleepy in the daytime using CPAP still. He tried a HST last year but couldn't keep it on and wife considers it not valid.  03/04/2018- 84 year old male former smoker followed for OSA, complicated by DM 2, HBP, DVT,  CAD/ MI, SDH CPAP 16/ [facility]>> change existing machine to auto 10-20 -----Follows for: OSA, having trouble with mask at night, takes his mask off sometimes, for naps he uses his CPAP Here with his wife who says pattern is the same as last year.  He put CPAP on every night with her supervision but as he falls asleep he begins filling with it and takes it off repeatedly in his sleep.  She make sure he wears it during naps in the daytime.  He feels it is comfortable.  He is not sure it is helping him.  His wife feels better that he wears it at least some.  This machine is 82 years old. Download 18% compliance AHI 21.4/hour with large leak.  Download results reviewed with them.  ROS-see HPI   + = positive Constitutional:    weight loss, night sweats, fevers, chills, fatigue, lassitude. HEENT:    headaches, difficulty swallowing, tooth/dental problems, sore throat,       sneezing, itching,  ear ache, nasal congestion, post nasal drip, snoring CV:    chest pain, orthopnea, PND, swelling in lower extremities, anasarca,                                                       dizziness, palpitations Resp:   shortness of breath with exertion or at rest.                productive cough,   non-productive cough, coughing up of blood.              change in color of mucus.  wheezing.   Skin:    rash or lesions. GI:  No-   heartburn, indigestion, abdominal pain, nausea, vomiting, GU:  MS:   joint pain, stiffness,  Neuro-     nothing unusual Psych:  change in mood or affect.  depression or anxiety.   memory loss.    Objective:  OBJ- Physical Exam General- Alert, Oriented, Affect-appropriate, Distress- none acute, +Elderly/ vague, not obese Skin- rash-none, lesions- none, excoriation- none Lymphadenopathy- none Head- atraumatic            Eyes- Gross vision intact, PERRLA, conjunctivae and secretions clear  Ears- Hearing, canals-normal            Nose- Clear, no-Septal dev, mucus, polyps, erosion, perforation             Throat- Mallampati III , mucosa clear , drainage- none, tonsils- atrophic, + upper plate,  Neck- flexible , trachea midline, no stridor , thyroid nl, carotid no bruit Chest - symmetrical excursion , unlabored           Heart/CV- RRR , no murmur , no gallop  , no rub, nl s1 s2                           - JVD- none , edema- none, stasis changes- none, varices- none           Lung- clear to P&A, wheeze- none, cough- none , dullness-none, rub- none           Chest wall-  Abd-  Br/ Gen/ Rectal- Not done, not indicated Extrem- cyanosis- none, clubbing, none, atrophy- none, strength- nl Neuro- grossly intact to observation, but frail      Assessment & Plan:

## 2018-03-05 ENCOUNTER — Ambulatory Visit: Payer: Medicare Other

## 2018-03-05 DIAGNOSIS — M5441 Lumbago with sciatica, right side: Secondary | ICD-10-CM | POA: Diagnosis not present

## 2018-03-05 DIAGNOSIS — R2689 Other abnormalities of gait and mobility: Secondary | ICD-10-CM

## 2018-03-05 DIAGNOSIS — M6281 Muscle weakness (generalized): Secondary | ICD-10-CM

## 2018-03-05 NOTE — Therapy (Signed)
[facility] [address], [place], [place], [address] Phone: [phone]   Fax:  [phone]  Physical Therapy Treatment  Patient Details  Name: Alejandro Russell MRN: [id number] Date of Birth: 27-Sep-1927 Referring Provider (PT): Dr. Marton Redwood   Encounter Date: 03/05/2018  PT End of Session - 03/05/18 1310    Visit Number  4    Date for PT Re-Evaluation  04/19/18    Authorization Type  Medicare AARP    PT Start Time  1227    PT Stop Time  6767    PT Time Calculation (min)  38 min    Activity Tolerance  Other (comment);Patient tolerated treatment well    Behavior During Therapy  [facility] for tasks assessed/performed       Past Medical History:  Diagnosis Date  . Anemia   . Anxiety   . CAD (coronary artery disease)   . Chronic renal disease, stage III ([place])   . Depression   . Diabetes mellitus   . Diverticulosis   . Diverticulosis   . DVT (deep venous thrombosis) (HCC)    on coumadin  . Hyperlipidemia   . Hypertension   . Myocardial infarct ([place])   . Nonspecific abnormal finding in stool contents 09/09/2013  . Obesity   . OSA (obstructive sleep apnea)    wears CPAP  . Peripheral neuropathy   . Psoriasis   . Tubulovillous adenoma of colon 1995  . Type 2 diabetes mellitus ([place])   . Vitamin B12 deficiency     Past Surgical History:  Procedure Laterality Date  . CARDIAC CATHETERIZATION     many yrs ago with stent placed  . CHOLECYSTECTOMY  may 16,2011  . COLONOSCOPY WITH PROPOFOL N/A 09/09/2013   Procedure: COLONOSCOPY WITH PROPOFOL;  Surgeon: Ladene Artist, MD;  [facility]: [facility];  Service: Endoscopy;  Laterality: N/A;  . CORONARY STENT PLACEMENT    . ESOPHAGOGASTRODUODENOSCOPY (EGD) WITH PROPOFOL N/A 09/09/2013   Procedure: ESOPHAGOGASTRODUODENOSCOPY (EGD) WITH PROPOFOL;  Surgeon: Ladene Artist, MD;  Location: [facility];  Service: Endoscopy;  Laterality: N/A;  . INGUINAL HERNIA REPAIR       There were no vitals filed for this visit.  Subjective Assessment - 03/05/18 1236    Subjective  I'm doing pretty well.  I've been getting out in the car with my wife more often.      Patient Stated Goals  reduce pain    Currently in Pain?  Yes    Pain Score  2     Pain Location  Back    Pain Orientation  Right    Pain Descriptors / Indicators  Discomfort    Pain Onset  More than a month ago    Pain Frequency  Intermittent    Aggravating Factors   being active    Pain Relieving Factors  walking around, lying down                       [facility] Adult PT Treatment/Exercise - 03/05/18 0001      Exercises   Exercises  Lumbar;Knee/Hip      Lumbar Exercises: Standing   Heel Raises  20 reps      Lumbar Exercises: Seated   Other Seated Lumbar Exercises  heel/toe raises x20      Knee/Hip Exercises: Standing   Heel Raises  Both;20 reps    Hip Abduction  Stengthening;Both;3 sets;10 reps    Hip Extension  Stengthening;2  sets;10 reps      Knee/Hip Exercises: Seated   Long Arc Quad  Strengthening;Both;2 sets;10 reps    Ball Squeeze  x 20 5 sec hold    Marching  Strengthening;Both;2 sets;10 reps    Hamstring Curl  Strengthening;Both;2 sets;10 reps;Limitations    Hamstring Limitations  green theraband    Abduction/Adduction   Strengthening;Both;2 sets;10 reps    Abd/Adduction Limitations  green band               PT Short Term Goals - 03/05/18 1252      PT SHORT TERM GOAL #1   Title  The patient will be indep with HEP for LE strengthening, stretching and high level balance.    Time  4    Period  Weeks    Status  New      PT SHORT TERM GOAL #3   Title  pain remains in lumbar instead of traveling down the right leg for 2 weeks    Baseline  pain in Rt buttock    Time  4    Period  Weeks    Status  On-going        PT Long Term Goals - 02/22/18 1110      PT LONG TERM GOAL #1   Title  The patient will be indep with home exercise progression for  post d/c activities.    Baseline  ---    Time  8    Period  Weeks    Status  New    Target Date  04/19/18      PT LONG TERM GOAL #2   Title  The patient will improve TUG from 18 seconds to < or equal to 13.5 seconds to demo dec'd risk for falls.    Baseline  ---    Time  8    Period  Weeks    Status  New    Target Date  04/19/18      PT LONG TERM GOAL #3   Title  lumbar pain decreased >/= 75% so he is able to participate in daily activities and return to shopping with his wife    Baseline  ---    Period  Weeks    Status  New    Target Date  04/19/18      PT LONG TERM GOAL #4   Title  5 times sit to stand score </= 14.8 second improving his overall balance    Baseline  ---    Time  8    Period  Weeks    Status  New    Target Date  04/19/18      PT LONG TERM GOAL #5   Title  The patient will verbalize understanding of community exercise classes for transition to long term exercise program.    Baseline  --    Time  8    Period  Weeks    Status  New    Target Date  04/19/18            Plan - 03/05/18 1305    Clinical Impression Statement  Pt reports mild Rt buttock pain today although his wife reports that he was complaining of pain on the way to PT today.  Pt reports that pain no longer travels down his leg and it is in his low back and Rt buttock only now. Pt has significant limitation in strength, flexibility and mobility.  Pt was able to tolerate increased activity in the  clinic today without increased pain.  PT provided stand by assistance for safety with standing activity and monitored for pain and fatigue with exercise.      Clinical Impairments Affecting Rehab Potential  Diabetes; Coronary artery disease; Mild cognitive impairment    PT Frequency  2x / week    PT Duration  8 weeks    PT Treatment/Interventions  Cryotherapy;Electrical Stimulation;Moist Heat;Traction;Balance training;Therapeutic exercise;Therapeutic activities;Neuromuscular  re-education;Patient/family education;Manual techniques;Dry needling    PT Next Visit Plan  body mechanics education and tips to avoid falls, strength and mobility    PT Home Exercise Plan  Access Code: [email or web address] sit to stand and seated HS stretch    Recommended Other Services  initial certification is signed    Consulted and Agree with Plan of Care  Patient;Family member/caregiver    Family Member Consulted  wife       Patient will benefit from skilled therapeutic intervention in order to improve the following deficits and impairments:  Abnormal gait, Pain, Increased fascial restricitons, Decreased mobility, Increased muscle spasms, Decreased activity tolerance, Decreased endurance, Decreased range of motion, Decreased strength, Difficulty walking, Decreased balance  Visit Diagnosis: Acute right-sided low back pain with right-sided sciatica  Other abnormalities of gait and mobility  Muscle weakness (generalized)     Problem List Patient Active Problem List   Diagnosis Date Noted  . SDH (subdural hematoma) ([place]) 05/23/2016  . Fall   . Head injury   . History of DVT of lower extremity   . Lip laceration   . Supratherapeutic INR   . Hyperglycemia   . Iron deficiency anemia, unspecified 09/09/2013  . Nonspecific abnormal finding in stool contents 09/09/2013  . CRI (chronic renal insufficiency) 09/03/2013  . Hx of adenomatous colonic polyps 09/03/2013  . CAD (coronary artery disease) of artery bypass graft 09/03/2013  . Acute thromboembolism of deep veins of lower extremity ([place]) 11/01/2009  . DIABETES MELLITUS, TYPE II 10/29/2009  . HYPERLIPIDEMIA 10/29/2009  . ANXIETY 10/29/2009  . Obstructive sleep apnea 10/29/2009  . HYPERTENSION 10/29/2009  . DIVERTICULOSIS, COLON 10/29/2009    Sigurd Sos, PT 03/05/18 1:11 PM  [facility] [address] [place], [place], [address] Phone: [phone]   Fax:   [phone]  Name: Alejandro Russell MRN: [id number] Date of Birth: [birth year]

## 2018-03-07 ENCOUNTER — Ambulatory Visit: Payer: Medicare Other | Admitting: Physical Therapy

## 2018-03-07 DIAGNOSIS — M6281 Muscle weakness (generalized): Secondary | ICD-10-CM

## 2018-03-07 DIAGNOSIS — R2689 Other abnormalities of gait and mobility: Secondary | ICD-10-CM | POA: Diagnosis not present

## 2018-03-07 DIAGNOSIS — M5441 Lumbago with sciatica, right side: Secondary | ICD-10-CM

## 2018-03-07 NOTE — Therapy (Signed)
[facility] [address] [place], [place], [address] Phone: [phone]   Fax:  [phone]  Physical Therapy Treatment  Patient Details  Name: Alejandro Russell MRN: [id number] Date of Birth: 1928-04-30 Referring Provider (PT): Dr. Martha Clan   Encounter Date: 03/07/2018  PT End of Session - 03/07/18 1020    Visit Number  5    Date for PT Re-Evaluation  04/19/18    Authorization Type  Medicare AARP    PT Start Time  1017    PT Stop Time  1056    PT Time Calculation (min)  39 min    Activity Tolerance  Other (comment);Patient tolerated treatment well    Behavior During Therapy  [facility] for tasks assessed/performed       Past Medical History:  Diagnosis Date  . Anemia   . Anxiety   . CAD (coronary artery disease)   . Chronic renal disease, stage III (HCC)   . Depression   . Diabetes mellitus   . Diverticulosis   . Diverticulosis   . DVT (deep venous thrombosis) (HCC)    on coumadin  . Hyperlipidemia   . Hypertension   . Myocardial infarct (HCC)   . Nonspecific abnormal finding in stool contents 09/09/2013  . Obesity   . OSA (obstructive sleep apnea)    wears CPAP  . Peripheral neuropathy   . Psoriasis   . Tubulovillous adenoma of colon 1995  . Type 2 diabetes mellitus (HCC)   . Vitamin B12 deficiency     Past Surgical History:  Procedure Laterality Date  . CARDIAC CATHETERIZATION     many yrs ago with stent placed  . CHOLECYSTECTOMY  may 16,2011  . COLONOSCOPY WITH PROPOFOL N/A 09/09/2013   Procedure: COLONOSCOPY WITH PROPOFOL;  Surgeon: Meryl Dare, MD;  Location: [facility];  Service: Endoscopy;  Laterality: N/A;  . CORONARY STENT PLACEMENT    . ESOPHAGOGASTRODUODENOSCOPY (EGD) WITH PROPOFOL N/A 09/09/2013   Procedure: ESOPHAGOGASTRODUODENOSCOPY (EGD) WITH PROPOFOL;  Surgeon: Meryl Dare, MD;  [facility];  Service: Endoscopy;  Laterality: N/A;  . INGUINAL HERNIA REPAIR       There were no vitals filed for this visit.  Subjective Assessment - 03/07/18 1022    Subjective  my back hurts a little    Patient Stated Goals  reduce pain    Currently in Pain?  Yes    Pain Score  2     Pain Location  Back    Pain Orientation  Right    Pain Type  Acute pain                       [facility] Adult PT Treatment/Exercise - 03/07/18 0001      Exercises   Exercises  Lumbar;Knee/Hip      Lumbar Exercises: Standing   Other Standing Lumbar Exercises  resisted walking with black band  fwd/bwd x 70 feet each    Other Standing Lumbar Exercises  side stepping x 71ft each way with BUE support      Lumbar Exercises: Seated   Other Seated Lumbar Exercises  diagonals with red weighted ball x 10 ball bil      Knee/Hip Exercises: Aerobic   Nustep  L1 x 6 min      Knee/Hip Exercises: Standing   Heel Raises  Both;20 reps    Hip Abduction  Stengthening;Both;3 sets;10 reps   last set with yellow band   Hip  Extension  Stengthening;10 reps;3 sets   yellow band   Other Standing Knee Exercises  balance on foam with head turns and up/down    Other Standing Knee Exercises  alt toe taps on foam x 10 ea      Knee/Hip Exercises: Seated   Long Arc Quad  Strengthening;Both;1 set;10 reps   5 sec hold   Marching  Strengthening;Both;2 sets;10 reps;Weights    Marching Weights  --   yellow band   Hamstring Curl  Strengthening;Both;2 sets;10 reps;Limitations    Hamstring Limitations  green theraband    Abduction/Adduction   Strengthening;Both;2 sets;10 reps    Sit to Sand  2 sets;10 reps;without UE support   from foam; 2nd set with ball OH              PT Short Term Goals - 03/05/18 1252      PT SHORT TERM GOAL #1   Title  The patient will be indep with HEP for LE strengthening, stretching and high level balance.    Time  4    Period  Weeks    Status  New      PT SHORT TERM GOAL #3   Title  pain remains in lumbar instead of traveling down the right leg  for 2 weeks    Baseline  pain in Rt buttock    Time  4    Period  Weeks    Status  On-going        PT Long Term Goals - 02/22/18 1110      PT LONG TERM GOAL #1   Title  The patient will be indep with home exercise progression for post d/c activities.    Baseline  ---    Time  8    Period  Weeks    Status  New    Target Date  04/19/18      PT LONG TERM GOAL #2   Title  The patient will improve TUG from 18 seconds to < or equal to 13.5 seconds to demo dec'd risk for falls.    Baseline  ---    Time  8    Period  Weeks    Status  New    Target Date  04/19/18      PT LONG TERM GOAL #3   Title  lumbar pain decreased >/= 75% so he is able to participate in daily activities and return to shopping with his wife    Baseline  ---    Period  Weeks    Status  New    Target Date  04/19/18      PT LONG TERM GOAL #4   Title  5 times sit to stand score </= 14.8 second improving his overall balance    Baseline  ---    Time  8    Period  Weeks    Status  New    Target Date  04/19/18      PT LONG TERM GOAL #5   Title  The patient will verbalize understanding of community exercise classes for transition to long term exercise program.    Baseline  --    Time  8    Period  Weeks    Status  New    Target Date  04/19/18            Plan - 03/07/18 1359    Clinical Impression Statement  Pt did very well with TE today, tolerating increased resistance with some  exercises. He did fine with balance on foam.     PT Treatment/Interventions  Cryotherapy;Electrical Stimulation;Moist Heat;Traction;Balance training;Therapeutic exercise;Therapeutic activities;Neuromuscular re-education;Patient/family education;Manual techniques;Dry needling    PT Next Visit Plan  body mechanics education and tips to avoid falls, strength and mobility    PT Home Exercise Plan  Access Code: [email or web address] sit to stand and seated HS stretch    Consulted and Agree with Plan of Care  Patient;Family member/caregiver        Patient will benefit from skilled therapeutic intervention in order to improve the following deficits and impairments:  Abnormal gait, Pain, Increased fascial restricitons, Decreased mobility, Increased muscle spasms, Decreased activity tolerance, Decreased endurance, Decreased range of motion, Decreased strength, Difficulty walking, Decreased balance  Visit Diagnosis: Acute right-sided low back pain with right-sided sciatica  Other abnormalities of gait and mobility  Muscle weakness (generalized)     Problem List Patient Active Problem List   Diagnosis Date Noted  . SDH (subdural hematoma) (HCC) 05/23/2016  . Fall   . Head injury   . History of DVT of lower extremity   . Lip laceration   . Supratherapeutic INR   . Hyperglycemia   . Iron deficiency anemia, unspecified 09/09/2013  . Nonspecific abnormal finding in stool contents 09/09/2013  . CRI (chronic renal insufficiency) 09/03/2013  . Hx of adenomatous colonic polyps 09/03/2013  . CAD (coronary artery disease) of artery bypass graft 09/03/2013  . Acute thromboembolism of deep veins of lower extremity (HCC) 11/01/2009  . DIABETES MELLITUS, TYPE II 10/29/2009  . HYPERLIPIDEMIA 10/29/2009  . ANXIETY 10/29/2009  . Obstructive sleep apnea 10/29/2009  . HYPERTENSION 10/29/2009  . DIVERTICULOSIS, COLON 10/29/2009    Bethene Hankinson PT 03/07/2018, 2:01 PM  [facility] [address] [place], [place], [address] Phone: [phone]   Fax:  [phone]  Name: JHORDY FAZZINO MRN: [id number] Date of Birth: 05-18-1928

## 2018-03-11 ENCOUNTER — Ambulatory Visit: Payer: Medicare Other | Admitting: Physical Therapy

## 2018-03-11 DIAGNOSIS — M5441 Lumbago with sciatica, right side: Secondary | ICD-10-CM

## 2018-03-11 DIAGNOSIS — R2689 Other abnormalities of gait and mobility: Secondary | ICD-10-CM | POA: Diagnosis not present

## 2018-03-11 DIAGNOSIS — M6281 Muscle weakness (generalized): Secondary | ICD-10-CM

## 2018-03-11 NOTE — Patient Instructions (Signed)
Hip Flexion    Standing on one leg, bring other leg up so thigh is parallel to floor. Hold _5___ seconds. Repeat on other leg. Do ____ repetitions, ____ sets.  http://bt.exer.us/40   Copyright  VHI. All rights reserved.    Bridge   DO ON YOUR BED     Lying on back, legs bent 90, feet flat on floor. Press up hips and torso, reaching hands to feet. Hold for __5__ seconds   Alejandro Russell, PT 03/11/18 3:32 PM [facility] [address] [place], [place] [address] Phone # [phone] Fax [phone]

## 2018-03-11 NOTE — Therapy (Signed)
[facility] [address] [place], [place], [address] Phone: [phone]   Fax:  [phone]  Physical Therapy Treatment  Patient Details  Name: Alejandro Russell MRN: [id number] Date of Birth: August 29, 1927 Referring Provider (PT): Dr. Martha Clan   Encounter Date: 03/11/2018  PT End of Session - 03/11/18 1453    Visit Number  6    Date for PT Re-Evaluation  04/19/18    Authorization Type  Medicare AARP    PT Start Time  1453    PT Stop Time  1534    PT Time Calculation (min)  41 min    Activity Tolerance  Other (comment);Patient tolerated treatment well    Behavior During Therapy  [facility] for tasks assessed/performed       Past Medical History:  Diagnosis Date  . Anemia   . Anxiety   . CAD (coronary artery disease)   . Chronic renal disease, stage III (HCC)   . Depression   . Diabetes mellitus   . Diverticulosis   . Diverticulosis   . DVT (deep venous thrombosis) (HCC)    on coumadin  . Hyperlipidemia   . Hypertension   . Myocardial infarct (HCC)   . Nonspecific abnormal finding in stool contents 09/09/2013  . Obesity   . OSA (obstructive sleep apnea)    wears CPAP  . Peripheral neuropathy   . Psoriasis   . Tubulovillous adenoma of colon 1995  . Type 2 diabetes mellitus (HCC)   . Vitamin B12 deficiency     Past Surgical History:  Procedure Laterality Date  . CARDIAC CATHETERIZATION     many yrs ago with stent placed  . CHOLECYSTECTOMY  may 16,2011  . COLONOSCOPY WITH PROPOFOL N/A 09/09/2013   Procedure: COLONOSCOPY WITH PROPOFOL;  Surgeon: Meryl Dare, MD;  [facility];  Service: Endoscopy;  Laterality: N/A;  . CORONARY STENT PLACEMENT    . ESOPHAGOGASTRODUODENOSCOPY (EGD) WITH PROPOFOL N/A 09/09/2013   Procedure: ESOPHAGOGASTRODUODENOSCOPY (EGD) WITH PROPOFOL;  Surgeon: Meryl Dare, MD;  [facility];  Service: Endoscopy;  Laterality: N/A;  . INGUINAL HERNIA REPAIR       There were no vitals filed for this visit.  Subjective Assessment - 03/11/18 1453    Subjective  my back hurts a little    Patient Stated Goals  reduce pain    Currently in Pain?  Yes    Pain Score  5     Pain Location  Back    Pain Orientation  Right    Pain Descriptors / Indicators  Discomfort    Pain Radiating Towards  intermittent into RLE    Pain Onset  More than a month ago    Pain Frequency  Intermittent    Aggravating Factors   activity    Pain Relieving Factors  walking, lying down         [facility] PT Assessment - 03/11/18 0001      Timed Up and Go Test   TUG  Normal TUG    Normal TUG (seconds)  13                   [facility] Adult PT Treatment/Exercise - 03/11/18 0001      Exercises   Exercises  Lumbar;Knee/Hip      Lumbar Exercises: Supine   Bridge  20 reps;5 seconds   with red band     Knee/Hip Exercises: Standing   Hip Flexion  Stengthening;3 sets;10 reps;Knee bent  1 set with yellow band too hard, 2x10 without   Hip Abduction  Stengthening;Both;10 reps;4 sets   2 sets with red   Hip Extension  Stengthening;10 reps;3 sets;Knee straight   yellow band   Forward Step Up  Both;1 set;15 reps;Hand Hold: 2;Step Height: 8"      Knee/Hip Exercises: Seated   Sit to Sand  1 set;15 reps;without UE support   raising weigted red ball OH     Knee/Hip Exercises: Sidelying   Hip ABduction  Strengthening;Right;10 reps    Clams  1x10, then 2x10 with red band; 1x10 with red; yellow band 2x10 on left (red too hard)             PT Education - 03/11/18 1552    Education Details  HEP    Person(s) Educated  Patient    Methods  Demonstration;Handout;Explanation    Comprehension  Verbalized understanding;Returned demonstration       PT Short Term Goals - 03/11/18 1553      PT SHORT TERM GOAL #1   Title  The patient will be indep with HEP for LE strengthening, stretching and high level balance.    Time  4    Period  Weeks    Status  On-going       PT SHORT TERM GOAL #2   Title  understand correct body mechanics to decresae strain on lumbar spine    Time  4    Period  Weeks    Status  On-going        PT Long Term Goals - 03/11/18 1459      PT LONG TERM GOAL #2   Title  The patient will improve TUG from 18 seconds to < or equal to 13.5 seconds to demo dec'd risk for falls.    Baseline  13 sec     Time  8    Period  Weeks    Status  Achieved            Plan - 03/11/18 1554    Clinical Impression Statement  Pt is progresing with goals meeting his TUG goal today. He demonstrates bil weakness with end range hip flexion in standing, left hip ER and bil with bridging.     Clinical Impairments Affecting Rehab Potential  Diabetes; Coronary artery disease; Mild cognitive impairment    PT Frequency  2x / week    PT Duration  8 weeks    PT Treatment/Interventions  Cryotherapy;Electrical Stimulation;Moist Heat;Traction;Balance training;Therapeutic exercise;Therapeutic activities;Neuromuscular re-education;Patient/family education;Manual techniques;Dry needling    PT Next Visit Plan  body mechanics education and tips to avoid falls, continue hip and core strengthening    PT Home Exercise Plan  Access Code: [email or web address] sit to stand and seated HS stretch, bridging and marching       Patient will benefit from skilled therapeutic intervention in order to improve the following deficits and impairments:  Abnormal gait, Pain, Increased fascial restricitons, Decreased mobility, Increased muscle spasms, Decreased activity tolerance, Decreased endurance, Decreased range of motion, Decreased strength, Difficulty walking, Decreased balance  Visit Diagnosis: Acute right-sided low back pain with right-sided sciatica  Other abnormalities of gait and mobility  Muscle weakness (generalized)     Problem List Patient Active Problem List   Diagnosis Date Noted  . SDH (subdural hematoma) (HCC) 05/23/2016  . Fall   . Head injury   . History  of DVT of lower extremity   . Lip laceration   . Supratherapeutic INR   .  Hyperglycemia   . Iron deficiency anemia, unspecified 09/09/2013  . Nonspecific abnormal finding in stool contents 09/09/2013  . CRI (chronic renal insufficiency) 09/03/2013  . Hx of adenomatous colonic polyps 09/03/2013  . CAD (coronary artery disease) of artery bypass graft 09/03/2013  . Acute thromboembolism of deep veins of lower extremity (HCC) 11/01/2009  . DIABETES MELLITUS, TYPE II 10/29/2009  . HYPERLIPIDEMIA 10/29/2009  . ANXIETY 10/29/2009  . Obstructive sleep apnea 10/29/2009  . HYPERTENSION 10/29/2009  . DIVERTICULOSIS, COLON 10/29/2009    Sinai Mahany PT 03/11/2018, 3:56 PM  [facility] [address], [address] [place], [place], [address] Phone: [phone]   Fax:  [phone]  Name: WINTER BICKSLER MRN: [id number] Date of Birth: 08-31-1927

## 2018-03-13 ENCOUNTER — Ambulatory Visit: Payer: Medicare Other

## 2018-03-13 DIAGNOSIS — M5441 Lumbago with sciatica, right side: Secondary | ICD-10-CM

## 2018-03-13 DIAGNOSIS — R2689 Other abnormalities of gait and mobility: Secondary | ICD-10-CM | POA: Diagnosis not present

## 2018-03-13 DIAGNOSIS — M6281 Muscle weakness (generalized): Secondary | ICD-10-CM

## 2018-03-13 NOTE — Patient Instructions (Signed)
   Lifting Principles  Maintain proper posture and head alignment. Slide object as close as possible before lifting. Move obstacles out of the way. Test before lifting; ask for help if too heavy. Tighten stomach muscles without holding breath. Use smooth movements; do not jerk. Use legs to do the work, and pivot with feet. Distribute the work load symmetrically and close to the center of trunk. Push instead of pull whenever possible.   Squat down and hold basket close to stand. Use leg muscles to do the work.    Avoid twisting or bending back. Pivot around using foot movements, and bend at knees if needed when reaching for articles.        Getting Into / Out of Bed   Lower self to lie down on one side by raising legs and lowering head at the same time. Use arms to assist moving without twisting. Bend both knees to roll onto back if desired. To sit up, start from lying on side, and use same move-ments in reverse. Keep trunk aligned with legs.    Shift weight from front foot to back foot as item is lifted off shelf.    When leaning forward to pick object up from floor, extend one leg out behind. Keep back straight. Hold onto a sturdy support with other hand.      Sit upright, head facing forward. Try using a roll to support lower back. Keep shoulders relaxed, and avoid rounded back. Keep hips level with knees. Avoid crossing legs for long periods.     [facility] [address], [address] [place], [place] [address] Phone # [phone] Fax [phone]  

## 2018-03-13 NOTE — Therapy (Signed)
[facility] [address] [place], [place], [address] Phone: [phone]   Fax:  [phone]  Physical Therapy Treatment  Patient Details  Name: Alejandro Russell MRN: [id number] Date of Birth: 03-13-1928 Referring Provider (PT): Dr. Marton Redwood   Encounter Date: 03/13/2018  PT End of Session - 03/13/18 1309    Visit Number  7    Date for PT Re-Evaluation  04/19/18    Authorization Type  Medicare AARP    PT Start Time  1227    PT Stop Time  1308    PT Time Calculation (min)  41 min    Activity Tolerance  Patient tolerated treatment well    Behavior During Therapy  [facility] for tasks assessed/performed       Past Medical History:  Diagnosis Date  . Anemia   . Anxiety   . CAD (coronary artery disease)   . Chronic renal disease, stage III ([place])   . Depression   . Diabetes mellitus   . Diverticulosis   . Diverticulosis   . DVT (deep venous thrombosis) (HCC)    on coumadin  . Hyperlipidemia   . Hypertension   . Myocardial infarct ([place])   . Nonspecific abnormal finding in stool contents 09/09/2013  . Obesity   . OSA (obstructive sleep apnea)    wears CPAP  . Peripheral neuropathy   . Psoriasis   . Tubulovillous adenoma of colon 1995  . Type 2 diabetes mellitus ([place])   . Vitamin B12 deficiency     Past Surgical History:  Procedure Laterality Date  . CARDIAC CATHETERIZATION     many yrs ago with stent placed  . CHOLECYSTECTOMY  may 16,2011  . COLONOSCOPY WITH PROPOFOL N/A 09/09/2013   Procedure: COLONOSCOPY WITH PROPOFOL;  Surgeon: Ladene Artist, MD;  [facility];  Service: Endoscopy;  Laterality: N/A;  . CORONARY STENT PLACEMENT    . ESOPHAGOGASTRODUODENOSCOPY (EGD) WITH PROPOFOL N/A 09/09/2013   Procedure: ESOPHAGOGASTRODUODENOSCOPY (EGD) WITH PROPOFOL;  Surgeon: Ladene Artist, MD;  [facility];  Service: Endoscopy;  Laterality: N/A;  . INGUINAL HERNIA REPAIR      There were no  vitals filed for this visit.  Subjective Assessment - 03/13/18 1233    Subjective  I feel a little bit of pain in my Rt leg.  Overall I feel much better.    Currently in Pain?  No/denies                       [facility] Adult PT Treatment/Exercise - 03/13/18 0001      Exercises   Exercises  Lumbar;Knee/Hip      Lumbar Exercises: Standing   Other Standing Lumbar Exercises  side stepping x 15ft each way with BUE support      Lumbar Exercises: Seated   Other Seated Lumbar Exercises  diagonals with red weighted ball x 10 ball bil      Lumbar Exercises: Supine   Bridge  20 reps;5 seconds   with red band     Knee/Hip Exercises: Aerobic   Nustep  L1 x 10 min   PT present to monitor for pain/fatigue and to discuss progre     Knee/Hip Exercises: Standing   Heel Raises  Both;20 reps    Hip Abduction  Stengthening;Both;10 reps;4 sets;Knee straight    Abduction Limitations  2# added    Hip Extension  Stengthening;10 reps;3 sets;Knee straight    Extension Limitations  2#  Forward Step Up  Both;1 set;15 reps;Hand Hold: 2;Step Height: 8"      Knee/Hip Exercises: Seated   Long Arc Quad  Strengthening;Both;10 reps;2 sets   5 sec hold   Long Arc Quad Weight  2 lbs.    Sit to Sand  without UE support;2 sets;10 reps   raising weigted red ball OH     Knee/Hip Exercises: Supine   Hip Adduction Isometric  Strengthening;Both;20 reps      Knee/Hip Exercises: Sidelying   Clams  2x10 with yellow band             PT Education - 03/13/18 1254    Education Details  body mechanics     Person(s) Educated  Patient    Methods  Explanation;Handout;Demonstration    Comprehension  Verbalized understanding       PT Short Term Goals - 03/13/18 1250      PT SHORT TERM GOAL #2   Title  understand correct body mechanics to decresae strain on lumbar spine    Status  Achieved      PT SHORT TERM GOAL #3   Title  pain remains in lumbar instead of traveling down the right leg for  2 weeks    Baseline  pain in Rt buttock    Time  4    Period  Weeks    Status  On-going        PT Long Term Goals - 03/13/18 1241      PT LONG TERM GOAL #1   Title  The patient will be indep with home exercise progression for post d/c activities.    Period  Weeks    Status  On-going      PT LONG TERM GOAL #2   Title  The patient will improve TUG from 18 seconds to < or equal to 13.5 seconds to demo dec'd risk for falls.    Baseline  13 sec     Status  Achieved      PT LONG TERM GOAL #3   Title  lumbar pain decreased >/= 75% so he is able to participate in daily activities and return to shopping with his wife    Period  Weeks    Status  On-going            Plan - 03/13/18 1249    Clinical Impression Statement  Pt denies any pain at this time.  Pt reports very mild pain that is intermittent in the Rt LE with activity.  Pt is able to tolerate increased activity in the clinic without pain.  TUG was improved this week indicating lower falls risk.  Pt was able to advance to 2# ankle weights for seated and standing exercise.  Pt required close supervision and stand by assistance for safety, to monitor for pain and verbal cues for technique with exercise.  Pt will continue to benefit from skilled PT to address pain, flexibility and strength to improve safety and independence at home and in the community.      Rehab Potential  Excellent    PT Frequency  2x / week    PT Duration  8 weeks    PT Treatment/Interventions  Cryotherapy;Electrical Stimulation;Moist Heat;Traction;Balance training;Therapeutic exercise;Therapeutic activities;Neuromuscular re-education;Patient/family education;Manual techniques;Dry needling    PT Next Visit Plan  Hip strength, flexibility, fall prevention education    PT Home Exercise Plan  Access Code: [email or web address] sit to stand and seated HS stretch, bridging and marching    Consulted and Agree with  Plan of Care  Patient       Patient will benefit from skilled  therapeutic intervention in order to improve the following deficits and impairments:  Abnormal gait, Pain, Increased fascial restricitons, Decreased mobility, Increased muscle spasms, Decreased activity tolerance, Decreased endurance, Decreased range of motion, Decreased strength, Difficulty walking, Decreased balance  Visit Diagnosis: Acute right-sided low back pain with right-sided sciatica  Other abnormalities of gait and mobility  Muscle weakness (generalized)     Problem List Patient Active Problem List   Diagnosis Date Noted  . SDH (subdural hematoma) ([place]) 05/23/2016  . Fall   . Head injury   . History of DVT of lower extremity   . Lip laceration   . Supratherapeutic INR   . Hyperglycemia   . Iron deficiency anemia, unspecified 09/09/2013  . Nonspecific abnormal finding in stool contents 09/09/2013  . CRI (chronic renal insufficiency) 09/03/2013  . Hx of adenomatous colonic polyps 09/03/2013  . CAD (coronary artery disease) of artery bypass graft 09/03/2013  . Acute thromboembolism of deep veins of lower extremity ([place]) 11/01/2009  . DIABETES MELLITUS, TYPE II 10/29/2009  . HYPERLIPIDEMIA 10/29/2009  . ANXIETY 10/29/2009  . Obstructive sleep apnea 10/29/2009  . HYPERTENSION 10/29/2009  . DIVERTICULOSIS, COLON 10/29/2009   Sigurd Sos, PT 03/13/18 1:10 PM  [facility] [address], [place], [place], [address] Phone: [phone]   Fax:  [phone]  Name: Alejandro Russell MRN: [id number] Date of Birth: 1927-10-03

## 2018-03-14 DIAGNOSIS — Z0130 Encounter for examination of blood pressure without abnormal findings: Secondary | ICD-10-CM | POA: Diagnosis not present

## 2018-03-14 DIAGNOSIS — E1169 Type 2 diabetes mellitus with other specified complication: Secondary | ICD-10-CM | POA: Diagnosis not present

## 2018-03-14 DIAGNOSIS — E782 Mixed hyperlipidemia: Secondary | ICD-10-CM | POA: Diagnosis not present

## 2018-03-14 DIAGNOSIS — H6121 Impacted cerumen, right ear: Secondary | ICD-10-CM | POA: Diagnosis not present

## 2018-03-18 ENCOUNTER — Ambulatory Visit: Payer: Medicare Other

## 2018-03-18 DIAGNOSIS — R2689 Other abnormalities of gait and mobility: Secondary | ICD-10-CM

## 2018-03-18 DIAGNOSIS — M5441 Lumbago with sciatica, right side: Secondary | ICD-10-CM

## 2018-03-18 DIAGNOSIS — H6123 Impacted cerumen, bilateral: Secondary | ICD-10-CM | POA: Diagnosis not present

## 2018-03-18 DIAGNOSIS — Z6823 Body mass index (BMI) 23.0-23.9, adult: Secondary | ICD-10-CM | POA: Diagnosis not present

## 2018-03-18 DIAGNOSIS — M6281 Muscle weakness (generalized): Secondary | ICD-10-CM

## 2018-03-18 NOTE — Therapy (Signed)
[facility] [address] [place], [place], [address] Phone: [phone]   Fax:  [phone]  Physical Therapy Treatment  Patient Details  Name: Alejandro Russell MRN: [id number] Date of Birth: 1927-10-02 Referring Provider (PT): Dr. Marton Redwood   Encounter Date: 03/18/2018  PT End of Session - 03/18/18 1055    Visit Number  8    Date for PT Re-Evaluation  04/19/18    Authorization Type  Medicare A[employer]    PT Start Time  1016    PT Stop Time  1056    PT Time Calculation (min)  40 min    Activity Tolerance  Patient tolerated treatment well    Behavior During Therapy  [facility] for tasks assessed/performed       Past Medical History:  Diagnosis Date  . Anemia   . Anxiety   . CAD (coronary artery disease)   . Chronic renal disease, stage III ([place])   . Depression   . Diabetes mellitus   . Diverticulosis   . Diverticulosis   . DVT (deep venous thrombosis) (HCC)    on coumadin  . Hyperlipidemia   . Hypertension   . Myocardial infarct ([place])   . Nonspecific abnormal finding in stool contents 09/09/2013  . Obesity   . OSA (obstructive sleep apnea)    wears CPAP  . Peripheral neuropathy   . Psoriasis   . Tubulovillous adenoma of colon 1995  . Type 2 diabetes mellitus ([place])   . Vitamin B12 deficiency     Past Surgical History:  Procedure Laterality Date  . CARDIAC CATHETERIZATION     many yrs ago with stent placed  . CHOLECYSTECTOMY  may 16,2011  . COLONOSCOPY WITH PROPOFOL N/A 09/09/2013   Procedure: COLONOSCOPY WITH PROPOFOL;  Surgeon: Ladene Artist, MD;  Location: [facility];  Service: Endoscopy;  Laterality: N/A;  . CORONARY STENT PLACEMENT    . ESOPHAGOGASTRODUODENOSCOPY (EGD) WITH PROPOFOL N/A 09/09/2013   Procedure: ESOPHAGOGASTRODUODENOSCOPY (EGD) WITH PROPOFOL;  Surgeon: Ladene Artist, MD;  [facility];  Service: Endoscopy;  Laterality: N/A;  . INGUINAL HERNIA REPAIR      There were no  vitals filed for this visit.  Subjective Assessment - 03/18/18 1021    Subjective  I have been doing my exercises but not as much as I should be.      Currently in Pain?  Yes    Pain Score  2     Pain Location  Back    Pain Orientation  Right    Pain Descriptors / Indicators  Discomfort    Pain Type  Acute pain    Pain Onset  More than a month ago    Pain Frequency  Intermittent    Aggravating Factors   standing/walking    Pain Relieving Factors  pain medication, lying down         [facility] PT Assessment - 03/18/18 0001      Standardized Balance Assessment   Standardized Balance Assessment  Five Times Sit to Stand    Five times sit to stand comments   18.65                   [facility] Adult PT Treatment/Exercise - 03/18/18 0001      Exercises   Exercises  Lumbar;Knee/Hip      Lumbar Exercises: Seated   Other Seated Lumbar Exercises  diagonals with red weighted ball x 10 ball bil  Knee/Hip Exercises: Aerobic   Nustep  L2 x 10 min   PT present to monitor for pain/fatigue and to discuss progre     Knee/Hip Exercises: Standing   Heel Raises  Both;20 reps    Hip Flexion  Stengthening;Both;2 sets;10 reps;Knee bent    Hip Flexion Limitations  2#    Hip Abduction  Stengthening;Both;10 reps;4 sets;Knee straight    Abduction Limitations  2# added    Hip Extension  Stengthening;10 reps;3 sets;Knee straight    Extension Limitations  2#       Knee/Hip Exercises: Seated   Long Arc Quad  Strengthening;Both;10 reps;2 sets   5 sec hold   Long Arc Quad Weight  2 lbs.    Ball Squeeze  x 20 5 sec hold    Marching  Strengthening;Both;2 sets;10 reps;Weights    Marching Weights  2 lbs.      Knee/Hip Exercises: Sidelying   Clams  2x10 with yellow band   seated              PT Short Term Goals - 03/18/18 1023      PT SHORT TERM GOAL #1   Title  The patient will be indep with HEP for LE strengthening, stretching and high level balance.    Status  Achieved       PT SHORT TERM GOAL #2   Title  understand correct body mechanics to decresae strain on lumbar spine    Status  Achieved      PT SHORT TERM GOAL #3   Title  pain remains in lumbar instead of traveling down the right leg for 2 weeks    Baseline  Rt buttock and intermittent in calf    Time  4    Period  Weeks    Status  On-going        PT Long Term Goals - 03/18/18 1023      PT LONG TERM GOAL #2   Title  The patient will improve TUG from 18 seconds to < or equal to 13.5 seconds to demo dec'd risk for falls.    Status  Achieved            Plan - 03/18/18 1028    Clinical Impression Statement  Pt reports 50% overall improvement in Rt LE pain since the start of care.  Pt describes intermittent low level pain in the Rt buttock that extends to calf a times.  Pt with improved TUG last week indicating overall improvement in balance.  No change in sit to stand time although pt is able to do without UE support.   Pt requires stand by assistance for safety with exercise in the clinic today and verbal cues for speed and technique.  Pt will continue to benefit from skilled PT for strength, flexibility and balance to reduce pain and improve safety at home and in the community.      Clinical Impairments Affecting Rehab Potential  Diabetes; Coronary artery disease; Mild cognitive impairment    PT Frequency  2x / week    PT Duration  8 weeks    PT Treatment/Interventions  Cryotherapy;Electrical Stimulation;Moist Heat;Traction;Balance training;Therapeutic exercise;Therapeutic activities;Neuromuscular re-education;Patient/family education;Manual techniques;Dry needling    PT Next Visit Plan  Hip strength, flexibility, balance and gait     PT Home Exercise Plan  Access Code: [email or web address] sit to stand and seated HS stretch, bridging and marching    Consulted and Agree with Plan of Care  Patient    Family Member  Consulted  wife       Patient will benefit from skilled therapeutic intervention in order to  improve the following deficits and impairments:  Abnormal gait, Pain, Increased fascial restricitons, Decreased mobility, Increased muscle spasms, Decreased activity tolerance, Decreased endurance, Decreased range of motion, Decreased strength, Difficulty walking, Decreased balance  Visit Diagnosis: Acute right-sided low back pain with right-sided sciatica  Other abnormalities of gait and mobility  Muscle weakness (generalized)     Problem List Patient Active Problem List   Diagnosis Date Noted  . SDH (subdural hematoma) ([place]) 05/23/2016  . Fall   . Head injury   . History of DVT of lower extremity   . Lip laceration   . Supratherapeutic INR   . Hyperglycemia   . Iron deficiency anemia, unspecified 09/09/2013  . Nonspecific abnormal finding in stool contents 09/09/2013  . CRI (chronic renal insufficiency) 09/03/2013  . Hx of adenomatous colonic polyps 09/03/2013  . CAD (coronary artery disease) of artery bypass graft 09/03/2013  . Acute thromboembolism of deep veins of lower extremity ([place]) 11/01/2009  . DIABETES MELLITUS, TYPE II 10/29/2009  . HYPERLIPIDEMIA 10/29/2009  . ANXIETY 10/29/2009  . Obstructive sleep apnea 10/29/2009  . HYPERTENSION 10/29/2009  . DIVERTICULOSIS, COLON 10/29/2009   Sigurd Sos, PT 03/18/18 10:56 AM  [facility] [address], [place], [place], [address] Phone: [phone]   Fax:  [phone]  Name: OPAL MCKELLIPS MRN: [id number] Date of Birth: 1927-12-26

## 2018-03-19 DIAGNOSIS — E538 Deficiency of other specified B group vitamins: Secondary | ICD-10-CM | POA: Diagnosis not present

## 2018-03-20 ENCOUNTER — Ambulatory Visit: Payer: Medicare Other

## 2018-03-20 ENCOUNTER — Other Ambulatory Visit (HOSPITAL_COMMUNITY): Payer: Self-pay | Admitting: Internal Medicine

## 2018-03-20 DIAGNOSIS — M5441 Lumbago with sciatica, right side: Secondary | ICD-10-CM

## 2018-03-20 DIAGNOSIS — M6281 Muscle weakness (generalized): Secondary | ICD-10-CM

## 2018-03-20 DIAGNOSIS — R2689 Other abnormalities of gait and mobility: Secondary | ICD-10-CM

## 2018-03-20 DIAGNOSIS — M5416 Radiculopathy, lumbar region: Secondary | ICD-10-CM

## 2018-03-20 NOTE — Therapy (Signed)
[facility] [address], [place], [place], [address] Phone: [phone]   Fax:  [phone]  Physical Therapy Treatment  Patient Details  Name: Alejandro Russell MRN: [id number] Date of Birth: 1928/02/11 Referring Provider (PT): Dr. Marton Redwood   Encounter Date: 03/20/2018  PT End of Session - 03/20/18 1053    Visit Number  9    Date for PT Re-Evaluation  04/19/18    Authorization Type  Medicare AARP    PT Start Time  3818    PT Stop Time  1053    PT Time Calculation (min)  39 min    Activity Tolerance  Patient tolerated treatment well    Behavior During Therapy  [facility] for tasks assessed/performed       Past Medical History:  Diagnosis Date  . Anemia   . Anxiety   . CAD (coronary artery disease)   . Chronic renal disease, stage III ([place])   . Depression   . Diabetes mellitus   . Diverticulosis   . Diverticulosis   . DVT (deep venous thrombosis) (HCC)    on coumadin  . Hyperlipidemia   . Hypertension   . Myocardial infarct ([place])   . Nonspecific abnormal finding in stool contents 09/09/2013  . Obesity   . OSA (obstructive sleep apnea)    wears CPAP  . Peripheral neuropathy   . Psoriasis   . Tubulovillous adenoma of colon 1995  . Type 2 diabetes mellitus ([place])   . Vitamin B12 deficiency     Past Surgical History:  Procedure Laterality Date  . CARDIAC CATHETERIZATION     many yrs ago with stent placed  . CHOLECYSTECTOMY  may 16,2011  . COLONOSCOPY WITH PROPOFOL N/A 09/09/2013   Procedure: COLONOSCOPY WITH PROPOFOL;  Surgeon: Ladene Artist, MD;  [facility];  Service: Endoscopy;  Laterality: N/A;  . CORONARY STENT PLACEMENT    . ESOPHAGOGASTRODUODENOSCOPY (EGD) WITH PROPOFOL N/A 09/09/2013   Procedure: ESOPHAGOGASTRODUODENOSCOPY (EGD) WITH PROPOFOL;  Surgeon: Ladene Artist, MD;  [facility];  Service: Endoscopy;  Laterality: N/A;  . INGUINAL HERNIA REPAIR      There were no  vitals filed for this visit.  Subjective Assessment - 03/20/18 1018    Subjective  I'm having a little more pain today.  I am doing my exercises but not like I shoulder be.      Currently in Pain?  Yes    Pain Score  5     Pain Location  Back    Pain Orientation  Right    Pain Descriptors / Indicators  Discomfort                       [facility] Adult PT Treatment/Exercise - 03/20/18 0001      Exercises   Exercises  Lumbar;Knee/Hip      Lumbar Exercises: Seated   Other Seated Lumbar Exercises  diagonals with red weighted ball x 10 ball bil      Knee/Hip Exercises: Aerobic   Nustep  L2 x 10 min   PT present to monitor for pain/fatigue and to discuss progre     Knee/Hip Exercises: Standing   Heel Raises  Both;20 reps    Hip Flexion  Stengthening;Both;2 sets;10 reps;Knee bent    Hip Flexion Limitations  2#    Hip Abduction  Stengthening;Both;10 reps;4 sets;Knee straight    Abduction Limitations  2# added    Hip Extension  Stengthening;10 reps;3  sets;Knee straight    Extension Limitations  2#       Knee/Hip Exercises: Seated   Long Arc Quad  Strengthening;Both;10 reps;2 sets   5 sec hold   Long Arc Quad Weight  2 lbs.    Ball Squeeze  x 20 5 sec hold    Marching  Strengthening;Both;2 sets;10 reps;Weights    Marching Weights  2 lbs.    Sit to Sand  without UE support;2 sets;10 reps               PT Short Term Goals - 03/18/18 1023      PT SHORT TERM GOAL #1   Title  The patient will be indep with HEP for LE strengthening, stretching and high level balance.    Status  Achieved      PT SHORT TERM GOAL #2   Title  understand correct body mechanics to decresae strain on lumbar spine    Status  Achieved      PT SHORT TERM GOAL #3   Title  pain remains in lumbar instead of traveling down the right leg for 2 weeks    Baseline  Rt buttock and intermittent in calf    Time  4    Period  Weeks    Status  On-going        PT Long Term Goals - 03/20/18  1019      PT LONG TERM GOAL #2   Title  The patient will improve TUG from 18 seconds to < or equal to 13.5 seconds to demo dec'd risk for falls.    Baseline  13 sec     Status  Achieved            Plan - 03/20/18 1027    Clinical Impression Statement  Pt with increased Rt LE pain today and rates it as 5/10.  Pt is able to perform all exercises in the clinic without significant limitation due to pain.  Pt with reduced Rt knee extension with long arc quad today. Pt requires tactile cues for technique and stand by assistance for safety.  Pt with improved mobility overall and demonstrates increased ease of movement.  Pt will continue to benefit from skilled PT for strength, flexibility, mobility and pain management as needed.      Clinical Impairments Affecting Rehab Potential  Diabetes; Coronary artery disease; Mild cognitive impairment    PT Frequency  2x / week    PT Duration  8 weeks    PT Treatment/Interventions  Cryotherapy;Electrical Stimulation;Moist Heat;Traction;Balance training;Therapeutic exercise;Therapeutic activities;Neuromuscular re-education;Patient/family education;Manual techniques;Dry needling    PT Next Visit Plan  Hip strength, flexibility, balance and gait     PT Home Exercise Plan  Access Code: [email or web address] sit to stand and seated HS stretch, bridging and marching    Consulted and Agree with Plan of Care  Patient    Family Member Consulted  wife       Patient will benefit from skilled therapeutic intervention in order to improve the following deficits and impairments:  Abnormal gait, Pain, Increased fascial restricitons, Decreased mobility, Increased muscle spasms, Decreased activity tolerance, Decreased endurance, Decreased range of motion, Decreased strength, Difficulty walking, Decreased balance  Visit Diagnosis: Acute right-sided low back pain with right-sided sciatica  Other abnormalities of gait and mobility  Muscle weakness (generalized)     Problem  List Patient Active Problem List   Diagnosis Date Noted  . SDH (subdural hematoma) ([place]) 05/23/2016  . Fall   . Head  injury   . History of DVT of lower extremity   . Lip laceration   . Supratherapeutic INR   . Hyperglycemia   . Iron deficiency anemia, unspecified 09/09/2013  . Nonspecific abnormal finding in stool contents 09/09/2013  . CRI (chronic renal insufficiency) 09/03/2013  . Hx of adenomatous colonic polyps 09/03/2013  . CAD (coronary artery disease) of artery bypass graft 09/03/2013  . Acute thromboembolism of deep veins of lower extremity ([place]) 11/01/2009  . DIABETES MELLITUS, TYPE II 10/29/2009  . HYPERLIPIDEMIA 10/29/2009  . ANXIETY 10/29/2009  . Obstructive sleep apnea 10/29/2009  . HYPERTENSION 10/29/2009  . DIVERTICULOSIS, COLON 10/29/2009   Sigurd Sos, PT 03/20/18 10:54 AM   [facility] [address], [place], [place], [address] Phone: [phone]   Fax:  [phone]  Name: KALANI STHILAIRE MRN: [id number] Date of Birth: 1928-04-29

## 2018-03-22 ENCOUNTER — Ambulatory Visit (HOSPITAL_COMMUNITY)
Admission: RE | Admit: 2018-03-22 | Discharge: 2018-03-22 | Disposition: A | Discharge status: Home / Self Care | Payer: Medicare Other | Source: Ambulatory Visit | Attending: Internal Medicine | Admitting: Internal Medicine

## 2018-03-22 DIAGNOSIS — M48061 Spinal stenosis, lumbar region without neurogenic claudication: Secondary | ICD-10-CM | POA: Insufficient documentation

## 2018-03-22 DIAGNOSIS — M5416 Radiculopathy, lumbar region: Secondary | ICD-10-CM | POA: Diagnosis not present

## 2018-03-22 DIAGNOSIS — M545 Low back pain: Secondary | ICD-10-CM | POA: Diagnosis not present

## 2018-03-26 ENCOUNTER — Ambulatory Visit: Payer: Medicare Other

## 2018-03-26 DIAGNOSIS — M6281 Muscle weakness (generalized): Secondary | ICD-10-CM

## 2018-03-26 DIAGNOSIS — R2689 Other abnormalities of gait and mobility: Secondary | ICD-10-CM | POA: Diagnosis not present

## 2018-03-26 DIAGNOSIS — M5441 Lumbago with sciatica, right side: Secondary | ICD-10-CM | POA: Diagnosis not present

## 2018-03-26 NOTE — Therapy (Signed)
[facility] [address], [place], [place], [address] Phone: [phone]   Fax:  [phone]  Physical Therapy Treatment  Patient Details  Name: Alejandro Russell MRN: [id number] Date of Birth: [birth year] Referring Provider (PT): Dr. Marton Redwood   Encounter Date: 03/26/2018 Progress Note Reporting Period 02/22/18  to 03/26/18  See note below for Objective Data and Assessment of Progress/Goals.      PT End of Session - 03/26/18 1057    Visit Number  10    Date for PT Re-Evaluation  04/19/18    Authorization Type  Medicare AARP    PT Start Time  1021   late   PT Stop Time  1056    PT Time Calculation (min)  35 min    Activity Tolerance  Patient limited by fatigue    Behavior During Therapy  [facility] for tasks assessed/performed       Past Medical History:  Diagnosis Date  . Anemia   . Anxiety   . CAD (coronary artery disease)   . Chronic renal disease, stage III ([place])   . Depression   . Diabetes mellitus   . Diverticulosis   . Diverticulosis   . DVT (deep venous thrombosis) ([facility])    on coumadin  . Hyperlipidemia   . Hypertension   . Myocardial infarct ([place])   . Nonspecific abnormal finding in stool contents 09/09/2013  . Obesity   . OSA (obstructive sleep apnea)    wears CPAP  . Peripheral neuropathy   . Psoriasis   . Tubulovillous adenoma of colon 1995  . Type 2 diabetes mellitus ([place])   . Vitamin B12 deficiency     Past Surgical History:  Procedure Laterality Date  . CARDIAC CATHETERIZATION     many yrs ago with stent placed  . CHOLECYSTECTOMY  may 16,2011  . COLONOSCOPY WITH PROPOFOL N/A 09/09/2013   Procedure: COLONOSCOPY WITH PROPOFOL;  Surgeon: Ladene Artist, MD;  [facility];  Service: Endoscopy;  Laterality: N/A;  . CORONARY STENT PLACEMENT    . ESOPHAGOGASTRODUODENOSCOPY (EGD) WITH PROPOFOL N/A 09/09/2013   Procedure: ESOPHAGOGASTRODUODENOSCOPY (EGD) WITH PROPOFOL;  Surgeon:  Ladene Artist, MD;  [facility];  Service: Endoscopy;  Laterality: N/A;  . INGUINAL HERNIA REPAIR      There were no vitals filed for this visit.  Subjective Assessment - 03/26/18 1023    Subjective  I am so so.  Had an MRI last week but I don't have results yet.      Currently in Pain?  Yes    Pain Score  4     Pain Location  Back    Pain Orientation  Right    Pain Descriptors / Indicators  Aching    Pain Type  Acute pain    Pain Onset  More than a month ago    Pain Frequency  Intermittent    Aggravating Factors   not sure    Pain Relieving Factors  pain medication, lying down         [facility] PT Assessment - 03/26/18 0001      Assessment   Medical Diagnosis  [id number] Back pain, lumbar with radiculopathy      Standardized Balance Assessment   Standardized Balance Assessment  Five Times Sit to Stand    Five times sit to stand comments   17.84   use of arms     Timed Up and Go Test   TUG  Normal TUG  Normal TUG (seconds)  13.89                   [facility] Adult PT Treatment/Exercise - 03/26/18 0001      Lumbar Exercises: Seated   Other Seated Lumbar Exercises  diagonals with red weighted ball x 10 ball bil      Knee/Hip Exercises: Aerobic   Nustep  L2 x 10 min   PT present to monitor for pain/fatigue and to discuss progre     Knee/Hip Exercises: Standing   Heel Raises  Both;20 reps    Hip Flexion  --    Hip Flexion Limitations  --    Hip Abduction  Stengthening;Both;10 reps;4 sets;Knee straight    Abduction Limitations  2# added    Hip Extension  --    Extension Limitations  --      Knee/Hip Exercises: Seated   Long Arc Quad  Strengthening;Both;10 reps;2 sets   5 sec hold   Long Arc Quad Weight  2 lbs.    Ball Squeeze  x 20 5 sec hold    Marching  Strengthening;Both;2 sets;10 reps;Weights    Marching Weights  2 lbs.    Sit to Sand  without UE support;2 sets;10 reps               PT Short Term Goals - 03/18/18 1023      PT  SHORT TERM GOAL #1   Title  The patient will be indep with HEP for LE strengthening, stretching and high level balance.    Status  Achieved      PT SHORT TERM GOAL #2   Title  understand correct body mechanics to decresae strain on lumbar spine    Status  Achieved      PT SHORT TERM GOAL #3   Title  pain remains in lumbar instead of traveling down the right leg for 2 weeks    Baseline  Rt buttock and intermittent in calf    Time  4    Period  Weeks    Status  On-going        PT Long Term Goals - 03/26/18 1025      PT LONG TERM GOAL #1   Title  The patient will be indep with home exercise progression for post d/c activities.    Time  8    Period  Weeks    Status  On-going      PT LONG TERM GOAL #2   Title  The patient will improve TUG from 18 seconds to < or equal to 13.5 seconds to demo dec'd risk for falls.    Baseline  13.85    Time  8    Period  Weeks    Status  On-going      PT LONG TERM GOAL #3   Title  lumbar pain decreased >/= 75% so he is able to participate in daily activities and return to shopping with his wife    Baseline  10%    Time  8    Period  Weeks    Status  On-going      PT LONG TERM GOAL #4   Title  5 times sit to stand score </= 14.8 second improving his overall balance    Baseline  17.84 seconds    Time  8    Period  Weeks    Status  On-going      PT LONG TERM GOAL #5   Title  The patient will verbalize understanding of community exercise classes for transition to long term exercise program.    Baseline  we have talked about group exercise    Time  8    Period  Weeks    Status  On-going            Plan - 03/26/18 1043    Clinical Impression Statement  Pt with continued Rt LE pain and had MRI last week.  No results yet. Pt reported >25% reduction in Rt LE radiculopathy 2 weeks ago and now 10% reduction reported due to increased pain. Pt with 4-5/10 Rt LE pain reported and is not able to determine aggravating factors.  Pt with  improved sit to stand and TUG time indicating lower falls risk.  Pt is able to perform exercises in the clinic without increased pain today and requires minor verbal cues for speed of movement and posture.  Pt will continue to benefit from skilled PT for LE strength, balance, posture, flexibility and pain management as needed.      Rehab Potential  Excellent    Clinical Impairments Affecting Rehab Potential  Diabetes; Coronary artery disease; Mild cognitive impairment    PT Frequency  2x / week    PT Duration  8 weeks    PT Treatment/Interventions  Cryotherapy;Electrical Stimulation;Moist Heat;Traction;Balance training;Therapeutic exercise;Therapeutic activities;Neuromuscular re-education;Patient/family education;Manual techniques;Dry needling    PT Next Visit Plan  Hip strength, flexibility, balance and gait.  See what MRI shows    PT Home Exercise Plan  Access Code: [email or web address] sit to stand and seated HS stretch, bridging and marching    Consulted and Agree with Plan of Care  Patient    Family Member Consulted  wife       Patient will benefit from skilled therapeutic intervention in order to improve the following deficits and impairments:  Abnormal gait, Pain, Increased fascial restricitons, Decreased mobility, Increased muscle spasms, Decreased activity tolerance, Decreased endurance, Decreased range of motion, Decreased strength, Difficulty walking, Decreased balance  Visit Diagnosis: Acute right-sided low back pain with right-sided sciatica  Other abnormalities of gait and mobility  Muscle weakness (generalized)     Problem List Patient Active Problem List   Diagnosis Date Noted  . SDH (subdural hematoma) ([place]) 05/23/2016  . Fall   . Head injury   . History of DVT of lower extremity   . Lip laceration   . Supratherapeutic INR   . Hyperglycemia   . Iron deficiency anemia, unspecified 09/09/2013  . Nonspecific abnormal finding in stool contents 09/09/2013  . CRI (chronic renal  insufficiency) 09/03/2013  . Hx of adenomatous colonic polyps 09/03/2013  . CAD (coronary artery disease) of artery bypass graft 09/03/2013  . Acute thromboembolism of deep veins of lower extremity ([place]) 11/01/2009  . DIABETES MELLITUS, TYPE II 10/29/2009  . HYPERLIPIDEMIA 10/29/2009  . ANXIETY 10/29/2009  . Obstructive sleep apnea 10/29/2009  . HYPERTENSION 10/29/2009  . DIVERTICULOSIS, COLON 10/29/2009   Sigurd Sos, PT 03/26/18 11:00 AM  [facility] [address] [place], [place], [address] Phone: [phone]   Fax:  [phone]  Name: MELROY BOUGHER MRN: [id number] Date of Birth: 19-Jan-1928

## 2018-03-28 ENCOUNTER — Ambulatory Visit: Payer: Medicare Other

## 2018-03-28 DIAGNOSIS — M6281 Muscle weakness (generalized): Secondary | ICD-10-CM | POA: Diagnosis not present

## 2018-03-28 DIAGNOSIS — R2689 Other abnormalities of gait and mobility: Secondary | ICD-10-CM

## 2018-03-28 DIAGNOSIS — M5441 Lumbago with sciatica, right side: Secondary | ICD-10-CM

## 2018-03-28 NOTE — Therapy (Signed)
[facility] [address] [place], [place], [address] Phone: [phone]   Fax:  [phone]  Physical Therapy Treatment  Patient Details  Name: Alejandro Russell MRN: [id number] Date of Birth: 1928-04-06 Referring Provider (PT): Dr. Marton Redwood   Encounter Date: 03/28/2018  PT End of Session - 03/28/18 1049    Visit Number  11    Date for PT Re-Evaluation  04/19/18    Authorization Type  Medicare AARP    PT Start Time  1018    PT Stop Time  1053    PT Time Calculation (min)  35 min    Activity Tolerance  Patient limited by fatigue    Behavior During Therapy  [facility] for tasks assessed/performed       Past Medical History:  Diagnosis Date  . Anemia   . Anxiety   . CAD (coronary artery disease)   . Chronic renal disease, stage III ([place])   . Depression   . Diabetes mellitus   . Diverticulosis   . Diverticulosis   . DVT (deep venous thrombosis) (HCC)    on coumadin  . Hyperlipidemia   . Hypertension   . Myocardial infarct ([place])   . Nonspecific abnormal finding in stool contents 09/09/2013  . Obesity   . OSA (obstructive sleep apnea)    wears CPAP  . Peripheral neuropathy   . Psoriasis   . Tubulovillous adenoma of colon 1995  . Type 2 diabetes mellitus ([place])   . Vitamin B12 deficiency     Past Surgical History:  Procedure Laterality Date  . CARDIAC CATHETERIZATION     many yrs ago with stent placed  . CHOLECYSTECTOMY  may 16,2011  . COLONOSCOPY WITH PROPOFOL N/A 09/09/2013   Procedure: COLONOSCOPY WITH PROPOFOL;  Surgeon: Ladene Artist, MD;  Location: [facility];  Service: Endoscopy;  Laterality: N/A;  . CORONARY STENT PLACEMENT    . ESOPHAGOGASTRODUODENOSCOPY (EGD) WITH PROPOFOL N/A 09/09/2013   Procedure: ESOPHAGOGASTRODUODENOSCOPY (EGD) WITH PROPOFOL;  Surgeon: Ladene Artist, MD;  [facility];  Service: Endoscopy;  Laterality: N/A;  . INGUINAL HERNIA REPAIR      There were no vitals  filed for this visit.  Subjective Assessment - 03/28/18 1029    Subjective  We got MRI results and MD says I have stenosis.  Will see neurosurgeon in 2 weeks.      Currently in Pain?  Yes    Pain Score  3     Pain Location  Back    Pain Orientation  Right    Pain Descriptors / Indicators  Aching                       [facility] Adult PT Treatment/Exercise - 03/28/18 0001      Exercises   Exercises  Lumbar;Knee/Hip      Lumbar Exercises: Seated   Other Seated Lumbar Exercises  diagonals with red weighted ball x 10 ball bil      Knee/Hip Exercises: Aerobic   Nustep  L2 x 10 min   PT present to monitor for pain/fatigue and to discuss progre     Knee/Hip Exercises: Standing   Heel Raises  Both;20 reps    Hip Flexion  Stengthening;Both;2 sets;10 reps;Knee bent    Hip Flexion Limitations  2#    Hip Abduction  Stengthening;Both;10 reps;4 sets;Knee straight    Abduction Limitations  2# added      Knee/Hip Exercises: Seated  Long Arc [employer]  Strengthening;Both;10 reps;2 sets   5 sec hold   Long Arc Quad Weight  2 lbs.    Ball Squeeze  x 20 5 sec hold    Marching  Strengthening;Both;2 sets;10 reps;Weights    Marching Weights  2 lbs.    Sit to Sand  without UE support;2 sets;10 reps               PT Short Term Goals - 03/18/18 1023      PT SHORT TERM GOAL #1   Title  The patient will be indep with HEP for LE strengthening, stretching and high level balance.    Status  Achieved      PT SHORT TERM GOAL #2   Title  understand correct body mechanics to decresae strain on lumbar spine    Status  Achieved      PT SHORT TERM GOAL #3   Title  pain remains in lumbar instead of traveling down the right leg for 2 weeks    Baseline  Rt buttock and intermittent in calf    Time  4    Period  Weeks    Status  On-going        PT Long Term Goals - 03/26/18 1025      PT LONG TERM GOAL #1   Title  The patient will be indep with home exercise progression for post  d/c activities.    Time  8    Period  Weeks    Status  On-going      PT LONG TERM GOAL #2   Title  The patient will improve TUG from 18 seconds to < or equal to 13.5 seconds to demo dec'd risk for falls.    Baseline  13.85    Time  8    Period  Weeks    Status  On-going      PT LONG TERM GOAL #3   Title  lumbar pain decreased >/= 75% so he is able to participate in daily activities and return to shopping with his wife    Baseline  10%    Time  8    Period  Weeks    Status  On-going      PT LONG TERM GOAL #4   Title  5 times sit to stand score </= 14.8 second improving his overall balance    Baseline  17.84 seconds    Time  8    Period  Weeks    Status  On-going      PT LONG TERM GOAL #5   Title  The patient will verbalize understanding of community exercise classes for transition to long term exercise program.    Baseline  we have talked about group exercise    Time  8    Period  Weeks    Status  On-going            Plan - 03/28/18 1033    Clinical Impression Statement  Pt with reduced pain today and reports 3/10 Rt LE pain from buttock to calf.  MRI showed lumbar stenosis and PT educated pt and his wife regarding spinal anatomy and stenosis.  Pt is able to perform all exercises in the clinic for strength, mobility and balance without increased pain.  Pt required stand by assistance  by PT to monitor for symptoms, cueing for technique and safety.  Pt performed sit to stand x 10 reps without UE support and demonstrated good eccentric control.  Pt was encouraged by PT to continue to do exercises at home and importance of lumbar flexion with stenosis.  Pt with chronic balance, strength and endurance deficits and recent onset of Rt LE radiculopathy and will continue to benefit from skilled PT to address.      Rehab Potential  Excellent    Clinical Impairments Affecting Rehab Potential  Diabetes; Coronary artery disease; Mild cognitive impairment    PT Frequency  2x / week     PT Duration  8 weeks    PT Treatment/Interventions  Cryotherapy;Electrical Stimulation;Moist Heat;Traction;Balance training;Therapeutic exercise;Therapeutic activities;Neuromuscular re-education;Patient/family education;Manual techniques;Dry needling    PT Next Visit Plan  Hip strength, flexibility, balance and gait.      PT Home Exercise Plan  Access Code: [email or web address] sit to stand and seated HS stretch, bridging and marching    Consulted and Agree with Plan of Care  Patient    Family Member Consulted  wife       Patient will benefit from skilled therapeutic intervention in order to improve the following deficits and impairments:  Abnormal gait, Pain, Increased fascial restricitons, Decreased mobility, Increased muscle spasms, Decreased activity tolerance, Decreased endurance, Decreased range of motion, Decreased strength, Difficulty walking, Decreased balance  Visit Diagnosis: Acute right-sided low back pain with right-sided sciatica  Other abnormalities of gait and mobility  Muscle weakness (generalized)     Problem List Patient Active Problem List   Diagnosis Date Noted  . SDH (subdural hematoma) ([place]) 05/23/2016  . Fall   . Head injury   . History of DVT of lower extremity   . Lip laceration   . Supratherapeutic INR   . Hyperglycemia   . Iron deficiency anemia, unspecified 09/09/2013  . Nonspecific abnormal finding in stool contents 09/09/2013  . CRI (chronic renal insufficiency) 09/03/2013  . Hx of adenomatous colonic polyps 09/03/2013  . CAD (coronary artery disease) of artery bypass graft 09/03/2013  . Acute thromboembolism of deep veins of lower extremity ([place]) 11/01/2009  . DIABETES MELLITUS, TYPE II 10/29/2009  . HYPERLIPIDEMIA 10/29/2009  . ANXIETY 10/29/2009  . Obstructive sleep apnea 10/29/2009  . HYPERTENSION 10/29/2009  . DIVERTICULOSIS, COLON 10/29/2009   Sigurd Sos, PT 03/28/18 10:57 AM   [facility] [address], [place], [place], [address] Phone: [phone]   Fax:  [phone]  Name: ORLANDER NORWOOD MRN: [id number] Date of Birth: 04-09-1928

## 2018-04-02 ENCOUNTER — Ambulatory Visit: Payer: Medicare Other | Attending: Internal Medicine | Admitting: Physical Therapy

## 2018-04-02 ENCOUNTER — Encounter: Payer: Self-pay | Admitting: Physical Therapy

## 2018-04-02 DIAGNOSIS — M6281 Muscle weakness (generalized): Secondary | ICD-10-CM | POA: Diagnosis not present

## 2018-04-02 DIAGNOSIS — R2689 Other abnormalities of gait and mobility: Secondary | ICD-10-CM

## 2018-04-02 DIAGNOSIS — M5441 Lumbago with sciatica, right side: Secondary | ICD-10-CM | POA: Diagnosis not present

## 2018-04-02 NOTE — Therapy (Signed)
[facility] [address] [place], [place], [address] Phone: [phone]   Fax:  [phone]  Physical Therapy Treatment  Patient Details  Name: Alejandro Russell MRN: [id number] Date of Birth: 11-08-1927 Referring Provider (PT): Dr. Marton Redwood   Encounter Date: 04/02/2018  PT End of Session - 04/02/18 1012    Visit Number  12    Date for PT Re-Evaluation  04/19/18    Authorization Type  Medicare AARP    PT Start Time  0930    PT Stop Time  1012    PT Time Calculation (min)  42 min    Activity Tolerance  Patient tolerated treatment well    Behavior During Therapy  [facility] for tasks assessed/performed       Past Medical History:  Diagnosis Date  . Anemia   . Anxiety   . CAD (coronary artery disease)   . Chronic renal disease, stage III ([place])   . Depression   . Diabetes mellitus   . Diverticulosis   . Diverticulosis   . DVT (deep venous thrombosis) (HCC)    on coumadin  . Hyperlipidemia   . Hypertension   . Myocardial infarct ([place])   . Nonspecific abnormal finding in stool contents 09/09/2013  . Obesity   . OSA (obstructive sleep apnea)    wears CPAP  . Peripheral neuropathy   . Psoriasis   . Tubulovillous adenoma of colon 1995  . Type 2 diabetes mellitus ([place])   . Vitamin B12 deficiency     Past Surgical History:  Procedure Laterality Date  . CARDIAC CATHETERIZATION     many yrs ago with stent placed  . CHOLECYSTECTOMY  may 16,2011  . COLONOSCOPY WITH PROPOFOL N/A 09/09/2013   Procedure: COLONOSCOPY WITH PROPOFOL;  Surgeon: Ladene Artist, MD;  [facility];  Service: Endoscopy;  Laterality: N/A;  . CORONARY STENT PLACEMENT    . ESOPHAGOGASTRODUODENOSCOPY (EGD) WITH PROPOFOL N/A 09/09/2013   Procedure: ESOPHAGOGASTRODUODENOSCOPY (EGD) WITH PROPOFOL;  Surgeon: Ladene Artist, MD;  [facility];  Service: Endoscopy;  Laterality: N/A;  . INGUINAL HERNIA REPAIR      There were no  vitals filed for this visit.  Subjective Assessment - 04/02/18 0932    Subjective  the back pain is the same. It comes and goes.     Patient Stated Goals  reduce pain    Currently in Pain?  Yes    Pain Score  3     Pain Location  Back    Pain Orientation  Right    Pain Descriptors / Indicators  Aching    Pain Type  Acute pain    Pain Radiating Towards  intermittent into right LE    Pain Onset  More than a month ago    Pain Frequency  Intermittent    Aggravating Factors   not sure    Pain Relieving Factors  pain medication, lying down    Multiple Pain Sites  No         [facility] PT Assessment - 04/02/18 0001      Posture/Postural Control   Posture/Postural Control  Postural limitations    Postural Limitations  Decreased lumbar lordosis      AROM   Lumbar Flexion  decreased by 25%    Lumbar Extension  decreased by 50%    Lumbar - Right Side Bend  full    Lumbar - Left Side Bend  decreaed by 50% with pain  [facility] [place] Adult PT Treatment/Exercise - 04/02/18 0001      Lumbar Exercises: Standing   Heel Raises  20 reps      Lumbar Exercises: Seated   Other Seated Lumbar Exercises  diagonals with red weighted ball x 10 ball bil      Knee/Hip Exercises: Aerobic   Nustep  L2 x 10 min   PT present to monitor for pain/fatigue and to discuss progre     Knee/Hip Exercises: Standing   Heel Raises  Both;20 reps    Hip Flexion  Stengthening;Both;2 sets;10 reps;Knee bent    Hip Flexion Limitations  2#    Hip Abduction  Stengthening;Both;10 reps;4 sets;Knee straight    Abduction Limitations  2# added      Knee/Hip Exercises: Seated   Long Arc Quad  Strengthening;Both;10 reps;2 sets   5 sec hold   Long Arc Quad Weight  2 lbs.   needs verbal cues to fully extend knee   Ball Squeeze  x 20 5 sec hold    Marching  Strengthening;Both;2 sets;10 reps;Weights    Marching Weights  2 lbs.               PT Short Term Goals - 04/02/18 1015      PT SHORT  TERM GOAL #1   Title  The patient will be indep with HEP for LE strengthening, stretching and high level balance.    Time  4    Period  Weeks      PT SHORT TERM GOAL #2   Title  understand correct body mechanics to decresae strain on lumbar spine    Time  4    Period  Weeks    Status  Achieved      PT SHORT TERM GOAL #3   Title  pain remains in lumbar instead of traveling down the right leg for 2 weeks    Baseline  Rt buttock and intermittent in calf    Time  4    Period  Weeks    Status  On-going        PT Long Term Goals - 04/02/18 1016      PT LONG TERM GOAL #1   Title  The patient will be indep with home exercise progression for post d/c activities.    Time  8    Period  Weeks    Status  On-going      PT LONG TERM GOAL #2   Title  The patient will improve TUG from 18 seconds to < or equal to 13.5 seconds to demo dec'd risk for falls.    Baseline  13.85    Time  8    Period  Weeks    Status  On-going      PT LONG TERM GOAL #3   Title  lumbar pain decreased >/= 75% so he is able to participate in daily activities and return to shopping with his wife    Baseline  10%    Time  8    Period  Weeks    Status  On-going            Plan - 04/02/18 1012    Clinical Impression Statement  When patient would exercise the right leg he would get 3/10 pain in right lateral calf.  Patient is not able to fully extend his knees in sitting with LAQ. Patient was able to exercise with minimal fatique. Patient has an appoimtment with the doctor. Patient with chronic balance,  strength and endurance deficits and recent onset of right lower extremity radiculopathy and will continue to benefit from skilled PT.     Rehab Potential  Excellent    Clinical Impairments Affecting Rehab Potential  Diabetes; Coronary artery disease; Mild cognitive impairment    PT Frequency  2x / week    PT Duration  8 weeks    PT Treatment/Interventions  Cryotherapy;Electrical Stimulation;Moist  Heat;Traction;Balance training;Therapeutic exercise;Therapeutic activities;Neuromuscular re-education;Patient/family education;Manual techniques;Dry needling    PT Next Visit Plan  Hip strength, flexibility, balance and gait while monitoring for right leg pain and fatique    PT Home Exercise Plan  Access Code: [email or web address] sit to stand and seated HS stretch, bridging and marching    Consulted and Agree with Plan of Care  Patient       Patient will benefit from skilled therapeutic intervention in order to improve the following deficits and impairments:  Abnormal gait, Pain, Increased fascial restricitons, Decreased mobility, Increased muscle spasms, Decreased activity tolerance, Decreased endurance, Decreased range of motion, Decreased strength, Difficulty walking, Decreased balance  Visit Diagnosis: Acute right-sided low back pain with right-sided sciatica  Other abnormalities of gait and mobility  Muscle weakness (generalized)     Problem List Patient Active Problem List   Diagnosis Date Noted  . SDH (subdural hematoma) ([place]) 05/23/2016  . Fall   . Head injury   . History of DVT of lower extremity   . Lip laceration   . Supratherapeutic INR   . Hyperglycemia   . Iron deficiency anemia, unspecified 09/09/2013  . Nonspecific abnormal finding in stool contents 09/09/2013  . CRI (chronic renal insufficiency) 09/03/2013  . Hx of adenomatous colonic polyps 09/03/2013  . CAD (coronary artery disease) of artery bypass graft 09/03/2013  . Acute thromboembolism of deep veins of lower extremity ([place]) 11/01/2009  . DIABETES MELLITUS, TYPE II 10/29/2009  . HYPERLIPIDEMIA 10/29/2009  . ANXIETY 10/29/2009  . Obstructive sleep apnea 10/29/2009  . HYPERTENSION 10/29/2009  . DIVERTICULOSIS, COLON 10/29/2009    Earlie Counts, PT 04/02/18 10:17 AM   [facility] [address], [place], [place], [address] Phone: [phone]   Fax:   [phone]  Name: MATEUS REWERTS MRN: [id number] Date of Birth: 02-07-1928

## 2018-04-04 ENCOUNTER — Ambulatory Visit: Payer: Medicare Other

## 2018-04-04 DIAGNOSIS — R2689 Other abnormalities of gait and mobility: Secondary | ICD-10-CM | POA: Diagnosis not present

## 2018-04-04 DIAGNOSIS — M5441 Lumbago with sciatica, right side: Secondary | ICD-10-CM

## 2018-04-04 DIAGNOSIS — M6281 Muscle weakness (generalized): Secondary | ICD-10-CM

## 2018-04-04 NOTE — Therapy (Signed)
[facility] [address] [place], [place], [address] Phone: [phone]   Fax:  [phone]  Physical Therapy Treatment  Patient Details  Name: Alejandro Russell MRN: [id number] Date of Birth: 02-06-1928 Referring Provider (PT): Dr. Marton Redwood   Encounter Date: 04/04/2018  PT End of Session - 04/04/18 1002    Visit Number  13    Date for PT Re-Evaluation  04/19/18    Authorization Type  Medicare AARP    PT Start Time  0930    PT Stop Time  1012    PT Time Calculation (min)  42 min    Activity Tolerance  Patient tolerated treatment well    Behavior During Therapy  [facility] for tasks assessed/performed       Past Medical History:  Diagnosis Date  . Anemia   . Anxiety   . CAD (coronary artery disease)   . Chronic renal disease, stage III ([place])   . Depression   . Diabetes mellitus   . Diverticulosis   . Diverticulosis   . DVT (deep venous thrombosis) ([facility])    on coumadin  . Hyperlipidemia   . Hypertension   . Myocardial infarct ([place])   . Nonspecific abnormal finding in stool contents 09/09/2013  . Obesity   . OSA (obstructive sleep apnea)    wears CPAP  . Peripheral neuropathy   . Psoriasis   . Tubulovillous adenoma of colon 1995  . Type 2 diabetes mellitus ([place])   . Vitamin B12 deficiency     Past Surgical History:  Procedure Laterality Date  . CARDIAC CATHETERIZATION     many yrs ago with stent placed  . CHOLECYSTECTOMY  may 16,2011  . COLONOSCOPY WITH PROPOFOL N/A 09/09/2013   Procedure: COLONOSCOPY WITH PROPOFOL;  Surgeon: Ladene Artist, MD;  [facility];  Service: Endoscopy;  Laterality: N/A;  . CORONARY STENT PLACEMENT    . ESOPHAGOGASTRODUODENOSCOPY (EGD) WITH PROPOFOL N/A 09/09/2013   Procedure: ESOPHAGOGASTRODUODENOSCOPY (EGD) WITH PROPOFOL;  Surgeon: Ladene Artist, MD;  [facility];  Service: Endoscopy;  Laterality: N/A;  . INGUINAL HERNIA REPAIR      There were no  vitals filed for this visit.  Subjective Assessment - 04/04/18 0925    Subjective  My pain isn't too bad.  I'm doing my exercises somewhat.      Patient Stated Goals  reduce pain    Currently in Pain?  Yes    Pain Score  4     Pain Location  Back    Pain Orientation  Right    Pain Descriptors / Indicators  Aching    Pain Type  Chronic pain                       [facility] Adult PT Treatment/Exercise - 04/04/18 0001      Exercises   Exercises  Lumbar;Knee/Hip      Lumbar Exercises: Standing   Heel Raises  20 reps      Lumbar Exercises: Seated   Other Seated Lumbar Exercises  rowing with red band 2x10    Other Seated Lumbar Exercises  shoulder flexion and scaption: 2# 2x10 bil each      Knee/Hip Exercises: Aerobic   Nustep  L2 x 10 min   PT present to monitor for pain/fatigue     Knee/Hip Exercises: Standing   Heel Raises  Both;20 reps    Forward Step Up  Both;2 sets;10 reps;Step Height: 6"  Knee/Hip Exercises: Seated   Long Arc [employer]  Strengthening;Both;10 reps;2 sets   5 sec hold   Long Arc Quad Weight  2 lbs.   needs verbal cues to fully extend knee   Ball Squeeze  x 20 5 sec hold    Marching  Strengthening;Both;2 sets;10 reps;Weights    Marching Weights  2 lbs.    Sit to Sand  without UE support;2 sets;10 reps               PT Short Term Goals - 04/02/18 1015      PT SHORT TERM GOAL #1   Title  The patient will be indep with HEP for LE strengthening, stretching and high level balance.    Time  4    Period  Weeks      PT SHORT TERM GOAL #2   Title  understand correct body mechanics to decresae strain on lumbar spine    Time  4    Period  Weeks    Status  Achieved      PT SHORT TERM GOAL #3   Title  pain remains in lumbar instead of traveling down the right leg for 2 weeks    Baseline  Rt buttock and intermittent in calf    Time  4    Period  Weeks    Status  On-going        PT Long Term Goals - 04/02/18 1016      PT LONG  TERM GOAL #1   Title  The patient will be indep with home exercise progression for post d/c activities.    Time  8    Period  Weeks    Status  On-going      PT LONG TERM GOAL #2   Title  The patient will improve TUG from 18 seconds to < or equal to 13.5 seconds to demo dec'd risk for falls.    Baseline  13.85    Time  8    Period  Weeks    Status  On-going      PT LONG TERM GOAL #3   Title  lumbar pain decreased >/= 75% so he is able to participate in daily activities and return to shopping with his wife    Baseline  10%    Time  8    Period  Weeks    Status  On-going            Plan - 04/04/18 0865    Clinical Impression Statement  Pt performed circuit training for strength and endurance today without difficulty.  Pt tolerated addition of arm exercises with 2# weights well.  Pt requires close supervision and guarding with standing exercises for safety and to monitor for fatigue.  Pt with Rt>Lt quad lag due to hamstring tightness and quad weakness.  Pt will continue to benefit from skilled PT for strength, balance and endurance progression.    Rehab Potential  Excellent    Clinical Impairments Affecting Rehab Potential  Diabetes; Coronary artery disease; Mild cognitive impairment    PT Frequency  2x / week    PT Duration  8 weeks    PT Treatment/Interventions  Cryotherapy;Electrical Stimulation;Moist Heat;Traction;Balance training;Therapeutic exercise;Therapeutic activities;Neuromuscular re-education;Patient/family education;Manual techniques;Dry needling    PT Next Visit Plan  Hip strength, flexibility, balance and gait while monitoring for right leg pain and fatique    PT Home Exercise Plan  Access Code: [email or web address] sit to stand and seated HS stretch, bridging and marching  Consulted and Agree with Plan of Care  Patient       Patient will benefit from skilled therapeutic intervention in order to improve the following deficits and impairments:  Abnormal gait, Pain, Increased  fascial restricitons, Decreased mobility, Increased muscle spasms, Decreased activity tolerance, Decreased endurance, Decreased range of motion, Decreased strength, Difficulty walking, Decreased balance  Visit Diagnosis: Acute right-sided low back pain with right-sided sciatica  Other abnormalities of gait and mobility  Muscle weakness (generalized)     Problem List Patient Active Problem List   Diagnosis Date Noted  . SDH (subdural hematoma) ([place]) 05/23/2016  . Fall   . Head injury   . History of DVT of lower extremity   . Lip laceration   . Supratherapeutic INR   . Hyperglycemia   . Iron deficiency anemia, unspecified 09/09/2013  . Nonspecific abnormal finding in stool contents 09/09/2013  . CRI (chronic renal insufficiency) 09/03/2013  . Hx of adenomatous colonic polyps 09/03/2013  . CAD (coronary artery disease) of artery bypass graft 09/03/2013  . Acute thromboembolism of deep veins of lower extremity ([place]) 11/01/2009  . DIABETES MELLITUS, TYPE II 10/29/2009  . HYPERLIPIDEMIA 10/29/2009  . ANXIETY 10/29/2009  . Obstructive sleep apnea 10/29/2009  . HYPERTENSION 10/29/2009  . DIVERTICULOSIS, COLON 10/29/2009    Sigurd Sos, PT 04/04/18 10:04 AM  [facility] [address] [place], [place], [address] Phone: [phone]   Fax:  [phone]  Name: Alejandro Russell MRN: [id number] Date of Birth: 02-13-1928

## 2018-04-09 ENCOUNTER — Encounter: Payer: Self-pay | Admitting: Physical Therapy

## 2018-04-09 ENCOUNTER — Ambulatory Visit: Payer: Medicare Other | Admitting: Physical Therapy

## 2018-04-09 DIAGNOSIS — M5416 Radiculopathy, lumbar region: Secondary | ICD-10-CM | POA: Diagnosis not present

## 2018-04-09 DIAGNOSIS — R2689 Other abnormalities of gait and mobility: Secondary | ICD-10-CM

## 2018-04-09 DIAGNOSIS — M5441 Lumbago with sciatica, right side: Secondary | ICD-10-CM | POA: Diagnosis not present

## 2018-04-09 DIAGNOSIS — M5136 Other intervertebral disc degeneration, lumbar region: Secondary | ICD-10-CM | POA: Diagnosis not present

## 2018-04-09 DIAGNOSIS — M546 Pain in thoracic spine: Secondary | ICD-10-CM | POA: Diagnosis not present

## 2018-04-09 DIAGNOSIS — M6281 Muscle weakness (generalized): Secondary | ICD-10-CM | POA: Diagnosis not present

## 2018-04-09 DIAGNOSIS — M47816 Spondylosis without myelopathy or radiculopathy, lumbar region: Secondary | ICD-10-CM | POA: Diagnosis not present

## 2018-04-09 DIAGNOSIS — M545 Low back pain: Secondary | ICD-10-CM | POA: Diagnosis not present

## 2018-04-09 DIAGNOSIS — M5126 Other intervertebral disc displacement, lumbar region: Secondary | ICD-10-CM | POA: Diagnosis not present

## 2018-04-09 NOTE — Therapy (Signed)
[facility] [address] [place], [place], [address] Phone: [phone]   Fax:  [phone]  Physical Therapy Treatment  Patient Details  Name: Alejandro Russell MRN: [id number] Date of Birth: 07/21/1927 Referring Provider (PT): Dr. Marton Redwood   Encounter Date: 04/09/2018  PT End of Session - 04/09/18 1259    Visit Number  14    Date for PT Re-Evaluation  04/19/18    Authorization Type  Medicare AARP    PT Start Time  1230    PT Stop Time  1310    PT Time Calculation (min)  40 min    Activity Tolerance  Patient tolerated treatment well;No increased pain    Behavior During Therapy  WFL for tasks assessed/performed       Past Medical History:  Diagnosis Date  . Anemia   . Anxiety   . CAD (coronary artery disease)   . Chronic renal disease, stage III ([place])   . Depression   . Diabetes mellitus   . Diverticulosis   . Diverticulosis   . DVT (deep venous thrombosis) (HCC)    on coumadin  . Hyperlipidemia   . Hypertension   . Myocardial infarct ([place])   . Nonspecific abnormal finding in stool contents 09/09/2013  . Obesity   . OSA (obstructive sleep apnea)    wears CPAP  . Peripheral neuropathy   . Psoriasis   . Tubulovillous adenoma of colon 1995  . Type 2 diabetes mellitus ([place])   . Vitamin B12 deficiency     Past Surgical History:  Procedure Laterality Date  . CARDIAC CATHETERIZATION     many yrs ago with stent placed  . CHOLECYSTECTOMY  may 16,2011  . COLONOSCOPY WITH PROPOFOL N/A 09/09/2013   Procedure: COLONOSCOPY WITH PROPOFOL;  Surgeon: Ladene Artist, MD;  Location: [facility];  Service: Endoscopy;  Laterality: N/A;  . CORONARY STENT PLACEMENT    . ESOPHAGOGASTRODUODENOSCOPY (EGD) WITH PROPOFOL N/A 09/09/2013   Procedure: ESOPHAGOGASTRODUODENOSCOPY (EGD) WITH PROPOFOL;  Surgeon: Ladene Artist, MD;  [facility];  Service: Endoscopy;  Laterality: N/A;  . INGUINAL HERNIA REPAIR       There were no vitals filed for this visit.  Subjective Assessment - 04/09/18 1242    Subjective  I am having a little pain in the  right buttocks.     Patient Stated Goals  reduce pain    Currently in Pain?  Yes    Pain Score  5     Pain Location  Back    Pain Orientation  Right    Pain Descriptors / Indicators  Aching    Pain Type  Chronic pain    Pain Radiating Towards  intermittent into right LE    Pain Onset  More than a month ago    Pain Frequency  Intermittent    Aggravating Factors   not sure    Pain Relieving Factors  pain medication  lying down    Multiple Pain Sites  No         [facility] PT Assessment - 04/09/18 0001      Standardized Balance Assessment   Standardized Balance Assessment  Five Times Sit to Stand    Five times sit to stand comments   19 sec   19 sec no hands; 20 sec no hands                  [facility] Adult PT Treatment/Exercise - 04/09/18 0001  Lumbar Exercises: Standing   Heel Raises  20 reps      Lumbar Exercises: Seated   Other Seated Lumbar Exercises  rowing with red band 2x10    Other Seated Lumbar Exercises  shoulder flexion and scaption: 2# 2x10 bil each      Knee/Hip Exercises: Aerobic   Nustep  L2 x 10 min   PT present to monitor for pain/fatigue     Knee/Hip Exercises: Standing   Heel Raises  Both;20 reps    Forward Step Up  Both;10 reps;Step Height: 6";3 sets      Knee/Hip Exercises: Seated   Long Arc Quad  Strengthening;Both;10 reps;3 sets   5 sec hold   Long Arc Quad Weight  3 lbs.   needs verbal cues to fully extend knee   Ball Squeeze  x 20 5 sec hold    Marching  Strengthening;Both;10 reps;Weights;3 sets    Marching Weights  3 lbs.    Sit to Sand  without UE support;2 sets;10 reps               PT Short Term Goals - 04/02/18 1015      PT SHORT TERM GOAL #1   Title  The patient will be indep with HEP for LE strengthening, stretching and high level balance.    Time  4    Period  Weeks       PT SHORT TERM GOAL #2   Title  understand correct body mechanics to decresae strain on lumbar spine    Time  4    Period  Weeks    Status  Achieved      PT SHORT TERM GOAL #3   Title  pain remains in lumbar instead of traveling down the right leg for 2 weeks    Baseline  Rt buttock and intermittent in calf    Time  4    Period  Weeks    Status  On-going        PT Long Term Goals - 04/09/18 1259      PT LONG TERM GOAL #1   Title  The patient will be indep with home exercise progression for post d/c activities.    Time  8    Period  Weeks    Status  On-going      PT LONG TERM GOAL #2   Title  The patient will improve TUG from 18 seconds to < or equal to 13.5 seconds to demo dec'd risk for falls.    Baseline  13.85    Time  8    Period  Weeks    Status  On-going      PT LONG TERM GOAL #3   Title  lumbar pain decreased >/= 75% so he is able to participate in daily activities and return to shopping with his wife    Baseline  10%    Time  8    Period  Weeks    Status  On-going      PT LONG TERM GOAL #4   Title  5 times sit to stand score </= 14.8 second improving his overall balance    Baseline  17.84 seconds    Time  8    Period  Weeks    Status  On-going      PT LONG TERM GOAL #5   Title  The patient will verbalize understanding of community exercise classes for transition to long term exercise program.    Baseline  we have talked about group exercise    Time  8    Period  Weeks    Status  On-going            Plan - 04/09/18 1244    Clinical Impression Statement  Patient is not fatiquing as much.  Patient able to do 3 pound weight on his ankles.  Patient is slower with sit to stand when he is not using his hands. Patient still has trouble with memory.  Patient walks without deficits. Patient is seeing the neruosurgeon today. Patient will benefit from skilled therapy to to improve strength, balance, and edurance progression.     Rehab Potential  Excellent     Clinical Impairments Affecting Rehab Potential  Diabetes; Coronary artery disease; Mild cognitive impairment    PT Frequency  2x / week    PT Duration  8 weeks    PT Treatment/Interventions  Cryotherapy;Electrical Stimulation;Moist Heat;Traction;Balance training;Therapeutic exercise;Therapeutic activities;Neuromuscular re-education;Patient/family education;Manual techniques;Dry needling    PT Next Visit Plan  Hip strength, flexibility, balance and gait while monitoring for right leg pain and fatique    PT Home Exercise Plan  Access Code: [email or web address] sit to stand and seated HS stretch, bridging and marching    Consulted and Agree with Plan of Care  Patient    Family Member Consulted  wife       Patient will benefit from skilled therapeutic intervention in order to improve the following deficits and impairments:  Abnormal gait, Pain, Increased fascial restricitons, Decreased mobility, Increased muscle spasms, Decreased activity tolerance, Decreased endurance, Decreased range of motion, Decreased strength, Difficulty walking, Decreased balance  Visit Diagnosis: Acute right-sided low back pain with right-sided sciatica  Other abnormalities of gait and mobility  Muscle weakness (generalized)     Problem List Patient Active Problem List   Diagnosis Date Noted  . SDH (subdural hematoma) ([place]) 05/23/2016  . Fall   . Head injury   . History of DVT of lower extremity   . Lip laceration   . Supratherapeutic INR   . Hyperglycemia   . Iron deficiency anemia, unspecified 09/09/2013  . Nonspecific abnormal finding in stool contents 09/09/2013  . CRI (chronic renal insufficiency) 09/03/2013  . Hx of adenomatous colonic polyps 09/03/2013  . CAD (coronary artery disease) of artery bypass graft 09/03/2013  . Acute thromboembolism of deep veins of lower extremity ([place]) 11/01/2009  . DIABETES MELLITUS, TYPE II 10/29/2009  . HYPERLIPIDEMIA 10/29/2009  . ANXIETY 10/29/2009  . Obstructive sleep apnea  10/29/2009  . HYPERTENSION 10/29/2009  . DIVERTICULOSIS, COLON 10/29/2009    Earlie Counts, PT 04/09/18 1:25 PM   [facility] [address], [place], [place], [address] Phone: [phone]   Fax:  [phone]  Name: DORSEY AUTHEMENT MRN: [id number] Date of Birth: 1927/07/01

## 2018-04-11 ENCOUNTER — Encounter: Payer: Self-pay | Admitting: Physical Therapy

## 2018-04-11 ENCOUNTER — Ambulatory Visit: Payer: Medicare Other | Admitting: Physical Therapy

## 2018-04-11 DIAGNOSIS — M6281 Muscle weakness (generalized): Secondary | ICD-10-CM | POA: Diagnosis not present

## 2018-04-11 DIAGNOSIS — M5441 Lumbago with sciatica, right side: Secondary | ICD-10-CM | POA: Diagnosis not present

## 2018-04-11 DIAGNOSIS — R2689 Other abnormalities of gait and mobility: Secondary | ICD-10-CM

## 2018-04-11 NOTE — Therapy (Signed)
[facility] [address] [place], [place], [address] Phone: [phone]   Fax:  [phone]  Physical Therapy Treatment  Patient Details  Name: Alejandro Russell MRN: [id number] Date of Birth: 05-14-1928 Referring Provider (PT): Dr. Marton Redwood   Encounter Date: 04/11/2018  PT End of Session - 04/11/18 1230    Visit Number  15    Date for PT Re-Evaluation  04/19/18    Authorization Type  Medicare AARP    PT Start Time  1223    PT Stop Time  1308    PT Time Calculation (min)  45 min    Activity Tolerance  Patient tolerated treatment well;No increased pain    Behavior During Therapy  WFL for tasks assessed/performed       Past Medical History:  Diagnosis Date  . Anemia   . Anxiety   . CAD (coronary artery disease)   . Chronic renal disease, stage III ([place])   . Depression   . Diabetes mellitus   . Diverticulosis   . Diverticulosis   . DVT (deep venous thrombosis) (HCC)    on coumadin  . Hyperlipidemia   . Hypertension   . Myocardial infarct ([place])   . Nonspecific abnormal finding in stool contents 09/09/2013  . Obesity   . OSA (obstructive sleep apnea)    wears CPAP  . Peripheral neuropathy   . Psoriasis   . Tubulovillous adenoma of colon 1995  . Type 2 diabetes mellitus ([place])   . Vitamin B12 deficiency     Past Surgical History:  Procedure Laterality Date  . CARDIAC CATHETERIZATION     many yrs ago with stent placed  . CHOLECYSTECTOMY  may 16,2011  . COLONOSCOPY WITH PROPOFOL N/A 09/09/2013   Procedure: COLONOSCOPY WITH PROPOFOL;  Surgeon: Ladene Artist, MD;  [facility];  Service: Endoscopy;  Laterality: N/A;  . CORONARY STENT PLACEMENT    . ESOPHAGOGASTRODUODENOSCOPY (EGD) WITH PROPOFOL N/A 09/09/2013   Procedure: ESOPHAGOGASTRODUODENOSCOPY (EGD) WITH PROPOFOL;  Surgeon: Ladene Artist, MD;  [facility];  Service: Endoscopy;  Laterality: N/A;  . INGUINAL HERNIA REPAIR       There were no vitals filed for this visit.  Subjective Assessment - 04/11/18 1228    Subjective  My pain was not bad last night.     Patient Stated Goals  reduce pain    Currently in Pain?  Yes    Pain Score  5     Pain Location  Back    Pain Orientation  Right    Pain Descriptors / Indicators  Aching    Pain Type  Chronic pain    Pain Radiating Towards  intermittent into right leg    Pain Onset  More than a month ago    Pain Frequency  Intermittent    Aggravating Factors   not sure    Pain Relieving Factors  pain medication, lying down    Multiple Pain Sites  No         [facility] PT Assessment - 04/11/18 0001      Assessment   Medical Diagnosis  [id number] Back pain, lumbar with radiculopathy    Referring Provider (PT)  Dr. Marton Redwood    Onset Date/Surgical Date  01/29/18    Prior Therapy  none      Precautions   Precautions  None      Strength   Right Hip Flexion  4+/5    Left Hip Flexion  4+/5                   [facility] Adult PT Treatment/Exercise - 04/11/18 0001      Therapeutic Activites    Therapeutic Activities  Other Therapeutic Activities    Other Therapeutic Activities  getting in and out of car, sitting in car      Lumbar Exercises: Standing   Heel Raises  20 reps   on stair to stretch heel cord too     Lumbar Exercises: Seated   Other Seated Lumbar Exercises  rowing with red band 2x10   standing, VC to squeeze shoulder blades   Other Seated Lumbar Exercises  shoulder flexion and scaption: 2# 2x10 bil each      Knee/Hip Exercises: Aerobic   Nustep  L2 x 10 min   PT present to monitor for pain/fatigue     Knee/Hip Exercises: Standing   Heel Raises  Both;20 reps   on step to get a stretch on heel cord   Forward Step Up  Both;10 reps;Step Height: 6";3 sets      Knee/Hip Exercises: Seated   Long Arc Quad  Strengthening;Both;10 reps;3 sets   5 sec hold; with ball squeeze   Long Arc Quad Weight  3 lbs.   needs verbal cues to fully extend  knee   Marching  Strengthening;Both;10 reps;Weights;3 sets    Marching Weights  3 lbs.    Sit to Sand  without UE support;2 sets;10 reps             PT Education - 04/11/18 1313    Education Details  dicussed with patient and his wife about going to a community based exercise program and to get out with wife    Person(s) Educated  Patient;Spouse    Methods  Explanation    Comprehension  Verbalized understanding       PT Short Term Goals - 04/11/18 1232      PT SHORT TERM GOAL #3   Title  pain remains in lumbar instead of traveling down the right leg for 2 weeks    Baseline  Rt buttock and intermittent in calf    Time  4    Period  Weeks    Status  On-going        PT Long Term Goals - 04/11/18 1253      PT LONG TERM GOAL #3   Title  lumbar pain decreased >/= 75% so he is able to participate in daily activities and return to shopping with his wife    Baseline  50%    Time  8    Period  Weeks    Status  On-going      PT LONG TERM GOAL #5   Title  The patient will verbalize understanding of community exercise classes for transition to long term exercise program.    Baseline  we have talked about group exercise    Time  8    Period  Weeks    Status  On-going            Plan - 04/11/18 1318    Clinical Impression Statement  Patient is able to perform his exercise program without complaint of pain. Patient is able to walk up and down a curb without loss of balance. Patient is able to get in and out of car without difficulty. Patient likes to sit in the car in a reclined position and will move his legs. Patient has increased bilateral  hip flexion. Patient is getting out more with his wife. Patient and wife are open to getting back to the community chair aerobic program. Patient will benefit from skilled therapy to progress to HEP and community based program in the next two vsits.     Rehab Potential  Excellent    Clinical Impairments Affecting Rehab Potential   Diabetes; Coronary artery disease; Mild cognitive impairment    PT Frequency  2x / week    PT Duration  8 weeks    PT Treatment/Interventions  Cryotherapy;Electrical Stimulation;Moist Heat;Traction;Balance training;Therapeutic exercise;Therapeutic activities;Neuromuscular re-education;Patient/family education;Manual techniques;Dry needling    PT Next Visit Plan  work on HEP; Patient should be discharged in 2 visit; prepare him for discharge and discuss community programs    PT Home Exercise Plan  Access Code: [email or web address]     Consulted and Agree with Plan of Care  Patient    Family Member Consulted  wife       Patient will benefit from skilled therapeutic intervention in order to improve the following deficits and impairments:  Abnormal gait, Pain, Increased fascial restricitons, Decreased mobility, Increased muscle spasms, Decreased activity tolerance, Decreased endurance, Decreased range of motion, Decreased strength, Difficulty walking, Decreased balance  Visit Diagnosis: Acute right-sided low back pain with right-sided sciatica  Other abnormalities of gait and mobility  Muscle weakness (generalized)     Problem List Patient Active Problem List   Diagnosis Date Noted  . SDH (subdural hematoma) ([place]) 05/23/2016  . Fall   . Head injury   . History of DVT of lower extremity   . Lip laceration   . Supratherapeutic INR   . Hyperglycemia   . Iron deficiency anemia, unspecified 09/09/2013  . Nonspecific abnormal finding in stool contents 09/09/2013  . CRI (chronic renal insufficiency) 09/03/2013  . Hx of adenomatous colonic polyps 09/03/2013  . CAD (coronary artery disease) of artery bypass graft 09/03/2013  . Acute thromboembolism of deep veins of lower extremity ([place]) 11/01/2009  . DIABETES MELLITUS, TYPE II 10/29/2009  . HYPERLIPIDEMIA 10/29/2009  . ANXIETY 10/29/2009  . Obstructive sleep apnea 10/29/2009  . HYPERTENSION 10/29/2009  . DIVERTICULOSIS, COLON 10/29/2009     Earlie Counts, PT 04/11/18 1:22 PM   [facility] [address], [place], [place], [address] Phone: [phone]   Fax:  [phone]  Name: Alejandro Russell MRN: [id number] Date of Birth: [birth year]

## 2018-04-16 ENCOUNTER — Ambulatory Visit: Payer: Medicare Other

## 2018-04-16 DIAGNOSIS — M6281 Muscle weakness (generalized): Secondary | ICD-10-CM | POA: Diagnosis not present

## 2018-04-16 DIAGNOSIS — R2689 Other abnormalities of gait and mobility: Secondary | ICD-10-CM

## 2018-04-16 DIAGNOSIS — M5441 Lumbago with sciatica, right side: Secondary | ICD-10-CM

## 2018-04-16 NOTE — Therapy (Signed)
[facility] [address], [place], [place], [address] Phone: [phone]   Fax:  [phone]  Physical Therapy Treatment  Patient Details  Name: Alejandro Russell MRN: [id number] Date of Birth: 11-05-1927 Referring Provider (PT): Dr. Marton Redwood   Encounter Date: 04/16/2018  PT End of Session - 04/16/18 1315    Visit Number  15    Date for PT Re-Evaluation  04/19/18    Authorization Type  Medicare AARP    PT Start Time  1230    PT Stop Time  1309    PT Time Calculation (min)  39 min    Activity Tolerance  Patient tolerated treatment well;No increased pain    Behavior During Therapy  WFL for tasks assessed/performed       Past Medical History:  Diagnosis Date  . Anemia   . Anxiety   . CAD (coronary artery disease)   . Chronic renal disease, stage III ([place])   . Depression   . Diabetes mellitus   . Diverticulosis   . Diverticulosis   . DVT (deep venous thrombosis) (HCC)    on coumadin  . Hyperlipidemia   . Hypertension   . Myocardial infarct ([place])   . Nonspecific abnormal finding in stool contents 09/09/2013  . Obesity   . OSA (obstructive sleep apnea)    wears CPAP  . Peripheral neuropathy   . Psoriasis   . Tubulovillous adenoma of colon 1995  . Type 2 diabetes mellitus ([place])   . Vitamin B12 deficiency     Past Surgical History:  Procedure Laterality Date  . CARDIAC CATHETERIZATION     many yrs ago with stent placed  . CHOLECYSTECTOMY  may 16,2011  . COLONOSCOPY WITH PROPOFOL N/A 09/09/2013   Procedure: COLONOSCOPY WITH PROPOFOL;  Surgeon: Ladene Artist, MD;  [facility];  Service: Endoscopy;  Laterality: N/A;  . CORONARY STENT PLACEMENT    . ESOPHAGOGASTRODUODENOSCOPY (EGD) WITH PROPOFOL N/A 09/09/2013   Procedure: ESOPHAGOGASTRODUODENOSCOPY (EGD) WITH PROPOFOL;  Surgeon: Ladene Artist, MD;  [facility];  Service: Endoscopy;  Laterality: N/A;  . INGUINAL HERNIA REPAIR       There were no vitals filed for this visit.  Subjective Assessment - 04/16/18 1241    Subjective  I went to chair exercise with my wife yesterday.      Currently in Pain?  Yes    Pain Score  4     Pain Location  Back    Pain Orientation  Right    Pain Descriptors / Indicators  Aching    Pain Type  Chronic pain    Pain Onset  More than a month ago    Pain Frequency  Intermittent    Aggravating Factors   not sure    Pain Relieving Factors  pain medication, supine         [facility] PT Assessment - 04/16/18 0001      Observation/Other Assessments   Focus on Therapeutic Outcomes (FOTO)   35% limitation      Standardized Balance Assessment   Standardized Balance Assessment  Five Times Sit to Stand    Five times sit to stand comments   15.41   no hands                  [facility] Adult PT Treatment/Exercise - 04/16/18 0001      Lumbar Exercises: Standing   Heel Raises  20 reps   on stair to stretch heel  cord too     Lumbar Exercises: Seated   Other Seated Lumbar Exercises  rowing with red band 2x10   standing, VC to squeeze shoulder blades   Other Seated Lumbar Exercises  shoulder flexion and scaption: 2# 2x10 bil each      Knee/Hip Exercises: Aerobic   Nustep  L2 x 10 min   PT present to monitor for pain/fatigue     Knee/Hip Exercises: Standing   Heel Raises  Both;20 reps   on step to get a stretch on heel cord     Knee/Hip Exercises: Seated   Long Arc Quad  Strengthening;Both;10 reps;3 sets   5 sec hold; with ball squeeze   Long Arc Quad Weight  3 lbs.   needs verbal cues to fully extend knee   Marching  Strengthening;Both;10 reps;Weights;3 sets    Marching Weights  3 lbs.    Sit to Sand  without UE support;2 sets;10 reps               PT Short Term Goals - 04/11/18 1232      PT SHORT TERM GOAL #3   Title  pain remains in lumbar instead of traveling down the right leg for 2 weeks    Baseline  Rt buttock and intermittent in calf    Time  4     Period  Weeks    Status  On-going        PT Long Term Goals - 04/16/18 1246      PT LONG TERM GOAL #3   Title  lumbar pain decreased >/= 75% so he is able to participate in daily activities and return to shopping with his wife    Baseline  50%    Time  8    Period  Weeks    Status  On-going      PT LONG TERM GOAL #4   Title  5 times sit to stand score </= 14.8 second improving his overall balance    Baseline  15.41 seconds    Period  Weeks    Status  Partially Met      PT LONG TERM GOAL #5   Title  The patient will verbalize understanding of community exercise classes for transition to long term exercise program.    Status  Achieved            Plan - 04/16/18 1252    Clinical Impression Statement  Pt reports 50% reduction in LBP/Rt LE pain since the start of care.  Pt has attended a [place] chair class this week and plans to continue this after discharge.  5x sit to stand is improved to 15.41 seconds indicating reduced falls risk.  Pt requires stand by assistance by PT for safety and to monitor for pain.  Pt will attend 1 more session to finalize HEP.      Rehab Potential  Excellent    Clinical Impairments Affecting Rehab Potential  Diabetes; Coronary artery disease; Mild cognitive impairment    PT Frequency  2x / week    PT Duration  8 weeks    PT Treatment/Interventions  Cryotherapy;Electrical Stimulation;Moist Heat;Traction;Balance training;Therapeutic exercise;Therapeutic activities;Neuromuscular re-education;Patient/family education;Manual techniques;Dry needling    PT Next Visit Plan  1 more session    PT Home Exercise Plan  Access Code: [email or web address]     Consulted and Agree with Plan of Care  Patient       Patient will benefit from skilled therapeutic intervention in order to improve the  following deficits and impairments:  Abnormal gait, Pain, Increased fascial restricitons, Decreased mobility, Increased muscle spasms, Decreased activity tolerance, Decreased  endurance, Decreased range of motion, Decreased strength, Difficulty walking, Decreased balance  Visit Diagnosis: Acute right-sided low back pain with right-sided sciatica  Other abnormalities of gait and mobility  Muscle weakness (generalized)     Problem List Patient Active Problem List   Diagnosis Date Noted  . SDH (subdural hematoma) ([place]) 05/23/2016  . Fall   . Head injury   . History of DVT of lower extremity   . Lip laceration   . Supratherapeutic INR   . Hyperglycemia   . Iron deficiency anemia, unspecified 09/09/2013  . Nonspecific abnormal finding in stool contents 09/09/2013  . CRI (chronic renal insufficiency) 09/03/2013  . Hx of adenomatous colonic polyps 09/03/2013  . CAD (coronary artery disease) of artery bypass graft 09/03/2013  . Acute thromboembolism of deep veins of lower extremity ([place]) 11/01/2009  . DIABETES MELLITUS, TYPE II 10/29/2009  . HYPERLIPIDEMIA 10/29/2009  . ANXIETY 10/29/2009  . Obstructive sleep apnea 10/29/2009  . HYPERTENSION 10/29/2009  . DIVERTICULOSIS, COLON 10/29/2009    Sigurd Sos, PT 04/16/18 1:17 PM  [facility] [address], [place], [place], [address] Phone: [phone]   Fax:  [phone]  Name: Alejandro Russell MRN: [id number] Date of Birth: 12/30/1927

## 2018-04-18 ENCOUNTER — Ambulatory Visit: Payer: Medicare Other

## 2018-04-18 DIAGNOSIS — M5441 Lumbago with sciatica, right side: Secondary | ICD-10-CM | POA: Diagnosis not present

## 2018-04-18 DIAGNOSIS — R2689 Other abnormalities of gait and mobility: Secondary | ICD-10-CM

## 2018-04-18 DIAGNOSIS — M6281 Muscle weakness (generalized): Secondary | ICD-10-CM

## 2018-04-18 DIAGNOSIS — E538 Deficiency of other specified B group vitamins: Secondary | ICD-10-CM | POA: Diagnosis not present

## 2018-04-18 NOTE — Therapy (Signed)
[facility] [address] [place], [place], [address] Phone: [phone]   Fax:  [phone]  Physical Therapy Treatment  Patient Details  Name: Alejandro Russell MRN: [id number] Date of Birth: Mar 28, 1928 Referring Provider (PT): Dr. Marton Redwood   Encounter Date: 04/18/2018  PT End of Session - 04/18/18 1243    Visit Number  16    PT Start Time  1638    PT Stop Time  4665    PT Time Calculation (min)  41 min    Activity Tolerance  Patient tolerated treatment well;No increased pain    Behavior During Therapy  WFL for tasks assessed/performed       Past Medical History:  Diagnosis Date  . Anemia   . Anxiety   . CAD (coronary artery disease)   . Chronic renal disease, stage III ([place])   . Depression   . Diabetes mellitus   . Diverticulosis   . Diverticulosis   . DVT (deep venous thrombosis) ([facility])    on coumadin  . Hyperlipidemia   . Hypertension   . Myocardial infarct ([place])   . Nonspecific abnormal finding in stool contents 09/09/2013  . Obesity   . OSA (obstructive sleep apnea)    wears CPAP  . Peripheral neuropathy   . Psoriasis   . Tubulovillous adenoma of colon 1995  . Type 2 diabetes mellitus ([place])   . Vitamin B12 deficiency     Past Surgical History:  Procedure Laterality Date  . CARDIAC CATHETERIZATION     many yrs ago with stent placed  . CHOLECYSTECTOMY  may 16,2011  . COLONOSCOPY WITH PROPOFOL N/A 09/09/2013   Procedure: COLONOSCOPY WITH PROPOFOL;  Surgeon: Ladene Artist, MD;  Location: [facility];  Service: Endoscopy;  Laterality: N/A;  . CORONARY STENT PLACEMENT    . ESOPHAGOGASTRODUODENOSCOPY (EGD) WITH PROPOFOL N/A 09/09/2013   Procedure: ESOPHAGOGASTRODUODENOSCOPY (EGD) WITH PROPOFOL;  Surgeon: Ladene Artist, MD;  Location: [facility];  Service: Endoscopy;  Laterality: N/A;  . INGUINAL HERNIA REPAIR      There were no vitals filed for this visit.  Subjective Assessment -  04/18/18 1222    Subjective  50% improvement in Rt LE pain since the start of care.  I went to chair exercise 2x this week- I didn't do good.      Currently in Pain?  No/denies         [facility] PT Assessment - 04/18/18 0001      Assessment   Medical Diagnosis  [id number] Back pain, lumbar with radiculopathy    Referring Provider (PT)  Dr. Marton Redwood    Onset Date/Surgical Date  01/29/18      Observation/Other Assessments   Focus on Therapeutic Outcomes (FOTO)   35% limitation      Strength   Right Hip Flexion  4+/5    Left Hip Flexion  4+/5      Standardized Balance Assessment   Standardized Balance Assessment  Five Times Sit to Stand    Five times sit to stand comments   15.41   no hands     Timed Up and Go Test   TUG  Normal TUG    Normal TUG (seconds)  14                   [facility] Adult PT Treatment/Exercise - 04/18/18 0001      Exercises   Exercises  Lumbar;Knee/Hip      Lumbar Exercises: Standing  Heel Raises  20 reps      Lumbar Exercises: Seated   Other Seated Lumbar Exercises  rowing with red band 2x10   VC to squeeze shoulder blades   Other Seated Lumbar Exercises  shoulder flexion and scaption: 2# 2x10 bil each      Knee/Hip Exercises: Standing   Heel Raises  Both;20 reps   on step to get a stretch on heel cord   Hip Abduction  Stengthening;Both;10 reps;Knee straight;3 sets    Hip Extension  Stengthening;10 reps;3 sets;Knee straight    Forward Step Up  Both;10 reps;Step Height: 6";3 sets      Knee/Hip Exercises: Seated   Long Arc Quad  Strengthening;Both;10 reps;3 sets   5 sec hold; with ball squeeze   Long Arc Quad Weight  3 lbs.   needs verbal cues to fully extend knee   Ball Squeeze  x 20 5 sec hold    Marching  Strengthening;Both;10 reps;Weights;3 sets    Marching Weights  3 lbs.    Sit to Sand  without UE support;2 sets;10 reps             PT Education - 04/18/18 1234    Education Details   Access Code: [email or web address]     Person(s)  Educated  Patient;Spouse    Methods  Explanation;Demonstration;Handout    Comprehension  Verbalized understanding;Returned demonstration       PT Short Term Goals - 04/11/18 1232      PT SHORT TERM GOAL #3   Title  pain remains in lumbar instead of traveling down the right leg for 2 weeks    Baseline  Rt buttock and intermittent in calf    Time  4    Period  Weeks    Status  On-going        PT Long Term Goals - 04/18/18 1208      PT LONG TERM GOAL #1   Title  The patient will be indep with home exercise progression for post d/c activities.    Status  Achieved      PT LONG TERM GOAL #2   Title  The patient will improve TUG from 18 seconds to < or equal to 13.5 seconds to demo dec'd risk for falls.    Baseline  14    Status  Partially Met      PT LONG TERM GOAL #3   Title  lumbar pain decreased >/= 75% so he is able to participate in daily activities and return to shopping with his wife    Baseline  50%    Status  Partially Met      PT LONG TERM GOAL #4   Title  5 times sit to stand score </= 14.8 second improving his overall balance    Baseline  15.41 seconds    Status  Partially Met      PT LONG TERM GOAL #5   Title  The patient will verbalize understanding of community exercise classes for transition to long term exercise program.    Status  Achieved            Plan - 04/18/18 1226    Clinical Impression Statement  Pt reports 50% overall improvement in Rt LE symptoms since the start of care.  Pt demonstrates improved time with 5x sit to stand and TUG indicating improved balance overall.  Pt has been attending chair exercise 2x/wk with his wife and plans to continue with HEP and chair exercise after discharge.  Pt will receive an injection into his lumbar spine soon and follow-up with MD as needed.      PT Next Visit Plan  D/C PT to HEP    PT Home Exercise Plan  Access Code: [email or web address]     Consulted and Agree with Plan of Care  Patient       Patient will  benefit from skilled therapeutic intervention in order to improve the following deficits and impairments:     Visit Diagnosis: Acute right-sided low back pain with right-sided sciatica  Other abnormalities of gait and mobility  Muscle weakness (generalized)     Problem List Patient Active Problem List   Diagnosis Date Noted  . SDH (subdural hematoma) ([place]) 05/23/2016  . Fall   . Head injury   . History of DVT of lower extremity   . Lip laceration   . Supratherapeutic INR   . Hyperglycemia   . Iron deficiency anemia, unspecified 09/09/2013  . Nonspecific abnormal finding in stool contents 09/09/2013  . CRI (chronic renal insufficiency) 09/03/2013  . Hx of adenomatous colonic polyps 09/03/2013  . CAD (coronary artery disease) of artery bypass graft 09/03/2013  . Acute thromboembolism of deep veins of lower extremity ([place]) 11/01/2009  . DIABETES MELLITUS, TYPE II 10/29/2009  . HYPERLIPIDEMIA 10/29/2009  . ANXIETY 10/29/2009  . Obstructive sleep apnea 10/29/2009  . HYPERTENSION 10/29/2009  . DIVERTICULOSIS, COLON 10/29/2009   PHYSICAL THERAPY DISCHARGE SUMMARY  Visits from Start of Care: 16  Current functional level related to goals / functional outcomes: See above.  Pt has HEP in place to address strength and balance.   Remaining deficits: Rt LE radiculopathy and LE weakness/balance deficits   Education / Equipment: HEP, community exercise programs Plan: Patient agrees to discharge.  Patient goals were partially met. Patient is being discharged due to being pleased with the current functional level.  ?????      Sigurd Sos, PT 04/18/18 12:49 PM  [facility] [address] [place], [place], [address] Phone: [phone]   Fax:  [phone]  Name: GAMAL TODISCO MRN: [id number] Date of Birth: 1928/03/19

## 2018-04-18 NOTE — Patient Instructions (Signed)
Access Code: [email or web address]  URL: [email or web address]  Date: 04/18/2018  Prepared by: Sigurd Sos   Exercises  Standing Hip Abduction - 10 reps - 3 sets - 1x daily - 7x weekly  Standing Hip Extension - 10 reps - 3 sets - 1x daily - 7x weekly

## 2018-05-14 ENCOUNTER — Ambulatory Visit (INDEPENDENT_AMBULATORY_CARE_PROVIDER_SITE_OTHER): Payer: Medicare Other | Admitting: Podiatry

## 2018-05-14 ENCOUNTER — Ambulatory Visit: Payer: Medicare Other | Admitting: Podiatry

## 2018-05-14 ENCOUNTER — Encounter: Payer: Self-pay | Admitting: Podiatry

## 2018-05-14 DIAGNOSIS — B351 Tinea unguium: Secondary | ICD-10-CM | POA: Diagnosis not present

## 2018-05-14 DIAGNOSIS — L84 Corns and callosities: Secondary | ICD-10-CM

## 2018-05-14 DIAGNOSIS — M79676 Pain in unspecified toe(s): Secondary | ICD-10-CM

## 2018-05-14 DIAGNOSIS — D689 Coagulation defect, unspecified: Secondary | ICD-10-CM

## 2018-05-14 DIAGNOSIS — E1142 Type 2 diabetes mellitus with diabetic polyneuropathy: Secondary | ICD-10-CM

## 2018-05-14 NOTE — Patient Instructions (Signed)
Diabetes and Foot Care Diabetes may cause you to have problems because of poor blood supply (circulation) to your feet and legs. This may cause the skin on your feet to become thinner, break easier, and heal more slowly. Your skin may become dry, and the skin may peel and crack. You may also have nerve damage in your legs and feet causing decreased feeling in them. You may not notice minor injuries to your feet that could lead to infections or more serious problems. Taking care of your feet is one of the most important things you can do for yourself. Follow these instructions at home:  Wear shoes at all times, even in the house. Do not go barefoot. Bare feet are easily injured.  Check your feet daily for blisters, cuts, and redness. If you cannot see the bottom of your feet, use a mirror or ask someone for help.  Wash your feet with warm water (do not use hot water) and mild soap. Then pat your feet and the areas between your toes until they are completely dry. Do not soak your feet as this can dry your skin.  Apply a moisturizing lotion or petroleum jelly (that does not contain alcohol and is unscented) to the skin on your feet and to dry, brittle toenails. Do not apply lotion between your toes.  Trim your toenails straight across. Do not dig under them or around the cuticle. File the edges of your nails with an emery board or nail file.  Do not cut corns or calluses or try to remove them with medicine.  Wear clean socks or stockings every day. Make sure they are not too tight. Do not wear knee-high stockings since they may decrease blood flow to your legs.  Wear shoes that fit properly and have enough cushioning. To break in new shoes, wear them for just a few hours a day. This prevents you from injuring your feet. Always look in your shoes before you put them on to be sure there are no objects inside.  Do not cross your legs. This may decrease the blood flow to your feet.  If you find a  minor scrape, cut, or break in the skin on your feet, keep it and the skin around it clean and dry. These areas may be cleansed with mild soap and water. Do not cleanse the area with peroxide, alcohol, or iodine.  When you remove an adhesive bandage, be sure not to damage the skin around it.  If you have a wound, look at it several times a day to make sure it is healing.  Do not use heating pads or hot water bottles. They may burn your skin. If you have lost feeling in your feet or legs, you may not know it is happening until it is too late.  Make sure your health care provider performs a complete foot exam at least annually or more often if you have foot problems. Report any cuts, sores, or bruises to your health care provider immediately. Contact a health care provider if:  You have an injury that is not healing.  You have cuts or breaks in the skin.  You have an ingrown nail.  You notice redness on your legs or feet.  You feel burning or tingling in your legs or feet.  You have pain or cramps in your legs and feet.  Your legs or feet are numb.  Your feet always feel cold. Get help right away if:  There is increasing   redness, swelling, or pain in or around a wound.  There is a red line that goes up your leg.  Pus is coming from a wound.  You develop a fever or as directed by your health care provider.  You notice a bad smell coming from an ulcer or wound. This information is not intended to replace advice given to you by your health care provider. Make sure you discuss any questions you have with your health care provider. Document Released: 05/12/2000 Document Revised: 10/21/2015 Document Reviewed: 10/22/2012 Elsevier Interactive Patient Education  2017 Elsevier Inc. Onychomycosis/Fungal Toenails  WHAT IS IT? An infection that lies within the keratin of your nail plate that is caused by a fungus.  WHY ME? Fungal infections affect all ages, sexes, races, and creeds.   There may be many factors that predispose you to a fungal infection such as age, coexisting medical conditions such as diabetes, or an autoimmune disease; stress, medications, fatigue, genetics, etc.  Bottom line: fungus thrives in a warm, moist environment and your shoes offer such a location.  IS IT CONTAGIOUS? Theoretically, yes.  You do not want to share shoes, nail clippers or files with someone who has fungal toenails.  Walking around barefoot in the same room or sleeping in the same bed is unlikely to transfer the organism.  It is important to realize, however, that fungus can spread easily from one nail to the next on the same foot.  HOW DO WE TREAT THIS?  There are several ways to treat this condition.  Treatment may depend on many factors such as age, medications, pregnancy, liver and kidney conditions, etc.  It is best to ask your doctor which options are available to you.  1. No treatment.   Unlike many other medical concerns, you can live with this condition.  However for many people this can be a painful condition and may lead to ingrown toenails or a bacterial infection.  It is recommended that you keep the nails cut short to help reduce the amount of fungal nail. 2. Topical treatment.  These range from herbal remedies to prescription strength nail lacquers.  About 40-50% effective, topicals require twice daily application for approximately 9 to 12 months or until an entirely new nail has grown out.  The most effective topicals are medical grade medications available through physicians offices. 3. Oral antifungal medications.  With an 80-90% cure rate, the most common oral medication requires 3 to 4 months of therapy and stays in your system for a year as the new nail grows out.  Oral antifungal medications do require blood work to make sure it is a safe drug for you.  A liver function panel will be performed prior to starting the medication and after the first month of treatment.  It is  important to have the blood work performed to avoid any harmful side effects.  In general, this medication safe but blood work is required. 4. Laser Therapy.  This treatment is performed by applying a specialized laser to the affected nail plate.  This therapy is noninvasive, fast, and non-painful.  It is not covered by insurance and is therefore, out of pocket.  The results have been very good with a 80-95% cure rate.  The [facility] is the only practice in the area to offer this therapy. 5. Permanent Nail Avulsion.  Removing the entire nail so that a new nail will not grow back. 

## 2018-05-23 DIAGNOSIS — E538 Deficiency of other specified B group vitamins: Secondary | ICD-10-CM | POA: Diagnosis not present

## 2018-06-13 DIAGNOSIS — E538 Deficiency of other specified B group vitamins: Secondary | ICD-10-CM | POA: Diagnosis not present

## 2018-06-13 DIAGNOSIS — E1129 Type 2 diabetes mellitus with other diabetic kidney complication: Secondary | ICD-10-CM | POA: Diagnosis not present

## 2018-06-13 DIAGNOSIS — R82998 Other abnormal findings in urine: Secondary | ICD-10-CM | POA: Diagnosis not present

## 2018-06-13 DIAGNOSIS — E7849 Other hyperlipidemia: Secondary | ICD-10-CM | POA: Diagnosis not present

## 2018-06-13 DIAGNOSIS — E559 Vitamin D deficiency, unspecified: Secondary | ICD-10-CM | POA: Diagnosis not present

## 2018-06-15 ENCOUNTER — Encounter: Payer: Self-pay | Admitting: Podiatry

## 2018-06-15 DIAGNOSIS — G629 Polyneuropathy, unspecified: Secondary | ICD-10-CM | POA: Insufficient documentation

## 2018-06-15 NOTE — Progress Notes (Signed)
Subjective: Alejandro Russell presents for preventative foot care with h/o diabetes, diabetic neuropathy. He is seen for painful, discolored, thick toenails and painful calluses and corn which interfere with activities of daily living. Pain is aggravated when wearing enclosed shoe gear and is relieved with periodic professional debridement.  Marton Redwood, MD is his PCP.   Current Outpatient Medications:  .  alendronate (FOSAMAX) 70 MG tablet, Take 70 mg by mouth once a week., Disp: , Rfl: 3 .  B Complex-C (SUPER B COMPLEX PO), Take 1 capsule by mouth daily., Disp: , Rfl:  .  buPROPion (WELLBUTRIN XL) 300 MG 24 hr tablet, Take 300 mg by mouth every morning., Disp: , Rfl:  .  Cholecalciferol (VITAMIN D3 PO), Take 1 tablet by mouth daily., Disp: , Rfl:  .  [employer] EAR DROPS 6.5 % OTIC solution, ADMINISTER 5 DROPS TO THE RIGHT EAR 2 (TWO) TIMES A DAY FOR 4 DAYS., Disp: , Rfl: 0 .  cyanocobalamin (,VITAMIN B-12,) 1000 MCG/ML injection, Inject 1,000 mcg into the muscle every 30 (thirty) days., Disp: , Rfl:  .  DENTA 5000 PLUS 1.1 % CREA dental cream, Place 1 application onto teeth at bedtime. , Disp: , Rfl: 5 .  donepezil (ARICEPT) 10 MG tablet, 1 PILL DAILY AT BEDTIME, THEN INCREASE TO 10 MG DAILY, Disp: , Rfl: 2 .  ELIQUIS 5 MG TABS tablet, Take 5 mg by mouth 2 (two) times daily., Disp: , Rfl: 11 .  ergocalciferol (VITAMIN D2) 50000 units capsule, Take 50,000 Units by mouth once a week., Disp: , Rfl:  .  erythromycin ophthalmic ointment, APPLY 2CM RIBBON TO EYELID TWICE A DAY X 7 DAYS, Disp: , Rfl: 0 .  fenofibrate (TRICOR) 145 MG tablet, Take 145 mg by mouth every evening. , Disp: , Rfl:  .  iron polysaccharides (NIFEREX) 150 MG capsule, Take 150 mg by mouth daily., Disp: , Rfl:  .  isosorbide mononitrate (IMDUR) 60 MG 24 hr tablet, Take 60 mg by mouth every morning. , Disp: , Rfl:  .  metFORMIN (GLUCOPHAGE) 500 MG tablet, Take 500 mg by mouth 2 (two) times daily.  , Disp: , Rfl:  .  metoprolol  (LOPRESSOR) 100 MG tablet, Take 50 mg by mouth 2 (two) times daily.  , Disp: , Rfl:  .  Misc Natural Products (TURMERIC CURCUMIN) CAPS, Take 2 capsules by mouth daily., Disp: , Rfl:  .  niacin (NIASPAN) 1000 MG CR tablet, Take 1,000 mg by mouth at bedtime.  , Disp: , Rfl:  .  Omega-3 Fatty Acids (FISH OIL PO), Take 1 capsule by mouth daily. , Disp: , Rfl:  .  PARoxetine (PAXIL) 20 MG tablet, Take 20 mg by mouth at bedtime. , Disp: , Rfl:  .  ramipril (ALTACE) 2.5 MG capsule, Take 2.5 mg by mouth every evening. , Disp: , Rfl:  .  rosuvastatin (CRESTOR) 40 MG tablet, Take 40 mg by mouth daily., Disp: , Rfl:  .  Vitamin D, Ergocalciferol, (DRISDOL) 50000 units CAPS capsule, Take 50,000 Units by mouth every Sunday., Disp: , Rfl:   No Known Allergies  Vascular Examination: Capillary refill time immediate x 10 digits Dorsalis pedis and Posterior tibial pulses present b/l No digital hair x 10 digits Skin temperature gradient WNL b/l  Dermatological Examination: Skin with normal turgor, texture and tone b/l  Toenails 1-5 b/l discolored, thick, dystrophic with subungual debris and pain with palpation to nailbeds due to thickness of nails.  Hyperkeratotic lesions present submetataral heads 1  b/l, 5 b/l, 2 left foot, 3 left foot and dorsal PIPJ of left 5th digit  Musculoskeletal: Muscle strength 5/5 to all LE muscle groups  Neurological: Sensation diminished with 10 gram monofilament. Vibratory sensation diminished  Assessment: 1. Painful onychomycosis toenails 1-5 b/l 2. Corn PIPJ of left 5th digit and calluses submetataral heads 1 b/l, 5 b/l, 2 left foot, 3 left foot 3. NIDDM with Diabetic neuropathy 4. Coagulation defect  Plan: 1. Continue diabetic foot care principles.  2. Toenails 1-5 b/l were debrided in length and girth without iatrogenic bleeding. 5. Hyperkeratotic lesion pared with sterile blade submetataral heads 1 b/l, 5 b/l, 2 left foot, 3 left foot 3. Patient to continue  soft, supportive shoe gear 4. Patient to report any pedal injuries to medical professional  5. Follow up 3 months. Patient/POA to call should there be a concern in the interim.

## 2018-06-20 DIAGNOSIS — Z0000 Encounter for general adult medical examination without abnormal findings: Secondary | ICD-10-CM | POA: Diagnosis not present

## 2018-06-20 DIAGNOSIS — I10 Essential (primary) hypertension: Secondary | ICD-10-CM | POA: Diagnosis not present

## 2018-06-20 DIAGNOSIS — N183 Chronic kidney disease, stage 3 (moderate): Secondary | ICD-10-CM | POA: Diagnosis not present

## 2018-06-20 DIAGNOSIS — I8291 Chronic embolism and thrombosis of unspecified vein: Secondary | ICD-10-CM | POA: Diagnosis not present

## 2018-06-20 DIAGNOSIS — Z7901 Long term (current) use of anticoagulants: Secondary | ICD-10-CM | POA: Insufficient documentation

## 2018-06-20 DIAGNOSIS — E7849 Other hyperlipidemia: Secondary | ICD-10-CM | POA: Diagnosis not present

## 2018-06-20 DIAGNOSIS — Z6823 Body mass index (BMI) 23.0-23.9, adult: Secondary | ICD-10-CM | POA: Diagnosis not present

## 2018-06-20 DIAGNOSIS — Z1331 Encounter for screening for depression: Secondary | ICD-10-CM | POA: Diagnosis not present

## 2018-06-20 DIAGNOSIS — I2510 Atherosclerotic heart disease of native coronary artery without angina pectoris: Secondary | ICD-10-CM | POA: Diagnosis not present

## 2018-06-20 DIAGNOSIS — I252 Old myocardial infarction: Secondary | ICD-10-CM | POA: Diagnosis not present

## 2018-06-20 DIAGNOSIS — E1129 Type 2 diabetes mellitus with other diabetic kidney complication: Secondary | ICD-10-CM | POA: Diagnosis not present

## 2018-06-20 DIAGNOSIS — E1140 Type 2 diabetes mellitus with diabetic neuropathy, unspecified: Secondary | ICD-10-CM | POA: Diagnosis not present

## 2018-06-25 DIAGNOSIS — E538 Deficiency of other specified B group vitamins: Secondary | ICD-10-CM | POA: Diagnosis not present

## 2018-07-30 DIAGNOSIS — E538 Deficiency of other specified B group vitamins: Secondary | ICD-10-CM | POA: Diagnosis not present

## 2018-08-13 ENCOUNTER — Ambulatory Visit: Payer: Medicare Other | Admitting: Podiatry

## 2018-08-14 ENCOUNTER — Ambulatory Visit (INDEPENDENT_AMBULATORY_CARE_PROVIDER_SITE_OTHER): Payer: Medicare Other | Admitting: Podiatry

## 2018-08-14 ENCOUNTER — Encounter: Payer: Self-pay | Admitting: Podiatry

## 2018-08-14 ENCOUNTER — Other Ambulatory Visit: Payer: Self-pay

## 2018-08-14 DIAGNOSIS — E1142 Type 2 diabetes mellitus with diabetic polyneuropathy: Secondary | ICD-10-CM

## 2018-08-14 DIAGNOSIS — M79676 Pain in unspecified toe(s): Secondary | ICD-10-CM | POA: Diagnosis not present

## 2018-08-14 DIAGNOSIS — B351 Tinea unguium: Secondary | ICD-10-CM

## 2018-08-14 DIAGNOSIS — D689 Coagulation defect, unspecified: Secondary | ICD-10-CM

## 2018-08-14 DIAGNOSIS — E119 Type 2 diabetes mellitus without complications: Secondary | ICD-10-CM

## 2018-08-14 NOTE — Progress Notes (Signed)
Patient ID: Alejandro Russell, male   DOB: [birth year], 85 y.o.   MRN: [id number] Complaint:  Visit Type: Patient returns to my office for continued preventative foot care services. Complaint: Patient states" my nails have grown long and thick and become painful to walk and wear shoes" Patient has been diagnosed with pre Diabetes.Marland Kitchen He presents for preventative foot care services. No changes to ROS  Podiatric Exam: Vascular: dorsalis pedis and posterior tibial pulses are palpable bilateral. Capillary return is immediate. Temperature gradient is WNL. Skin turgor WNL  Sensorium: Normal Semmes Weinstein monofilament test. Normal tactile sensation bilaterally. Nail Exam: Pt has thick disfigured discolored nails with subungual debris noted bilateral entire nail hallux through fifth toenails Ulcer Exam: There is no evidence of ulcer or pre-ulcerative changes or infection. Orthopedic Exam: Muscle tone and strength are WNL. No limitations in general ROM. No crepitus or effusions noted. Foot type and digits show no abnormalities. Bony prominences are unremarkable. Skin: No Porokeratosis. No infection or ulcers.  Asymptomatic callus  B/L.  Diagnosis:  Tinea unguium, Pain in right toe, pain in left toes  Treatment & Plan Procedures and Treatment: Consent by patient was obtained for treatment procedures. The patient understood the discussion of treatment and procedures well. All questions were answered thoroughly reviewed. Debridement of mycotic and hypertrophic toenails, 1 through 5 bilateral and clearing of subungual debris. No ulceration, no infection noted. Return Visit-Office Procedure: Patient instructed to return to the office for a follow up visit 3 months for continued evaluation and treatment.   Gardiner Barefoot DPM

## 2018-09-03 DIAGNOSIS — E538 Deficiency of other specified B group vitamins: Secondary | ICD-10-CM | POA: Diagnosis not present

## 2018-11-13 ENCOUNTER — Encounter: Payer: Self-pay | Admitting: Podiatry

## 2018-11-13 ENCOUNTER — Other Ambulatory Visit: Payer: Self-pay

## 2018-11-13 ENCOUNTER — Ambulatory Visit (INDEPENDENT_AMBULATORY_CARE_PROVIDER_SITE_OTHER): Payer: Medicare Other | Admitting: Podiatry

## 2018-11-13 DIAGNOSIS — B351 Tinea unguium: Secondary | ICD-10-CM

## 2018-11-13 DIAGNOSIS — E119 Type 2 diabetes mellitus without complications: Secondary | ICD-10-CM

## 2018-11-13 DIAGNOSIS — M79674 Pain in right toe(s): Secondary | ICD-10-CM | POA: Diagnosis not present

## 2018-11-13 DIAGNOSIS — M79675 Pain in left toe(s): Secondary | ICD-10-CM | POA: Diagnosis not present

## 2018-11-13 NOTE — Progress Notes (Signed)
Patient ID: Alejandro Russell, male   DOB: February 10, 1928, 85 y.o.   MRN: [id number] Complaint:  Visit Type: Patient returns to my office for continued preventative foot care services. Complaint: Patient states" my nails have grown long and thick and become painful to walk and wear shoes" Patient has been diagnosed with pre Diabetes.Marland Kitchen He presents for preventative foot care services. No changes to ROS  Podiatric Exam: Vascular: dorsalis pedis and posterior tibial pulses are palpable bilateral. Capillary return is immediate. Temperature gradient is WNL. Skin turgor WNL  Sensorium: Normal Semmes Weinstein monofilament test. Normal tactile sensation bilaterally. Nail Exam: Pt has thick disfigured discolored nails with subungual debris noted bilateral entire nail hallux through fifth toenails Ulcer Exam: There is no evidence of ulcer or pre-ulcerative changes or infection. Orthopedic Exam: Muscle tone and strength are WNL. No limitations in general ROM. No crepitus or effusions noted. Foot type and digits show no abnormalities. Bony prominences are unremarkable. Skin: No Porokeratosis. No infection or ulcers.  Asymptomatic callus  B/L.  Diagnosis:  Onychomycosis  , Pain in right toe, pain in left toes  Treatment & Plan Procedures and Treatment: Consent by patient was obtained for treatment procedures. The patient understood the discussion of treatment and procedures well. All questions were answered thoroughly reviewed. Debridement of mycotic and hypertrophic toenails, 1 through 5 bilateral and clearing of subungual debris. No ulceration, no infection noted. Return Visit-Office Procedure: Patient instructed to return to the office for a follow up visit 3 months for continued evaluation and treatment.   Gardiner Barefoot DPM

## 2018-12-17 DIAGNOSIS — F0280 Dementia in other diseases classified elsewhere without behavioral disturbance: Secondary | ICD-10-CM | POA: Diagnosis not present

## 2018-12-17 DIAGNOSIS — E1129 Type 2 diabetes mellitus with other diabetic kidney complication: Secondary | ICD-10-CM | POA: Diagnosis not present

## 2018-12-17 DIAGNOSIS — E1140 Type 2 diabetes mellitus with diabetic neuropathy, unspecified: Secondary | ICD-10-CM | POA: Diagnosis not present

## 2018-12-17 DIAGNOSIS — E538 Deficiency of other specified B group vitamins: Secondary | ICD-10-CM | POA: Diagnosis not present

## 2018-12-17 DIAGNOSIS — I129 Hypertensive chronic kidney disease with stage 1 through stage 4 chronic kidney disease, or unspecified chronic kidney disease: Secondary | ICD-10-CM | POA: Diagnosis not present

## 2018-12-17 DIAGNOSIS — M8580 Other specified disorders of bone density and structure, unspecified site: Secondary | ICD-10-CM | POA: Diagnosis not present

## 2018-12-17 DIAGNOSIS — I2510 Atherosclerotic heart disease of native coronary artery without angina pectoris: Secondary | ICD-10-CM | POA: Diagnosis not present

## 2018-12-17 DIAGNOSIS — Z7901 Long term (current) use of anticoagulants: Secondary | ICD-10-CM | POA: Diagnosis not present

## 2018-12-17 DIAGNOSIS — N183 Chronic kidney disease, stage 3 (moderate): Secondary | ICD-10-CM | POA: Diagnosis not present

## 2018-12-17 DIAGNOSIS — E785 Hyperlipidemia, unspecified: Secondary | ICD-10-CM | POA: Diagnosis not present

## 2018-12-18 DIAGNOSIS — E538 Deficiency of other specified B group vitamins: Secondary | ICD-10-CM | POA: Diagnosis not present

## 2019-01-22 ENCOUNTER — Encounter: Payer: Self-pay | Admitting: Podiatry

## 2019-01-22 ENCOUNTER — Other Ambulatory Visit: Payer: Self-pay

## 2019-01-22 ENCOUNTER — Ambulatory Visit (INDEPENDENT_AMBULATORY_CARE_PROVIDER_SITE_OTHER): Payer: Medicare Other | Admitting: Podiatry

## 2019-01-22 DIAGNOSIS — D689 Coagulation defect, unspecified: Secondary | ICD-10-CM

## 2019-01-22 DIAGNOSIS — M79674 Pain in right toe(s): Secondary | ICD-10-CM

## 2019-01-22 DIAGNOSIS — B351 Tinea unguium: Secondary | ICD-10-CM | POA: Diagnosis not present

## 2019-01-22 DIAGNOSIS — M79675 Pain in left toe(s): Secondary | ICD-10-CM | POA: Diagnosis not present

## 2019-01-22 DIAGNOSIS — E119 Type 2 diabetes mellitus without complications: Secondary | ICD-10-CM

## 2019-01-22 NOTE — Progress Notes (Signed)
Patient ID: Alejandro Russell, male   DOB: 12/24/1927, 85 y.o.   MRN: [id number] Complaint:  Visit Type: Patient returns to my office for continued preventative foot care services. Complaint: Patient states" my nails have grown long and thick and become painful to walk and wear shoes" Patient has been diagnosed with pre Diabetes.. He presents for preventative foot care services. No changes to ROS  Podiatric Exam: Vascular: dorsalis pedis and posterior tibial pulses are palpable bilateral. Capillary return is immediate. Temperature gradient is WNL. Skin turgor WNL  Sensorium: Normal Semmes Weinstein monofilament test. Normal tactile sensation bilaterally. Nail Exam: Pt has thick disfigured discolored nails with subungual debris noted bilateral entire nail hallux through fifth toenails Ulcer Exam: There is no evidence of ulcer or pre-ulcerative changes or infection. Orthopedic Exam: Muscle tone and strength are WNL. No limitations in general ROM. No crepitus or effusions noted. Foot type and digits show no abnormalities. Bony prominences are unremarkable. Skin: No Porokeratosis. No infection or ulcers.  Asymptomatic callus  B/L.  Diagnosis:  Onychomycosis  , Pain in right toe, pain in left toes  Treatment & Plan Procedures and Treatment: Consent by patient was obtained for treatment procedures. The patient understood the discussion of treatment and procedures well. All questions were answered thoroughly reviewed. Debridement of mycotic and hypertrophic toenails, 1 through 5 bilateral and clearing of subungual debris. No ulceration, no infection noted. Return Visit-Office Procedure: Patient instructed to return to the office for a follow up visit 3 months for continued evaluation and treatment.   Juliett Eastburn DPM 

## 2019-01-29 ENCOUNTER — Ambulatory Visit: Payer: Medicare Other | Admitting: Podiatry

## 2019-01-30 DIAGNOSIS — Z23 Encounter for immunization: Secondary | ICD-10-CM | POA: Diagnosis not present

## 2019-02-17 DIAGNOSIS — Z85828 Personal history of other malignant neoplasm of skin: Secondary | ICD-10-CM | POA: Diagnosis not present

## 2019-02-17 DIAGNOSIS — L218 Other seborrheic dermatitis: Secondary | ICD-10-CM | POA: Diagnosis not present

## 2019-02-17 DIAGNOSIS — Z85820 Personal history of malignant melanoma of skin: Secondary | ICD-10-CM | POA: Diagnosis not present

## 2019-02-17 DIAGNOSIS — L738 Other specified follicular disorders: Secondary | ICD-10-CM | POA: Diagnosis not present

## 2019-02-17 DIAGNOSIS — L570 Actinic keratosis: Secondary | ICD-10-CM | POA: Diagnosis not present

## 2019-02-19 ENCOUNTER — Ambulatory Visit: Payer: Medicare Other | Admitting: Podiatry

## 2019-02-23 IMAGING — MR MR LUMBAR SPINE W/O CM
4 of 5 series · 18 of 48 positions shown · non-contrast
Comparison: None.

Addendum:
CLINICAL DATA: Low back pain radiating to RIGHT buttock and leg
since January 2018. History of colon cancer.

EXAM:
MRI LUMBAR SPINE WITHOUT CONTRAST
TECHNIQUE: Multiplanar, multisequence MR imaging of the lumbar spine was
performed. No intravenous contrast was administered.

[Series 3: T1 · sagittal · 4.0mm · 0.51mm/px · 3 of 12 slices shown (1 of 2)]
[im 1/12]
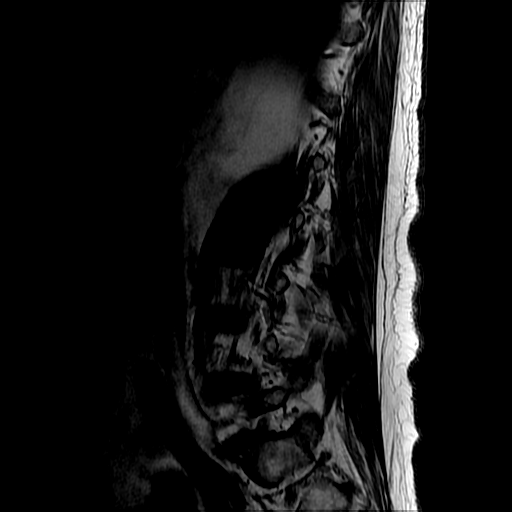
[im 8/12]
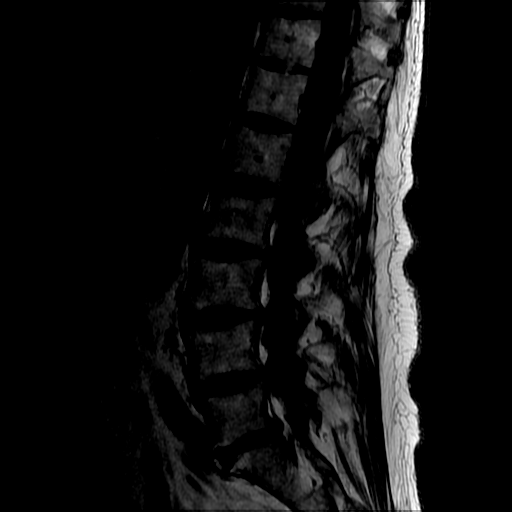
[im 12/12]
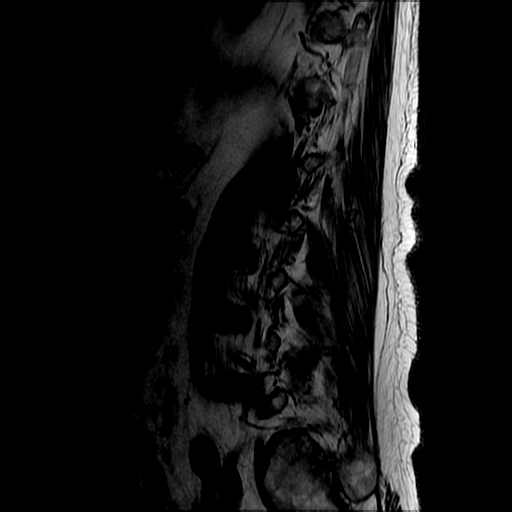

[Series 4: T2 post-contrast · sagittal · 4.0mm · 0.51mm/px · 5 of 12 slices shown]
[im 1/12]
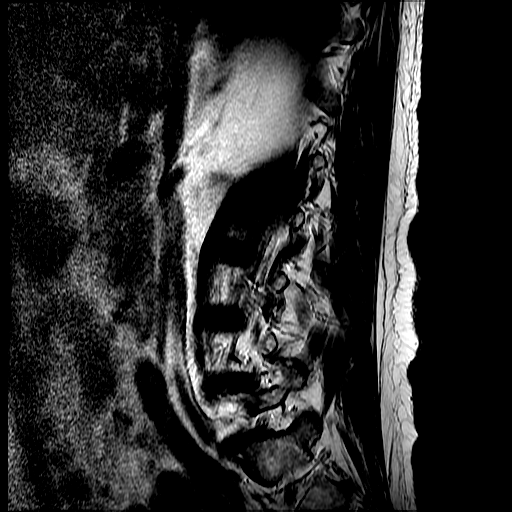
[im 3/12]
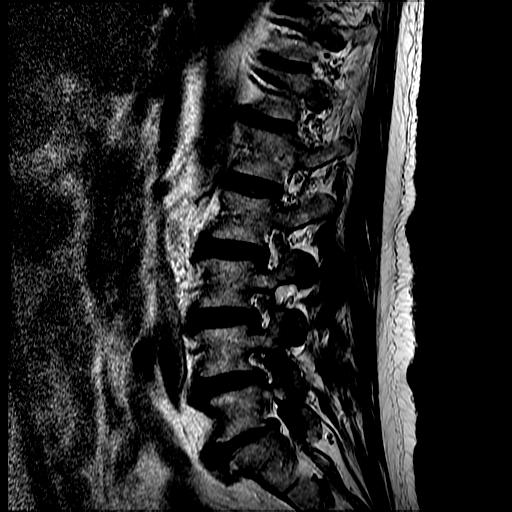
[im 6/12]
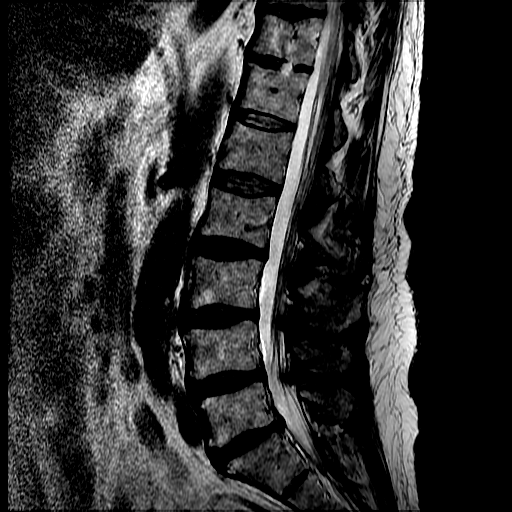
[im 9/12]
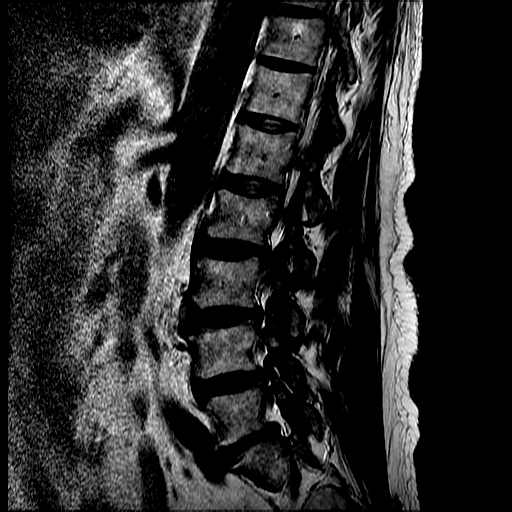
[im 12/12]
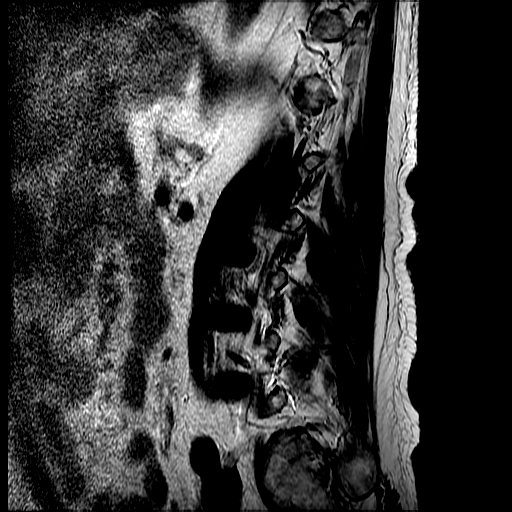

[Series 6: T2 · axial · 4.0mm · 0.39mm/px · z∈[-74,+117]mm · 7 of 43 slices shown]
[im 3/43]
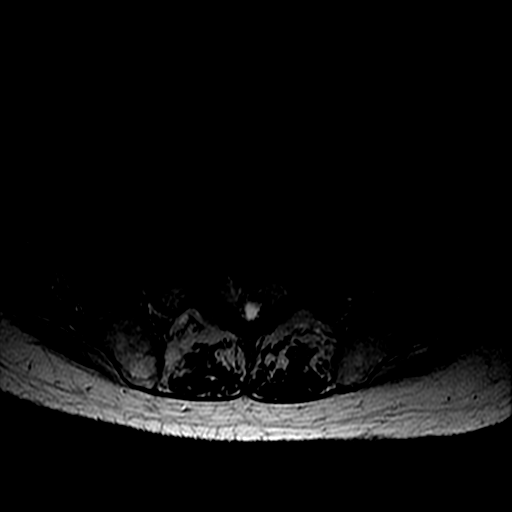
[im 6/43]
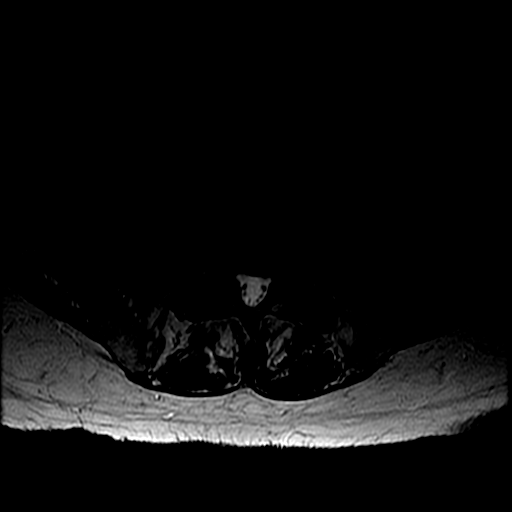
[im 8/43]
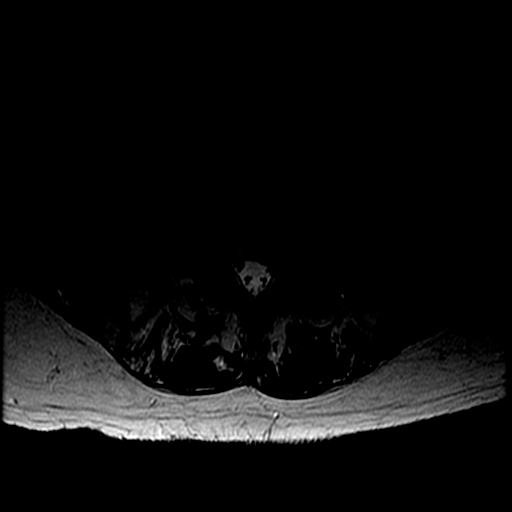
[im 14/43]
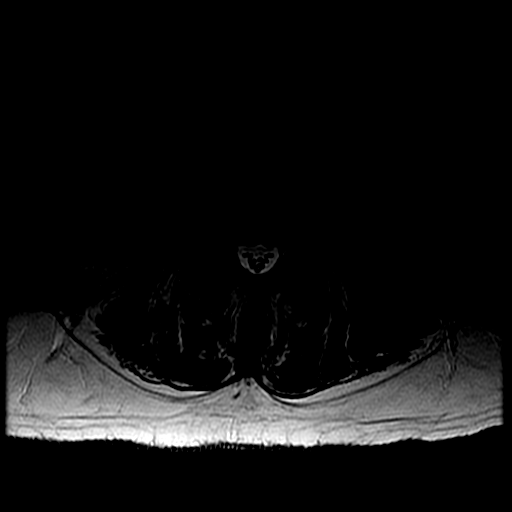
[im 19/43]
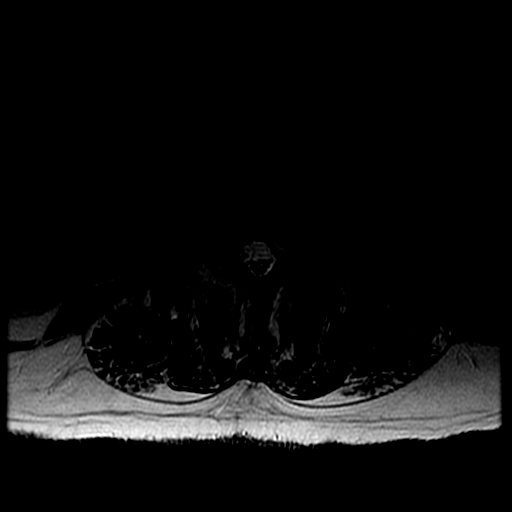
[im 22/43]
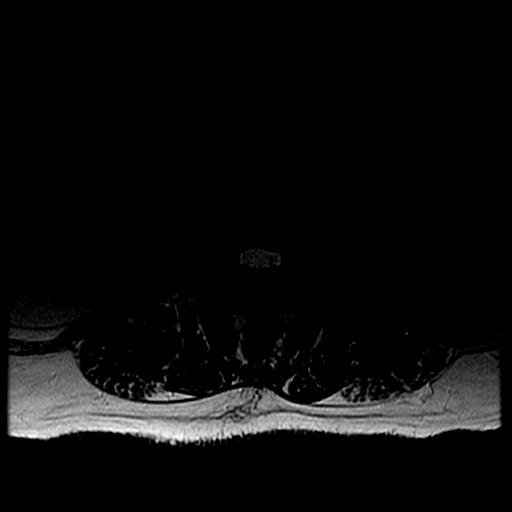
[im 37/43]
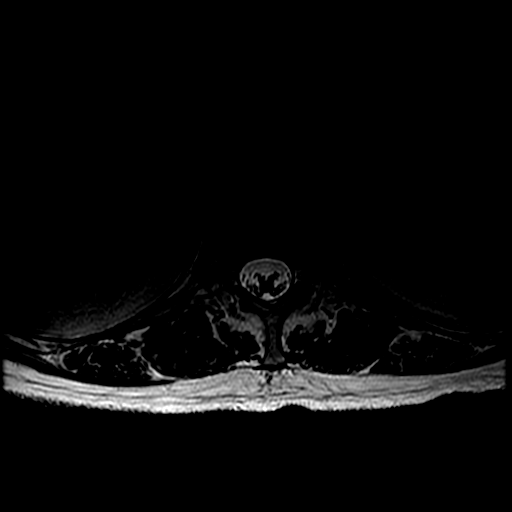

[Series 7: T1 · axial · 4.0mm · 0.39mm/px · z∈[-59,+117]mm · 3 of 43 slices shown (2 of 2)]
[im 6/43]
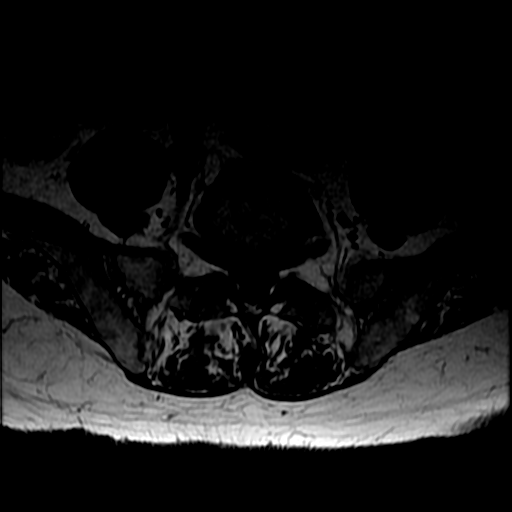
[im 22/43]
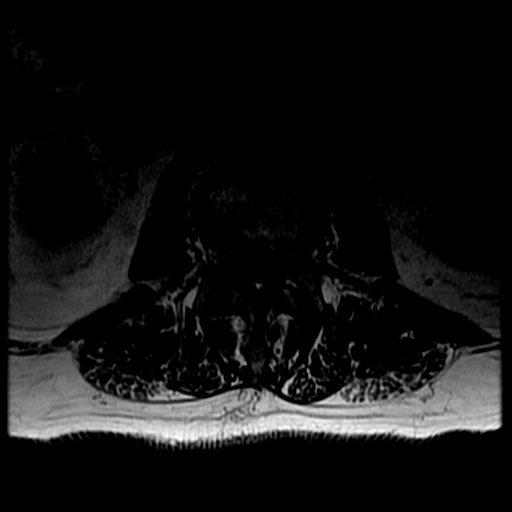
[im 37/43]
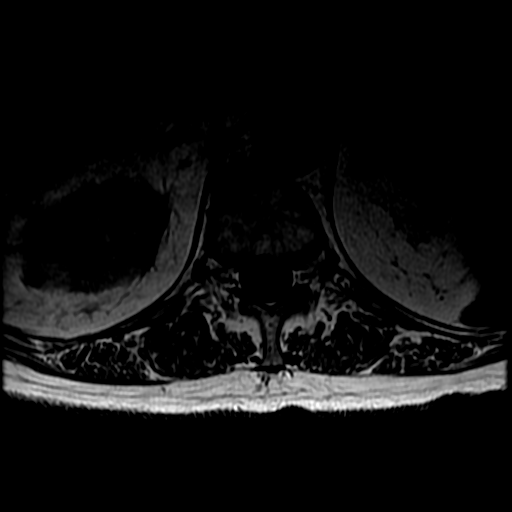

[18 of 48 positions shown; findings below may reference images not displayed]

FINDINGS: SEGMENTATION: For the purposes of this report, the last well-formed
intervertebral disc is reported as L5-S1.

ALIGNMENT: Maintained lumbar lordosis. No malalignment.

VERTEBRAE:Vertebral bodies are intact. Moderate to severe L5-S1 disc
height loss, moderate L4-5 and mild L2-3 and L3-4. Disc desiccation
L2-3 through L5-S1 with mild-to-moderate subacute on chronic
discogenic endplate changes. No acute or abnormal bone marrow
signal. Old mild T11 compression fracture with less than 25%
superior endplate height loss.

CONUS MEDULLARIS AND CAUDA EQUINA: Conus medullaris terminates at
T12-L1 and demonstrates normal morphology and signal
characteristics. Cauda equina is normal.

PARASPINAL AND OTHER SOFT TISSUES nonacute. T2 bright cyst bilateral
kidneys, incompletely evaluated. ES: Included prevertebral and
paraspinal soft tissues are normal.

DISC LEVELS:

T11-12 through L1-2: No disc bulge, canal stenosis nor neural
foraminal narrowing. Mild facet arthropathy.

L2-3: Annular bulging, mild facet arthropathy and ligamentum flavum
redundancy without canal stenosis or neural foraminal narrowing.

L3-4: Annular bulging, mild facet arthropathy and ligamentum flavum
redundancy. Mild canal stenosis. Mild bilateral caudal neural
foraminal narrowing.

L4-5: Annular bulging. Moderate to severe facet arthropathy and
ligamentum flavum redundancy with trace reactive effusion. Moderate
canal stenosis, narrowed RIGHT lateral recess may affect the
traversing RIGHT L5 nerve. 4 mm rounded low signal RIGHT lateral
recess axial image 34) most compatible with RIGHT L4 nerve and slice
selection. Mild to moderate bilateral neural foraminal narrowing.

L5-S1: Small broad-based disc bulge asymmetric to the RIGHT. Mild
facet arthropathy and ligamentum flavum redundancy without canal
stenosis. Minimal RIGHT neural foraminal narrowing.
IMPRESSION: 1. No acute fracture, malalignment or acute osseous process. Old
mild T11 compression fracture.
2. Degenerative change of the lumbar spine resulting in moderate
canal stenosis L4-5, mild canal stenosis L3-4. Narrowed L4-5 lateral
recess may affect the traversing RIGHT L5 nerve.
3. Neural foraminal narrowing L3-4 through L5-S1: Mild to moderate
at L4-5.

ADDENDUM:
Study reviewed over the telephone with Dr. Quirijn Amazigh at 0669
hours today.

We discussed that an alternate diagnosis at the right lateral recess
of L4-L5 is a small caudal disc extrusion superimposed on chronic
disc and posterior element degeneration at that level.

*** End of Addendum ***

## 2019-03-11 DIAGNOSIS — E119 Type 2 diabetes mellitus without complications: Secondary | ICD-10-CM | POA: Diagnosis not present

## 2019-03-11 DIAGNOSIS — Z961 Presence of intraocular lens: Secondary | ICD-10-CM | POA: Diagnosis not present

## 2019-03-11 DIAGNOSIS — Z7984 Long term (current) use of oral hypoglycemic drugs: Secondary | ICD-10-CM | POA: Diagnosis not present

## 2019-03-17 ENCOUNTER — Other Ambulatory Visit: Payer: Self-pay

## 2019-03-17 ENCOUNTER — Ambulatory Visit (INDEPENDENT_AMBULATORY_CARE_PROVIDER_SITE_OTHER): Payer: Medicare Other | Admitting: Internal Medicine

## 2019-03-17 ENCOUNTER — Encounter: Payer: Self-pay | Admitting: Internal Medicine

## 2019-03-17 VITALS — BP 122/60 | HR 56 | Temp 97.1°F | Ht 72.0 in | Wt 156.4 lb

## 2019-03-17 DIAGNOSIS — S065XAA Traumatic subdural hemorrhage with loss of consciousness status unknown, initial encounter: Secondary | ICD-10-CM

## 2019-03-17 DIAGNOSIS — G4733 Obstructive sleep apnea (adult) (pediatric): Secondary | ICD-10-CM

## 2019-03-17 DIAGNOSIS — Z9989 Dependence on other enabling machines and devices: Secondary | ICD-10-CM | POA: Diagnosis not present

## 2019-03-17 DIAGNOSIS — S065X9A Traumatic subdural hemorrhage with loss of consciousness of unspecified duration, initial encounter: Secondary | ICD-10-CM

## 2019-03-17 NOTE — Progress Notes (Signed)
Subjective:    Patient ID: Alejandro Russell, male    DOB: [birth year], 85 y.o.   MRN: [id number]  HPI  male former smoker followed for OSA, complicated by DM 2, HBP, DVT, CAD, DM 2, SDH NPSG - 2000, AHI 50/ hr  ---------------------------------------------------------------------------------------------    03/04/2018- 85 year old male former smoker followed for OSA, complicated by DM 2, HBP, DVT,  CAD/ MI, SDH CPAP 16/ [facility]>> change existing machine to auto 10-20 -----Follows for: OSA, having trouble with mask at night, takes his mask off sometimes, for naps he uses his CPAP Here with his wife who says pattern is the same as last year.  He put CPAP on every night with her supervision but as he falls asleep he begins filling with it and takes it off repeatedly in his sleep.  She make sure he wears it during naps in the daytime.  He feels it is comfortable.  He is not sure it is helping him.  His wife feels better that he wears it at least some.  This machine is 83 years old. Download 18% compliance AHI 21.4/hour with large leak.  Download results reviewed with them.  03/17/2019- 85 year old male former smoker followed for OSA, complicated by DM 2, HBP, DVT,  CAD/ MI, SDH CPAP auto 10-20/ [facility] Problem noted last visit,  that he fiddled with CPAP mask in his sleep and took it off. -----OSA on CPAP, DME: [facility]; pt spouse states pt no longer uses CPAP, spouse denies pt snoring; however, states he does breathe with mouth open  Wife gave up trying to have him keep CPAP on- no longer using. For insomnia he takes Tylenol PM about 1x/ month. Naps some, but wife watches this so he can sleep at night. Note 50 lb weight loss in last 2 years. Has had flu vax. Denies daytime breathing discomfort or cough.  ROS-see HPI   + = positive Constitutional:    +weight loss, night sweats, fevers, chills, fatigue, lassitude. HEENT:    headaches, difficulty swallowing, tooth/dental problems, sore throat,     sneezing, itching, ear ache, nasal congestion, post nasal drip, snoring CV:    chest pain, orthopnea, PND, swelling in lower extremities, anasarca,                                                       dizziness, palpitations Resp:   shortness of breath with exertion or at rest.                productive cough,   non-productive cough, coughing up of blood.              change in color of mucus.  wheezing.   Skin:    rash or lesions. GI:  No-   heartburn, indigestion, abdominal pain, nausea, vomiting, GU:  MS:   joint pain, stiffness,  Neuro-     nothing unusual Psych:  change in mood or affect.  depression or anxiety.   memory loss.    Objective:  OBJ- Physical Exam General- Alert, Oriented, Affect-appropriate, Distress- none acute, +Elderly/ vague, + thin Skin- rash-none, lesions- none, excoriation- none Lymphadenopathy- none Head- atraumatic            Eyes- Gross vision intact, PERRLA, conjunctivae and secretions clear  Ears- Hearing, canals-normal            Nose- Clear, no-Septal dev, mucus, polyps, erosion, perforation             Throat- Mallampati III , mucosa clear , drainage- none, tonsils- atrophic, + upper plate,  Neck- flexible , trachea midline, no stridor , thyroid nl, carotid no bruit Chest - symmetrical excursion , unlabored           Heart/CV- RRR , no murmur , no gallop  , no rub, nl s1 s2                           - JVD- none , edema- none, stasis changes- none, varices- none           Lung- clear to P&A, wheeze- none, cough- none , dullness-none, rub- none           Chest wall-  Abd-  Br/ Gen/ Rectal- Not done, not indicated Extrem- cyanosis- none, clubbing, none, atrophy- none, strength- nl Neuro- grossly intact to observation, but frail, alert      Assessment & Plan:

## 2019-03-17 NOTE — Patient Instructions (Signed)
Order- DME [facility]- d/c CPAP, no longer needed  Please call if we can help

## 2019-03-18 NOTE — Assessment & Plan Note (Signed)
Significant weight loss. Not able to be compliant with CPAP, but likely it wasn't helping much anymore as he lost weight.  Plan- d/c CPAP

## 2019-03-18 NOTE — Assessment & Plan Note (Signed)
I suspect some dementia.. Wife seems to be the caretaker and Freight forwarder of affairs.

## 2019-03-26 ENCOUNTER — Ambulatory Visit: Payer: Medicare Other | Admitting: Podiatry

## 2019-03-28 ENCOUNTER — Encounter: Payer: Self-pay | Admitting: Podiatry

## 2019-03-28 ENCOUNTER — Other Ambulatory Visit: Payer: Self-pay

## 2019-03-28 ENCOUNTER — Ambulatory Visit (INDEPENDENT_AMBULATORY_CARE_PROVIDER_SITE_OTHER): Payer: Medicare Other | Admitting: Podiatry

## 2019-03-28 DIAGNOSIS — M79675 Pain in left toe(s): Secondary | ICD-10-CM

## 2019-03-28 DIAGNOSIS — M79674 Pain in right toe(s): Secondary | ICD-10-CM | POA: Diagnosis not present

## 2019-03-28 DIAGNOSIS — B351 Tinea unguium: Secondary | ICD-10-CM

## 2019-03-28 DIAGNOSIS — D689 Coagulation defect, unspecified: Secondary | ICD-10-CM | POA: Diagnosis not present

## 2019-03-28 DIAGNOSIS — E119 Type 2 diabetes mellitus without complications: Secondary | ICD-10-CM | POA: Diagnosis not present

## 2019-03-28 NOTE — Progress Notes (Signed)
Patient ID: Alejandro Russell, male   DOB: [birth year], 85 y.o.   MRN: [id number] Complaint:  Visit Type: Patient returns to my office for continued preventative foot care services. Complaint: Patient states" my nails have grown long and thick and become painful to walk and wear shoes" Patient has been diagnosed with pre Diabetes.Marland Kitchen He presents for preventative foot care services. No changes to ROS  Podiatric Exam: Vascular: dorsalis pedis and posterior tibial pulses are palpable bilateral. Capillary return is immediate. Temperature gradient is WNL. Skin turgor WNL  Sensorium: Normal Semmes Weinstein monofilament test. Normal tactile sensation bilaterally. Nail Exam: Pt has thick disfigured discolored nails with subungual debris noted bilateral entire nail hallux through fifth toenails Ulcer Exam: There is no evidence of ulcer or pre-ulcerative changes or infection. Orthopedic Exam: Muscle tone and strength are WNL. No limitations in general ROM. No crepitus or effusions noted. Foot type and digits show no abnormalities. Bony prominences are unremarkable. Skin: No Porokeratosis. No infection or ulcers.  Asymptomatic callus  B/L.  Diagnosis:  Onychomycosis  , Pain in right toe, pain in left toes  Treatment & Plan Procedures and Treatment: Consent by patient was obtained for treatment procedures. The patient understood the discussion of treatment and procedures well. All questions were answered thoroughly reviewed. Debridement of mycotic and hypertrophic toenails, 1 through 5 bilateral and clearing of subungual debris. No ulceration, no infection noted. Iatrogenic lesion cauterized fourth toe left foot. Return Visit-Office Procedure: Patient instructed to return to the office for a follow up visit 3 months for continued evaluation and treatment.   Gardiner Barefoot DPM

## 2019-04-23 DIAGNOSIS — R05 Cough: Secondary | ICD-10-CM | POA: Diagnosis not present

## 2019-04-23 DIAGNOSIS — Z20818 Contact with and (suspected) exposure to other bacterial communicable diseases: Secondary | ICD-10-CM | POA: Diagnosis not present

## 2019-05-05 DIAGNOSIS — E46 Unspecified protein-calorie malnutrition: Secondary | ICD-10-CM | POA: Insufficient documentation

## 2019-06-27 ENCOUNTER — Encounter: Payer: Self-pay | Admitting: Podiatry

## 2019-06-27 ENCOUNTER — Other Ambulatory Visit: Payer: Self-pay

## 2019-06-27 ENCOUNTER — Ambulatory Visit (INDEPENDENT_AMBULATORY_CARE_PROVIDER_SITE_OTHER): Payer: Medicare Other | Admitting: Podiatry

## 2019-06-27 DIAGNOSIS — M79675 Pain in left toe(s): Secondary | ICD-10-CM

## 2019-06-27 DIAGNOSIS — B351 Tinea unguium: Secondary | ICD-10-CM

## 2019-06-27 DIAGNOSIS — D689 Coagulation defect, unspecified: Secondary | ICD-10-CM

## 2019-06-27 DIAGNOSIS — E119 Type 2 diabetes mellitus without complications: Secondary | ICD-10-CM

## 2019-06-27 DIAGNOSIS — M79674 Pain in right toe(s): Secondary | ICD-10-CM

## 2019-06-27 NOTE — Progress Notes (Signed)
Patient ID: Alejandro Russell, male   DOB: June 05, 1927, 86 y.o.   MRN: [id number] Complaint:  Visit Type: Patient returns to my office for continued preventative foot care services. Complaint: Patient states" my nails have grown long and thick and become painful to walk and wear shoes" Patient has been diagnosed with pre Diabetes.Marland Kitchen He presents for preventative foot care services. No changes to ROS  Podiatric Exam: Vascular: dorsalis pedis and posterior tibial pulses are palpable bilateral. Capillary return is immediate. Temperature gradient is WNL. Skin turgor WNL  Sensorium: Normal Semmes Weinstein monofilament test. Normal tactile sensation bilaterally. Nail Exam: Pt has thick disfigured discolored nails with subungual debris noted bilateral entire nail hallux through fifth toenails Ulcer Exam: There is no evidence of ulcer or pre-ulcerative changes or infection. Orthopedic Exam: Muscle tone and strength are WNL. No limitations in general ROM. No crepitus or effusions noted. Foot type and digits show no abnormalities. Bony prominences are unremarkable. Skin: No Porokeratosis. No infection or ulcers.  Asymptomatic callus  B/L. Necrotic tissue cuticle fourth toe left foot   Diagnosis:  Onychomycosis  , Pain in right toe, pain in left toes  Treatment & Plan Procedures and Treatment: Consent by patient was obtained for treatment procedures. The patient understood the discussion of treatment and procedures well. All questions were answered thoroughly reviewed. Debridement of mycotic and hypertrophic toenails, 1 through 5 bilateral and clearing of subungual debris. No ulceration, no infection noted.  Return Visit-Office Procedure: Patient instructed to return to the office for a follow up visit 3 months for continued evaluation and treatment.   Gardiner Barefoot DPM

## 2019-09-26 ENCOUNTER — Ambulatory Visit: Payer: Medicare Other | Admitting: Podiatry

## 2019-10-03 ENCOUNTER — Ambulatory Visit (INDEPENDENT_AMBULATORY_CARE_PROVIDER_SITE_OTHER): Payer: Medicare Other | Admitting: Podiatry

## 2019-10-03 ENCOUNTER — Other Ambulatory Visit: Payer: Self-pay

## 2019-10-03 ENCOUNTER — Encounter: Payer: Self-pay | Admitting: Podiatry

## 2019-10-03 VITALS — Temp 98.2°F

## 2019-10-03 DIAGNOSIS — S91209A Unspecified open wound of unspecified toe(s) with damage to nail, initial encounter: Secondary | ICD-10-CM | POA: Insufficient documentation

## 2019-10-03 DIAGNOSIS — D689 Coagulation defect, unspecified: Secondary | ICD-10-CM

## 2019-10-03 DIAGNOSIS — S91209S Unspecified open wound of unspecified toe(s) with damage to nail, sequela: Secondary | ICD-10-CM

## 2019-10-03 DIAGNOSIS — E119 Type 2 diabetes mellitus without complications: Secondary | ICD-10-CM

## 2019-10-03 DIAGNOSIS — B351 Tinea unguium: Secondary | ICD-10-CM

## 2019-10-03 DIAGNOSIS — M79675 Pain in left toe(s): Secondary | ICD-10-CM

## 2019-10-03 DIAGNOSIS — M79674 Pain in right toe(s): Secondary | ICD-10-CM

## 2019-10-03 NOTE — Patient Instructions (Signed)

## 2019-10-03 NOTE — Progress Notes (Signed)
This patient returns to my office for at risk foot care.  This patient requires this care by a professional since this patient will be at risk due to having peripheral neuropathy, history of DVT, type 2 diabetes and coagulation defect due to eliquis. This patient is unable to cut nails himself since the patient cannot reach his nails.These nails are painful walking and wearing shoes.  This patient presents for at risk foot care today.  General Appearance  Alert, conversant and in no acute stress.  Vascular  Dorsalis pedis and posterior tibial  pulses are palpable  bilaterally.  Capillary return is within normal limits  bilaterally. Temperature is within normal limits  bilaterally.  Neurologic  Senn-Weinstein monofilament wire test within normal limits  bilaterally. Muscle power within normal limits bilaterally.  Nails Thick disfigured discolored nails with subungual debris  from hallux to fifth toes bilaterally. No evidence of bacterial infection or drainage bilaterally.  Nail plate self avulsion fourth toe left foot with nead tissue at proximal nail fold.  Orthopedic  No limitations of motion  feet .  No crepitus or effusions noted.  No bony pathology or digital deformities noted.  Skin  normotropic skin with no porokeratosis noted bilaterally.  No signs of infections or ulcers noted.     Onychomycosis  Pain in right toes  Pain in left toes  Consent was obtained for treatment procedures.   Mechanical debridement of nails 1-5  bilaterally performed with a nail nipper.  Filed with dremel without incident. The nail and necrotic.Marland Kitchen tissue was removed from fourth toe nail. Neosporin/DSD.  Home instructions dispensed.  Talked with daughter to inform her of my findings.   Return office visit    3 months                  Told patient to return for periodic foot care and evaluation due to potential at risk complications.   Gardiner Barefoot DPM

## 2020-01-09 ENCOUNTER — Ambulatory Visit: Payer: Medicare Other | Admitting: Podiatry

## 2020-01-14 ENCOUNTER — Encounter: Payer: Self-pay | Admitting: Podiatry

## 2020-01-14 ENCOUNTER — Other Ambulatory Visit: Payer: Self-pay

## 2020-01-14 ENCOUNTER — Ambulatory Visit (INDEPENDENT_AMBULATORY_CARE_PROVIDER_SITE_OTHER): Payer: Medicare Other | Admitting: Podiatry

## 2020-01-14 DIAGNOSIS — E119 Type 2 diabetes mellitus without complications: Secondary | ICD-10-CM

## 2020-01-14 DIAGNOSIS — S91205D Unspecified open wound of left lesser toe(s) with damage to nail, subsequent encounter: Secondary | ICD-10-CM | POA: Diagnosis not present

## 2020-01-14 DIAGNOSIS — D689 Coagulation defect, unspecified: Secondary | ICD-10-CM | POA: Diagnosis not present

## 2020-01-14 DIAGNOSIS — B351 Tinea unguium: Secondary | ICD-10-CM

## 2020-01-14 DIAGNOSIS — M79675 Pain in left toe(s): Secondary | ICD-10-CM

## 2020-01-14 DIAGNOSIS — M79674 Pain in right toe(s): Secondary | ICD-10-CM

## 2020-01-14 DIAGNOSIS — S91209S Unspecified open wound of unspecified toe(s) with damage to nail, sequela: Secondary | ICD-10-CM

## 2020-01-14 NOTE — Progress Notes (Signed)
This patient returns to my office for at risk foot care.  This patient requires this care by a professional since this patient will be at risk due to having peripheral neuropathy, history of DVT, type 2 diabetes and coagulation defect due to eliquis.  This patient is unable to cut nails himself since the patient cannot reach his nails.These nails are painful walking and wearing shoes.  This patient presents for at risk foot care today.  He presents to the office with his wife.  General Appearance  Alert, conversant and in no acute stress.  Vascular  Dorsalis pedis and posterior tibial  pulses are palpable  bilaterally.  Capillary return is within normal limits  bilaterally. Temperature is within normal limits  bilaterally.  Neurologic  Senn-Weinstein monofilament wire test within normal limits  bilaterally. Muscle power within normal limits bilaterally.  Nails Thick disfigured discolored nails with subungual debris  from hallux to fifth toes bilaterally. No evidence of bacterial infection or drainage bilaterally.  Nail plate self avulsion fourth toe left foot with dead tissue at proximal nail fold.  Orthopedic  No limitations of motion  feet .  No crepitus or effusions noted.  No bony pathology or digital deformities noted.  Skin  normotropic skin with no porokeratosis noted bilaterally.  No signs of infections or ulcers noted.     Onychomycosis  Pain in right toes  Pain in left toes  Consent was obtained for treatment procedures.   Mechanical debridement of nails 1-5  bilaterally performed with a nail nipper.  Filed with dremel without incident. The nail and necrotic.Marland Kitchen tissue was removed from fourth toe nail. Neosporin/DSD.     Return office visit    3 months                  Told patient to return for periodic foot care and evaluation due to potential at risk complications.   Gardiner Barefoot DPM

## 2020-02-02 ENCOUNTER — Emergency Department (HOSPITAL_COMMUNITY): Payer: Medicare Other

## 2020-02-02 ENCOUNTER — Encounter (HOSPITAL_COMMUNITY): Payer: Self-pay | Admitting: Emergency Medicine

## 2020-02-02 ENCOUNTER — Inpatient Hospital Stay (HOSPITAL_COMMUNITY): Payer: Medicare Other

## 2020-02-02 ENCOUNTER — Other Ambulatory Visit: Payer: Self-pay

## 2020-02-02 ENCOUNTER — Observation Stay (HOSPITAL_COMMUNITY)
Admission: EM | Admit: 2020-02-02 | Discharge: 2020-02-03 | Disposition: A | Discharge status: Home / Self Care | Payer: Medicare Other | Attending: Internal Medicine | Admitting: Internal Medicine

## 2020-02-02 DIAGNOSIS — G92 Toxic encephalopathy: Secondary | ICD-10-CM

## 2020-02-02 DIAGNOSIS — N390 Urinary tract infection, site not specified: Secondary | ICD-10-CM | POA: Diagnosis present

## 2020-02-02 DIAGNOSIS — N179 Acute kidney failure, unspecified: Secondary | ICD-10-CM | POA: Diagnosis present

## 2020-02-02 DIAGNOSIS — F0390 Unspecified dementia without behavioral disturbance: Secondary | ICD-10-CM | POA: Insufficient documentation

## 2020-02-02 DIAGNOSIS — I2510 Atherosclerotic heart disease of native coronary artery without angina pectoris: Secondary | ICD-10-CM | POA: Insufficient documentation

## 2020-02-02 DIAGNOSIS — I10 Essential (primary) hypertension: Secondary | ICD-10-CM | POA: Diagnosis present

## 2020-02-02 DIAGNOSIS — R6520 Severe sepsis without septic shock: Secondary | ICD-10-CM

## 2020-02-02 DIAGNOSIS — F03918 Unspecified dementia, unspecified severity, with other behavioral disturbance: Secondary | ICD-10-CM | POA: Diagnosis present

## 2020-02-02 DIAGNOSIS — Z66 Do not resuscitate: Secondary | ICD-10-CM | POA: Diagnosis present

## 2020-02-02 DIAGNOSIS — K5900 Constipation, unspecified: Secondary | ICD-10-CM | POA: Diagnosis present

## 2020-02-02 DIAGNOSIS — R2990 Unspecified symptoms and signs involving the nervous system: Secondary | ICD-10-CM

## 2020-02-02 DIAGNOSIS — A419 Sepsis, unspecified organism: Secondary | ICD-10-CM

## 2020-02-02 DIAGNOSIS — Z86718 Personal history of other venous thrombosis and embolism: Secondary | ICD-10-CM

## 2020-02-02 DIAGNOSIS — Z20822 Contact with and (suspected) exposure to covid-19: Secondary | ICD-10-CM | POA: Insufficient documentation

## 2020-02-02 DIAGNOSIS — I25810 Atherosclerosis of coronary artery bypass graft(s) without angina pectoris: Secondary | ICD-10-CM | POA: Diagnosis present

## 2020-02-02 DIAGNOSIS — E119 Type 2 diabetes mellitus without complications: Secondary | ICD-10-CM | POA: Diagnosis not present

## 2020-02-02 DIAGNOSIS — G4733 Obstructive sleep apnea (adult) (pediatric): Secondary | ICD-10-CM | POA: Diagnosis present

## 2020-02-02 DIAGNOSIS — D72829 Elevated white blood cell count, unspecified: Secondary | ICD-10-CM | POA: Insufficient documentation

## 2020-02-02 DIAGNOSIS — D509 Iron deficiency anemia, unspecified: Secondary | ICD-10-CM | POA: Diagnosis present

## 2020-02-02 DIAGNOSIS — E871 Hypo-osmolality and hyponatremia: Secondary | ICD-10-CM | POA: Diagnosis present

## 2020-02-02 DIAGNOSIS — E860 Dehydration: Secondary | ICD-10-CM | POA: Diagnosis present

## 2020-02-02 DIAGNOSIS — R410 Disorientation, unspecified: Secondary | ICD-10-CM | POA: Diagnosis present

## 2020-02-02 DIAGNOSIS — J9601 Acute respiratory failure with hypoxia: Secondary | ICD-10-CM | POA: Diagnosis present

## 2020-02-02 DIAGNOSIS — R509 Fever, unspecified: Secondary | ICD-10-CM

## 2020-02-02 DIAGNOSIS — G9341 Metabolic encephalopathy: Secondary | ICD-10-CM

## 2020-02-02 DIAGNOSIS — D649 Anemia, unspecified: Secondary | ICD-10-CM | POA: Insufficient documentation

## 2020-02-02 LAB — CBC WITH DIFFERENTIAL/PLATELET
Abs Immature Granulocytes: 0.28 10*3/uL — ABNORMAL HIGH (ref 0.00–0.07)
Basophils Absolute: 0 10*3/uL (ref 0.0–0.1)
Basophils Relative: 0 %
Eosinophils Absolute: 0 10*3/uL (ref 0.0–0.5)
Eosinophils Relative: 0 %
HCT: 24.6 % — ABNORMAL LOW (ref 39.0–52.0)
Hemoglobin: 7.6 g/dL — ABNORMAL LOW (ref 13.0–17.0)
Immature Granulocytes: 2 %
Lymphocytes Relative: 11 %
Lymphs Abs: 1.4 10*3/uL (ref 0.7–4.0)
MCH: 28.4 pg (ref 26.0–34.0)
MCHC: 30.9 g/dL (ref 30.0–36.0)
MCV: 91.8 fL (ref 80.0–100.0)
Monocytes Absolute: 1.9 10*3/uL — ABNORMAL HIGH (ref 0.1–1.0)
Monocytes Relative: 14 %
Neutro Abs: 9.5 10*3/uL — ABNORMAL HIGH (ref 1.7–7.7)
Neutrophils Relative %: 73 %
Platelets: 151 10*3/uL (ref 150–400)
RBC: 2.68 MIL/uL — ABNORMAL LOW (ref 4.22–5.81)
RDW: 14.9 % (ref 11.5–15.5)
WBC: 13.1 10*3/uL — ABNORMAL HIGH (ref 4.0–10.5)
nRBC: 0 % (ref 0.0–0.2)

## 2020-02-02 LAB — CBG MONITORING, ED
Glucose-Capillary: 63 mg/dL — ABNORMAL LOW (ref 70–99)
Glucose-Capillary: 79 mg/dL (ref 70–99)
Glucose-Capillary: 86 mg/dL (ref 70–99)

## 2020-02-02 LAB — COMPREHENSIVE METABOLIC PANEL
ALT: 13 U/L (ref 0–44)
AST: 22 U/L (ref 15–41)
Albumin: 3.1 g/dL — ABNORMAL LOW (ref 3.5–5.0)
Alkaline Phosphatase: 53 U/L (ref 38–126)
Anion gap: 10 (ref 5–15)
BUN: 37 mg/dL — ABNORMAL HIGH (ref 8–23)
CO2: 22 mmol/L (ref 22–32)
Calcium: 9.4 mg/dL (ref 8.9–10.3)
Chloride: 99 mmol/L (ref 98–111)
Creatinine, Ser: 1.68 mg/dL — ABNORMAL HIGH (ref 0.61–1.24)
GFR calc Af Amer: 41 mL/min — ABNORMAL LOW (ref >60)
GFR calc non Af Amer: 35 mL/min — ABNORMAL LOW (ref >60)
Glucose, Bld: 85 mg/dL (ref 70–99)
Potassium: 4.5 mmol/L (ref 3.5–5.1)
Sodium: 131 mmol/L — ABNORMAL LOW (ref 135–145)
Total Bilirubin: 0.7 mg/dL (ref 0.3–1.2)
Total Protein: 5.7 g/dL — ABNORMAL LOW (ref 6.5–8.1)

## 2020-02-02 LAB — I-STAT VENOUS BLOOD GAS, ED
Acid-base deficit: 2 mmol/L (ref 0.0–2.0)
Bicarbonate: 22.8 mmol/L (ref 20.0–28.0)
Calcium, Ion: 1.31 mmol/L (ref 1.15–1.40)
HCT: 24 % — ABNORMAL LOW (ref 39.0–52.0)
Hemoglobin: 8.2 g/dL — ABNORMAL LOW (ref 13.0–17.0)
O2 Saturation: 89 %
Potassium: 4.3 mmol/L (ref 3.5–5.1)
Sodium: 135 mmol/L (ref 135–145)
TCO2: 24 mmol/L (ref 22–32)
pCO2, Ven: 39.5 mmHg — ABNORMAL LOW (ref 44.0–60.0)
pH, Ven: 7.369 (ref 7.250–7.430)
pO2, Ven: 58 mmHg — ABNORMAL HIGH (ref 32.0–45.0)

## 2020-02-02 LAB — I-STAT CHEM 8, ED
BUN: 35 mg/dL — ABNORMAL HIGH (ref 8–23)
Calcium, Ion: 1.28 mmol/L (ref 1.15–1.40)
Chloride: 98 mmol/L (ref 98–111)
Creatinine, Ser: 1.6 mg/dL — ABNORMAL HIGH (ref 0.61–1.24)
Glucose, Bld: 81 mg/dL (ref 70–99)
HCT: 25 % — ABNORMAL LOW (ref 39.0–52.0)
Hemoglobin: 8.5 g/dL — ABNORMAL LOW (ref 13.0–17.0)
Potassium: 4.5 mmol/L (ref 3.5–5.1)
Sodium: 132 mmol/L — ABNORMAL LOW (ref 135–145)
TCO2: 23 mmol/L (ref 22–32)

## 2020-02-02 LAB — URINALYSIS, ROUTINE W REFLEX MICROSCOPIC
Bacteria, UA: NONE SEEN
Bilirubin Urine: NEGATIVE
Glucose, UA: NEGATIVE mg/dL
Hgb urine dipstick: NEGATIVE
Ketones, ur: 5 mg/dL — AB
Nitrite: NEGATIVE
Protein, ur: 30 mg/dL — AB
Specific Gravity, Urine: 1.017 (ref 1.005–1.030)
WBC, UA: >50 WBC/hpf — ABNORMAL HIGH (ref 0–5)
pH: 5 (ref 5.0–8.0)

## 2020-02-02 LAB — DIFFERENTIAL
Abs Immature Granulocytes: 0.19 10*3/uL — ABNORMAL HIGH (ref 0.00–0.07)
Basophils Absolute: 0 10*3/uL (ref 0.0–0.1)
Basophils Relative: 0 %
Eosinophils Absolute: 0 10*3/uL (ref 0.0–0.5)
Eosinophils Relative: 0 %
Immature Granulocytes: 2 %
Lymphocytes Relative: 9 %
Lymphs Abs: 1.1 10*3/uL (ref 0.7–4.0)
Monocytes Absolute: 1.5 10*3/uL — ABNORMAL HIGH (ref 0.1–1.0)
Monocytes Relative: 12 %
Neutro Abs: 9.5 10*3/uL — ABNORMAL HIGH (ref 1.7–7.7)
Neutrophils Relative %: 77 %

## 2020-02-02 LAB — VITAMIN B12: Vitamin B-12: >7500 pg/mL — ABNORMAL HIGH (ref 180–914)

## 2020-02-02 LAB — IRON AND TIBC
Iron: 5 ug/dL — ABNORMAL LOW (ref 45–182)
Saturation Ratios: 2 % — ABNORMAL LOW (ref 17.9–39.5)
TIBC: 241 ug/dL — ABNORMAL LOW (ref 250–450)
UIBC: 236 ug/dL

## 2020-02-02 LAB — ABO/RH: ABO/RH(D): O NEG

## 2020-02-02 LAB — CBC
HCT: 27.3 % — ABNORMAL LOW (ref 39.0–52.0)
HCT: 29.1 % — ABNORMAL LOW (ref 39.0–52.0)
Hemoglobin: 8.2 g/dL — ABNORMAL LOW (ref 13.0–17.0)
Hemoglobin: 8.8 g/dL — ABNORMAL LOW (ref 13.0–17.0)
MCH: 28.2 pg (ref 26.0–34.0)
MCH: 28.3 pg (ref 26.0–34.0)
MCHC: 30 g/dL (ref 30.0–36.0)
MCHC: 30.2 g/dL (ref 30.0–36.0)
MCV: 93.8 fL (ref 80.0–100.0)
MCV: 93.6 fL (ref 80.0–100.0)
Platelets: 164 10*3/uL (ref 150–400)
Platelets: 157 10*3/uL (ref 150–400)
RBC: 2.91 MIL/uL — ABNORMAL LOW (ref 4.22–5.81)
RBC: 3.11 MIL/uL — ABNORMAL LOW (ref 4.22–5.81)
RDW: 14.7 % (ref 11.5–15.5)
RDW: 14.9 % (ref 11.5–15.5)
WBC: 12.3 10*3/uL — ABNORMAL HIGH (ref 4.0–10.5)
WBC: 14.2 10*3/uL — ABNORMAL HIGH (ref 4.0–10.5)
nRBC: 0 % (ref 0.0–0.2)
nRBC: 0 % (ref 0.0–0.2)

## 2020-02-02 LAB — AMMONIA: Ammonia: 18 umol/L (ref 9–35)

## 2020-02-02 LAB — MAGNESIUM: Magnesium: 1.7 mg/dL (ref 1.7–2.4)

## 2020-02-02 LAB — PROTIME-INR
INR: 1.3 — ABNORMAL HIGH (ref 0.8–1.2)
Prothrombin Time: 16.1 seconds — ABNORMAL HIGH (ref 11.4–15.2)

## 2020-02-02 LAB — CREATININE, URINE, RANDOM: Creatinine, Urine: 64.16 mg/dL

## 2020-02-02 LAB — RESP PANEL BY RT PCR (RSV, FLU A&B, COVID)
Influenza A by PCR: NEGATIVE
Influenza B by PCR: NEGATIVE
Respiratory Syncytial Virus by PCR: NEGATIVE
SARS Coronavirus 2 by RT PCR: NEGATIVE

## 2020-02-02 LAB — RETICULOCYTES
Immature Retic Fract: 8 % (ref 2.3–15.9)
RBC.: 2.66 MIL/uL — ABNORMAL LOW (ref 4.22–5.81)
Retic Count, Absolute: 32.5 10*3/uL (ref 19.0–186.0)
Retic Ct Pct: 1.2 % (ref 0.4–3.1)

## 2020-02-02 LAB — OSMOLALITY, URINE: Osmolality, Ur: 314 mOsm/kg (ref 300–900)

## 2020-02-02 LAB — SODIUM, URINE, RANDOM: Sodium, Ur: <10 mmol/L

## 2020-02-02 LAB — FERRITIN: Ferritin: 41 ng/mL (ref 24–336)

## 2020-02-02 LAB — GLUCOSE, CAPILLARY: Glucose-Capillary: 80 mg/dL (ref 70–99)

## 2020-02-02 LAB — APTT: aPTT: 31 seconds (ref 24–36)

## 2020-02-02 LAB — PREPARE RBC (CROSSMATCH)

## 2020-02-02 LAB — HEMOGLOBIN A1C
Hgb A1c MFr Bld: 5.4 % (ref 4.8–5.6)
Mean Plasma Glucose: 108.28 mg/dL

## 2020-02-02 LAB — LACTIC ACID, PLASMA: Lactic Acid, Venous: 1.3 mmol/L (ref 0.5–1.9)

## 2020-02-02 LAB — POC OCCULT BLOOD, ED: Fecal Occult Bld: NEGATIVE

## 2020-02-02 LAB — PROCALCITONIN: Procalcitonin: 1.98 ng/mL

## 2020-02-02 LAB — FOLATE: Folate: 29.3 ng/mL (ref >5.9)

## 2020-02-02 LAB — CK: Total CK: 153 U/L (ref 49–397)

## 2020-02-02 MED ORDER — POLYETHYLENE GLYCOL 3350 17 G PO PACK
17.0000 g | PACK | Freq: Two times a day (BID) | ORAL | Status: DC
  Administered 2020-02-02: 17 g via ORAL
  Filled 2020-02-02: qty 1

## 2020-02-02 MED ORDER — VANCOMYCIN HCL 750 MG/150ML IV SOLN
750.0000 mg | INTRAVENOUS | Status: DC
  Filled 2020-02-02: qty 150

## 2020-02-02 MED ORDER — METRONIDAZOLE IN NACL 5-0.79 MG/ML-% IV SOLN
500.0000 mg | Freq: Three times a day (TID) | INTRAVENOUS | Status: DC
  Administered 2020-02-02 – 2020-02-03 (×3): 500 mg via INTRAVENOUS
  Filled 2020-02-02 (×3): qty 100

## 2020-02-02 MED ORDER — ACETAMINOPHEN 325 MG PO TABS: 650.0000 mg | ORAL_TABLET | Freq: Four times a day (QID) | ORAL | Status: DC | PRN

## 2020-02-02 MED ORDER — ONDANSETRON HCL 4 MG/2ML IJ SOLN: 4.0000 mg | Freq: Four times a day (QID) | INTRAMUSCULAR | Status: DC | PRN

## 2020-02-02 MED ORDER — APIXABAN 2.5 MG PO TABS
2.5000 mg | ORAL_TABLET | Freq: Two times a day (BID) | ORAL | Status: DC
  Administered 2020-02-02 – 2020-02-03 (×2): 2.5 mg via ORAL
  Filled 2020-02-02 (×3): qty 1

## 2020-02-02 MED ORDER — FENOFIBRATE 54 MG PO TABS
54.0000 mg | ORAL_TABLET | Freq: Every day | ORAL | Status: DC
  Administered 2020-02-02 – 2020-02-03 (×2): 54 mg via ORAL
  Filled 2020-02-02 (×2): qty 1

## 2020-02-02 MED ORDER — HYDROCODONE-ACETAMINOPHEN 5-325 MG PO TABS
1.0000 | ORAL_TABLET | ORAL | Status: DC | PRN
  Administered 2020-02-03: 1 via ORAL
  Filled 2020-02-02: qty 1

## 2020-02-02 MED ORDER — SODIUM CHLORIDE 0.9 % IV BOLUS
1000.0000 mL | Freq: Once | INTRAVENOUS | Status: AC
  Administered 2020-02-02: 1000 mL via INTRAVENOUS

## 2020-02-02 MED ORDER — SODIUM CHLORIDE 0.9 % IV SOLN: INTRAVENOUS | Status: AC

## 2020-02-02 MED ORDER — DOCUSATE SODIUM 100 MG PO CAPS
100.0000 mg | ORAL_CAPSULE | Freq: Two times a day (BID) | ORAL | Status: DC
  Administered 2020-02-02 – 2020-02-03 (×2): 100 mg via ORAL
  Filled 2020-02-02 (×2): qty 1

## 2020-02-02 MED ORDER — INSULIN ASPART 100 UNIT/ML ~~LOC~~ SOLN: 0.0000 [IU] | SUBCUTANEOUS | Status: DC

## 2020-02-02 MED ORDER — VANCOMYCIN HCL 1250 MG/250ML IV SOLN
1250.0000 mg | Freq: Once | INTRAVENOUS | Status: AC
  Administered 2020-02-02: 1250 mg via INTRAVENOUS
  Filled 2020-02-02: qty 250

## 2020-02-02 MED ORDER — ACETAMINOPHEN 500 MG PO TABS
1000.0000 mg | ORAL_TABLET | Freq: Once | ORAL | Status: AC
  Administered 2020-02-02: 1000 mg via ORAL
  Filled 2020-02-02: qty 2

## 2020-02-02 MED ORDER — ACETAMINOPHEN 650 MG RE SUPP: 650.0000 mg | Freq: Four times a day (QID) | RECTAL | Status: DC | PRN

## 2020-02-02 MED ORDER — SODIUM CHLORIDE 0.9 % IV SOLN
2.0000 g | INTRAVENOUS | Status: DC
  Administered 2020-02-02: 2 g via INTRAVENOUS
  Filled 2020-02-02 (×2): qty 2

## 2020-02-02 MED ORDER — IOHEXOL 350 MG/ML SOLN
75.0000 mL | Freq: Once | INTRAVENOUS | Status: AC | PRN
  Administered 2020-02-02: 75 mL via INTRAVENOUS

## 2020-02-02 MED ORDER — SODIUM CHLORIDE 0.9% FLUSH
3.0000 mL | Freq: Two times a day (BID) | INTRAVENOUS | Status: DC
  Administered 2020-02-02 – 2020-02-03 (×2): 3 mL via INTRAVENOUS

## 2020-02-02 MED ORDER — SODIUM CHLORIDE 0.9% FLUSH
3.0000 mL | Freq: Once | INTRAVENOUS | Status: AC
  Administered 2020-02-02: 3 mL via INTRAVENOUS

## 2020-02-02 MED ORDER — ROSUVASTATIN CALCIUM 20 MG PO TABS
20.0000 mg | ORAL_TABLET | Freq: Every day | ORAL | Status: DC
  Administered 2020-02-02: 20 mg via ORAL
  Filled 2020-02-02: qty 1

## 2020-02-02 MED ORDER — ONDANSETRON HCL 4 MG PO TABS: 4.0000 mg | ORAL_TABLET | Freq: Four times a day (QID) | ORAL | Status: DC | PRN

## 2020-02-02 MED ORDER — MAGNESIUM SULFATE IN D5W 1-5 GM/100ML-% IV SOLN
1.0000 g | Freq: Once | INTRAVENOUS | Status: AC
  Administered 2020-02-02: 1 g via INTRAVENOUS
  Filled 2020-02-02: qty 100

## 2020-02-02 MED ORDER — SODIUM CHLORIDE 0.9% IV SOLUTION: Freq: Once | INTRAVENOUS | Status: AC

## 2020-02-02 MED ORDER — BISACODYL 10 MG RE SUPP
10.0000 mg | Freq: Every day | RECTAL | Status: DC | PRN
  Administered 2020-02-03: 10 mg via RECTAL
  Filled 2020-02-02: qty 1

## 2020-02-02 MED ORDER — ALBUTEROL SULFATE (2.5 MG/3ML) 0.083% IN NEBU: 2.5000 mg | INHALATION_SOLUTION | RESPIRATORY_TRACT | Status: DC | PRN

## 2020-02-02 MED ORDER — MILK AND MOLASSES ENEMA
1.0000 | Freq: Once | RECTAL | Status: DC
  Filled 2020-02-02: qty 240

## 2020-02-02 NOTE — H&P (Signed)
TEVAUGHN SANTIZO UJW:[id number] DOB: 1927-12-04 DOA: 02/02/2020     PCP: Martha Clan, MD   Outpatient Specialists:     Pulmonary   Dr. Maple Hudson   Patient arrived to ER on 02/02/20 at 1044 Referred by Attending Tegeler, Canary Brim, *    Patient coming from: home Lives With family    Chief Complaint:   Chief Complaint  Patient presents with  . Code Stroke    HPI: Alejandro Russell is a 86 y.o. male with medical history significant of coronary artery disease, DVT on Eliquis, sleep apnea, B12 deficiency, peripheral neuropathy, hypertension, hyperlipidemia, dementia  OSa not on CPAP, DM 2  Presented with  Aphasia and confusion last time seen normla was 1600. Dementia at baseline. Code stroke activated. Initially per EMS had some left sided neglect?  Per wife pt had a fever up to 39, pain with urination, generalized weakness and trouble ambulating. Wife noted more confusion and aggitation She called [facility] and he was given Rx for ABX. For possible UTi  Patient has significant dementia and needs assistance with ADL  But able to go to the the bathroom Code stroke was cancelled no TPA given outside the window  Chronic runny nose   He has been taking his Eliquis But some times his wife forgets few times a months No blood in stool, no black stools, constipated need suppository Have not a BM today but has not been eating well Wife recently gave probiotic  Patient did endorse urinary retention and difficulty emptying his bowels  Infectious risk factors:  Reports  Fever,  dry cough,   URI symptoms,     severe fatigue     Has  been vaccinated against COVID in February   Initial COVID TEST  NEGATIVE   Lab Results  Component Value Date   SARSCOV2NAA NEGATIVE 02/02/2020    Regarding pertinent Chronic problems:    Hyperlipidemia -  on statins Crestor   DVT on Eliquis   HTN on Imdur, lisinopril     OSA - not on CPAP since he lost weight, non not willing to put it on    Dementia -  on Aricept Nemenda  While in ER: CT non acute CXR unremarkable noted to be hypoxic MRI neg for CVA Noted to be anemic Hem occult neg Noted to be hypoxic down to mid 80 in ER on RA and had to start on on O2 COVID neg patient has been vacinated  ER Provider Called:   Neurology   Dr. Wilford Corner They Recommend admit to medicine  Will see   in ER  Hospitalist was called for admission for sepsis  The following Work up has been ordered so far:  Orders Placed This Encounter  Procedures  . Urine culture  . Blood culture (routine x 2)  . Resp Panel by RT PCR (RSV, Flu A&B, Covid) - Nasopharyngeal Swab  . CT HEAD CODE STROKE WO CONTRAST  . DG Chest Portable 1 View  . MR BRAIN WO CONTRAST  . CT Angio Chest PE W and/or Wo Contrast  . Protime-INR  . APTT  . CBC  . Differential  . Comprehensive metabolic panel  . Urinalysis, Routine w reflex microscopic  . Diet NPO time specified  . Cardiac monitoring  . Stroke swallow screen  . NIH Stroke Scale  . Modified Stroke Scale (mNIHSS) Document mNIHSS assessment every 2 hours for a total of 12 hours  . Saline Lock IV, Maintain IV access  . If  O2 Sat <94% administer O2 at 2 liters/minute via nasal cannula  . Height and weight  . vancomycin per pharmacy consult  . CeFEPIme (MAXIPIME) per pharmacy consult  . Consult to hospitalist  ALL PATIENTS BEING ADMITTED/HAVING PROCEDURES NEED COVID-19 SCREENING  . Pulse oximetry, continuous  . I-stat chem 8, ED  . CBG monitoring, ED  . POC occult blood, ED RN will collect  . POC CBG, ED  . CBG monitoring, ED  . ED EKG  . EKG 12-Lead    Following Medications were ordered in ER: Medications  ceFEPIme (MAXIPIME) 2 g in sodium chloride 0.9 % 100 mL IVPB (0 g Intravenous Stopped 02/02/20 1814)  vancomycin (VANCOREADY) IVPB 750 mg/150 mL (has no administration in time range)  sodium chloride flush (NS) 0.9 % injection 3 mL (3 mLs Intravenous Given 02/02/20 1154)  sodium chloride 0.9 % bolus 1,000 mL  (0 mLs Intravenous Stopped 02/02/20 1500)  acetaminophen (TYLENOL) tablet 1,000 mg (1,000 mg Oral Given 02/02/20 1454)  vancomycin (VANCOREADY) IVPB 1250 mg/250 mL (0 mg Intravenous Stopped 02/02/20 1652)        Consult Orders  (From admission, onward)         Start     Ordered   02/02/20 1819  Consult to hospitalist  ALL PATIENTS BEING ADMITTED/HAVING PROCEDURES NEED COVID-19 SCREENING  Once       Comments: ALL PATIENTS BEING ADMITTED/HAVING PROCEDURES NEED COVID-19 SCREENING  Provider:  (Not yet assigned)  Question Answer Comment  Place call to: [facility]   Reason for Consult Admit      02/02/20 1818          Significant initial  Findings: Abnormal Labs Reviewed  PROTIME-INR - Abnormal; Notable for the following components:      Result Value   Prothrombin Time 16.1 (*)    INR 1.3 (*)    All other components within normal limits  CBC - Abnormal; Notable for the following components:   WBC 12.3 (*)    RBC 2.91 (*)    Hemoglobin 8.2 (*)    HCT 27.3 (*)    All other components within normal limits  DIFFERENTIAL - Abnormal; Notable for the following components:   Neutro Abs 9.5 (*)    Monocytes Absolute 1.5 (*)    Abs Immature Granulocytes 0.19 (*)    All other components within normal limits  COMPREHENSIVE METABOLIC PANEL - Abnormal; Notable for the following components:   Sodium 131 (*)    BUN 37 (*)    Creatinine, Ser 1.68 (*)    Total Protein 5.7 (*)    Albumin 3.1 (*)    GFR calc non Af Amer 35 (*)    GFR calc Af Amer 41 (*)    All other components within normal limits  URINALYSIS, ROUTINE W REFLEX MICROSCOPIC - Abnormal; Notable for the following components:   Color, Urine AMBER (*)    APPearance HAZY (*)    Ketones, ur 5 (*)    Protein, ur 30 (*)    Leukocytes,Ua LARGE (*)    WBC, UA >50 (*)    All other components within normal limits  I-STAT CHEM 8, ED - Abnormal; Notable for the following components:   Sodium 132 (*)    BUN 35 (*)    Creatinine,  Ser 1.60 (*)    Hemoglobin 8.5 (*)    HCT 25.0 (*)    All other components within normal limits  CBG MONITORING, ED - Abnormal; Notable for the following  components:   Glucose-Capillary 63 (*)    All other components within normal limits     Otherwise labs showing:    Recent Labs  Lab 02/02/20 1030 02/02/20 1054  NA 131* 132*  K 4.5 4.5  CO2 22  --   GLUCOSE 85 81  BUN 37* 35*  CREATININE 1.68* 1.60*  CALCIUM 9.4  --     Cr   Up from baseline see below Lab Results  Component Value Date   CREATININE 1.60 (H) 02/02/2020   CREATININE 1.68 (H) 02/02/2020   CREATININE 1.03 05/24/2016    Recent Labs  Lab 02/02/20 1030  AST 22  ALT 13  ALKPHOS 53  BILITOT 0.7  PROT 5.7*  ALBUMIN 3.1*   Lab Results  Component Value Date   CALCIUM 9.4 02/02/2020   PHOS 2.7 05/24/2016   WBC      Component Value Date/Time   WBC 12.3 (H) 02/02/2020 1030   ANC    Component Value Date/Time   NEUTROABS 9.5 (H) 02/02/2020 1030   ALC No components found for: LYMPHAB    Plt: Lab Results  Component Value Date   PLT 164 02/02/2020   Lactic Acid, Venous    Component Value Date/Time   LATICACIDVEN 1.3 02/02/2020 1318    Procalcitonin 1.98   COVID-19 Labs  No results for input(s): DDIMER, FERRITIN, LDH, CRP in the last 72 hours.  Lab Results  Component Value Date   SARSCOV2NAA NEGATIVE 02/02/2020    Venous  Blood Gas result:  PH 7.369 pCO2 39.5 .  ABG    Component Value Date/Time   TCO2 23 02/02/2020 1054    HG/HCT    Down  from baseline see below    Component Value Date/Time   HGB 8.5 (L) 02/02/2020 1054   HCT 25.0 (L) 02/02/2020 1054      Cardiac Panel (last 3 results) Recent Labs    02/02/20 1930  CKTOTAL 153     ECG: Ordered Personally reviewed by me showing: HR : 89 Rhythm:  NSR,   no evidence of ischemic changes QTC 381        CBG (last 3)  Recent Labs    02/02/20 1118 02/02/20 1528 02/02/20 1542  GLUCAP 63* 79 86       UA  evidence  of UTI      Urine analysis:    Component Value Date/Time   COLORURINE AMBER (A) 02/02/2020 1530   APPEARANCEUR HAZY (A) 02/02/2020 1530   LABSPEC 1.017 02/02/2020 1530   PHURINE 5.0 02/02/2020 1530   GLUCOSEU NEGATIVE 02/02/2020 1530   HGBUR NEGATIVE 02/02/2020 1530   BILIRUBINUR NEGATIVE 02/02/2020 1530   KETONESUR 5 (A) 02/02/2020 1530   PROTEINUR 30 (A) 02/02/2020 1530   UROBILINOGEN 1.0 10/08/2009 2132   NITRITE NEGATIVE 02/02/2020 1530   LEUKOCYTESUR LARGE (A) 02/02/2020 1530      Ordered  CT HEAD remote left frontal and temporal occipital cortex infarcts.  CXR - NON acute  MRi - non acute  KUB  cONSTIPATION Bladder scan ordered    CTA chest - nonacute, no PE,  no evidence of infiltrate     ED Triage Vitals  Enc Vitals Group     BP 02/02/20 1049 (!) 100/51     Pulse Rate 02/02/20 1049 89     Resp 02/02/20 1049 20     Temp 02/02/20 1112 98.4 F (36.9 C)     Temp Source 02/02/20 1112 Oral     SpO2 02/02/20  1049 95 %     Weight 02/02/20 1400 150 lb (68 kg)     Height 02/02/20 1400 6' (1.829 m)     Head Circumference --      Peak Flow --      Pain Score --      Pain Loc --      Pain Edu? --      Excl. in GC? --   TMAX(24)@       Latest  Blood pressure 123/62, pulse 76, temperature (!) 102 F (38.9 C), temperature source Rectal, resp. rate 17, height 6' (1.829 m), weight 68.5 kg, SpO2 99 %.    Review of Systems:    Pertinent positives include:   Fevers, chills, fatigue, productive coug  dysuria Constitutional:  No weight loss, night sweats,  weight loss  HEENT:  No headaches, Difficulty swallowing,Tooth/dental problems,Sore throat,  No sneezing, itching, ear ache, nasal congestion, post nasal drip,  Cardio-vascular:  No chest pain, Orthopnea, PND, anasarca, dizziness, palpitations.no Bilateral lower extremity swelling  GI:  No heartburn, indigestion, abdominal pain, nausea, vomiting, diarrhea, change in bowel habits, loss of appetite, melena, blood  in stool, hematemesis Resp:  no shortness of breath at rest. No dyspnea on exertion, No excess mucus, noh, No non-productive cough, No coughing up of blood.No change in color of mucus.No wheezing. Skin:  no rash or lesions. No jaundice GU:  no, change in color of urine, no urgency or frequency. No straining to urinate.  No flank pain.  Musculoskeletal:  No joint pain or no joint swelling. No decreased range of motion. No back pain.  Psych:  No change in mood or affect. No depression or anxiety. No memory loss.  Neuro: no localizing neurological complaints, no tingling, no weakness, no double vision, no gait abnormality, no slurred speech, no confusion  All systems reviewed and apart from HOPI all are negative  Past Medical History:   Past Medical History:  Diagnosis Date  . Anemia   . Anxiety   . CAD (coronary artery disease)   . Chronic renal disease, stage III   . Depression   . Diabetes mellitus   . Diverticulosis   . Diverticulosis   . DVT (deep venous thrombosis) (HCC)    on coumadin  . Hyperlipidemia   . Hypertension   . Myocardial infarct (HCC)   . Nonspecific abnormal finding in stool contents 09/09/2013  . Obesity   . OSA (obstructive sleep apnea)    wears CPAP  . Peripheral neuropathy   . Psoriasis   . Tubulovillous adenoma of colon 1995  . Type 2 diabetes mellitus (HCC)   . Vitamin B12 deficiency      Past Surgical History:  Procedure Laterality Date  . CARDIAC CATHETERIZATION     many yrs ago with stent placed  . CHOLECYSTECTOMY  may 16,2011  . COLONOSCOPY WITH PROPOFOL N/A 09/09/2013   Procedure: COLONOSCOPY WITH PROPOFOL;  Surgeon: Meryl Dare, MD;  Location: [facility];  Service: Endoscopy;  Laterality: N/A;  . CORONARY STENT PLACEMENT    . ESOPHAGOGASTRODUODENOSCOPY (EGD) WITH PROPOFOL N/A 09/09/2013   Procedure: ESOPHAGOGASTRODUODENOSCOPY (EGD) WITH PROPOFOL;  Surgeon: Meryl Dare, MD;  Location: [facility];  Service: Endoscopy;   Laterality: N/A;  . INGUINAL HERNIA REPAIR      Social History:  Ambulatory  independently       reports that he quit smoking about 41 years ago. His smoking use included cigarettes. He has a 62.00 pack-year smoking history. He has  never used smokeless tobacco. He reports that he does not drink alcohol and does not use drugs.   Family History:   Family History  Problem Relation Age of Onset  . Emphysema Mother   . Dementia Father   . Colon cancer Neg Hx   . Esophageal cancer Neg Hx   . Stomach cancer Neg Hx     Allergies: No Known Allergies   Prior to Admission medications   Medication Sig Start Date End Date Taking? Authorizing Provider  alendronate (FOSAMAX) 70 MG tablet Take 70 mg by mouth once a week. 01/18/18   [provider]  B Complex-C (SUPER B COMPLEX PO) Take 1 capsule by mouth daily.    [provider]  buPROPion (WELLBUTRIN XL) 300 MG 24 hr tablet Take 300 mg by mouth every morning.    [provider]  Cholecalciferol (VITAMIN D3 PO) Take 1 tablet by mouth daily.    [provider]  clindamycin (CLEOCIN T) 1 % lotion  01/04/20   [provider]  [employer] EAR DROPS 6.5 % OTIC solution ADMINISTER 5 DROPS TO THE RIGHT EAR 2 (TWO) TIMES A DAY FOR 4 DAYS. 03/14/18   [provider]  cyanocobalamin (,VITAMIN B-12,) 1000 MCG/ML injection Inject 1,000 mcg into the muscle every 30 (thirty) days.    [provider]  DENTA 5000 PLUS 1.1 % CREA dental cream Place 1 application onto teeth at bedtime.  08/26/15   [provider]  donepezil (ARICEPT) 10 MG tablet 1 PILL DAILY AT BEDTIME, THEN INCREASE TO 10 MG DAILY 01/18/18   [provider]  ELIQUIS 2.5 MG TABS tablet Take 2.5 mg by mouth 2 (two) times daily. 01/02/20   [provider]  ergocalciferol (VITAMIN D2) 50000 units capsule Take 50,000 Units by mouth once a week.    [provider]  erythromycin ophthalmic ointment APPLY 2CM RIBBON  TO EYELID TWICE A DAY X 7 DAYS 11/23/17   [provider]  fenofibrate (TRICOR) 145 MG tablet Take 145 mg by mouth every evening.     [provider]  fluocinonide (LIDEX) 0.05 % external solution 1 ML APPLY ON THE SKIN AS DIRECTED MASSAGE A FEW DROPS ON SCALP 3X WEEKLY 06/07/19   [provider]  iron polysaccharides (NIFEREX) 150 MG capsule Take 150 mg by mouth daily.    [provider]  isosorbide mononitrate (IMDUR) 60 MG 24 hr tablet Take 60 mg by mouth every morning.     [provider]  levofloxacin (LEVAQUIN) 250 MG tablet TAKE 2 TABLETS TODAY, THEN 1 DAILY FOR 6 DOSES 04/23/19   [provider]  lisinopril (ZESTRIL) 10 MG tablet Take 10 mg by mouth daily. 09/22/19   [provider]  memantine (NAMENDA) 10 MG tablet Take 10 mg by mouth 2 (two) times daily. 09/23/19   [provider]  metFORMIN (GLUCOPHAGE) 500 MG tablet Take 500 mg by mouth 2 (two) times daily.      [provider]  metoprolol (LOPRESSOR) 100 MG tablet Take 50 mg by mouth 2 (two) times daily.      [provider]  Misc Natural Products (TURMERIC CURCUMIN) CAPS Take 2 capsules by mouth daily.    [provider]  niacin (NIASPAN) 1000 MG CR tablet Take 1,000 mg by mouth at bedtime.      [provider]  Niacin CR 1000 MG TBCR  02/25/19   [provider]  nitroGLYCERIN (NITROSTAT) 0.4 MG SL tablet Place  under the tongue. 01/11/20   [provider]  Omega-3 Fatty Acids (FISH OIL PO) Take 1 capsule by mouth daily.     [provider]  PARoxetine (PAXIL) 20 MG tablet Take 20 mg by mouth at bedtime.     [provider]  ramipril (ALTACE) 2.5 MG capsule Take 2.5 mg by mouth every evening.     [provider]  rosuvastatin (CRESTOR) 20 MG tablet Take 20 mg by mouth at bedtime. 12/15/19   [provider]  Vitamin D, Ergocalciferol, (DRISDOL) 50000 units CAPS capsule Take 50,000  Units by mouth every Sunday.    [provider]   Physical Exam: Vitals with BMI 02/02/2020 02/02/2020 02/02/2020  Height - - -  Weight - - -  BMI - - -  Systolic 123 99 -  Diastolic 62 52 -  Pulse 76 81 84    1. General:  in No Acute distress    Chronically ill -appearing 2. Psychological: Alert and  Oriented to self 3. Head/ENT:     Dry Mucous Membranes                          Head Non traumatic, neck supple                            Poor Dentition 4. SKIN:   decreased Skin turgor,  Skin clean Dry and intact no rash 5. Heart: Regular rate and rhythm no   Murmur, no Rub or gallop 6. Lungs:   no wheezes or crackles   7. Abdomen: Soft, non-tender,  distended  Obese bowel sounds present 8. Lower extremities: no clubbing, cyanosis, no edema 9. Neurologically Grossly intact, moving all 4 extremities equally  10. MSK: Normal range of motion   All other LABS:     Recent Labs  Lab 02/02/20 1030 02/02/20 1054  WBC 12.3*  --   NEUTROABS 9.5*  --   HGB 8.2* 8.5*  HCT 27.3* 25.0*  MCV 93.8  --   PLT 164  --      Recent Labs  Lab 02/02/20 1030 02/02/20 1054  NA 131* 132*  K 4.5 4.5  CL 99 98  CO2 22  --   GLUCOSE 85 81  BUN 37* 35*  CREATININE 1.68* 1.60*  CALCIUM 9.4  --      Recent Labs  Lab 02/02/20 1030  AST 22  ALT 13  ALKPHOS 53  BILITOT 0.7  PROT 5.7*  ALBUMIN 3.1*       Cultures: No results found for: SDES, SPECREQUEST, CULT, REPTSTATUS   Radiological Exams on Admission: MR BRAIN WO CONTRAST  Result Date: 02/02/2020 CLINICAL DATA:  Confusion and speech difficulty.  Possible stroke. EXAM: MRI HEAD WITHOUT CONTRAST TECHNIQUE: Multiplanar, multiecho pulse sequences of the brain and surrounding structures were obtained without intravenous contrast. COMPARISON:  Head CT 02/02/2020 and MRI 09/06/2016 FINDINGS: Brain: There is no evidence of an acute infarct, intracranial hemorrhage, mass, midline shift, or extra-axial fluid collection. A small  chronic cortical infarct laterally at the temporo-occipital junction is unchanged from the prior MRI, while a small chronic cortical infarct laterally in the posterior left frontal lobe is new. T2 hyperintensities in the cerebral white matter and pons are similar to the prior MRI and are nonspecific but compatible with mild chronic small vessel ischemic disease. There is moderately advanced cerebral atrophy with asymmetric left temporal lobe involvement.  Vascular: Major intracranial vascular flow voids are preserved. Skull and upper cervical spine: Unremarkable bone marrow signal. Sinuses/Orbits: Bilateral cataract extraction. Paranasal sinuses and mastoid air cells are clear. Other: None. IMPRESSION: 1. No acute intracranial abnormality. 2. Mild chronic small vessel ischemic disease with small chronic cortical infarcts as above. 3. Moderately advanced cerebral atrophy with asymmetric left temporal lobe involvement. Electronically Signed   By: Sebastian Ache M.D.   On: 02/02/2020 18:12   DG Chest Portable 1 View  Result Date: 02/02/2020 CLINICAL DATA:  Fever. EXAM: PORTABLE CHEST 1 VIEW COMPARISON:  November 23, 2016. FINDINGS: The heart size and mediastinal contours are within normal limits. Both lungs are clear. The visualized skeletal structures are unremarkable. IMPRESSION: No active disease. Electronically Signed   By: Lupita Raider M.D.   On: 02/02/2020 13:52   CT HEAD CODE STROKE WO CONTRAST  Result Date: 02/02/2020 CLINICAL DATA:  Code stroke.  Aphasia. EXAM: CT HEAD WITHOUT CONTRAST TECHNIQUE: Contiguous axial images were obtained from the base of the skull through the vertex without intravenous contrast. COMPARISON:  06/09/2016 FINDINGS: Brain: No evidence of acute infarction, hemorrhage, hydrocephalus, extra-axial collection or mass lesion/mass effect. Small chronic cortically based infarct at the left temporal occipital junction. Small remote appearing infarct at the lateral left frontal lobe. No  visible acute infarct, acute hemorrhage, hydrocephalus, or mass. Brain atrophy with asymmetric involvement of the left temporal lobe. Vascular: No hyperdense vessel. Atheromatous calcification at the mid basilar. Skull: Normal. Negative for fracture or focal lesion. Sinuses/Orbits: Bilateral cataract resection Other: These results were communicated to Dr. Wilford Corner at 11:04 amon 9/6/2021by text page via the [facility] messaging system. ASPECTS [facility] Stroke Program Early CT Score) - Ganglionic level infarction (caudate, lentiform nuclei, internal capsule, insula, M1-M3 cortex): 7 - Supraganglionic infarction (M4-M6 cortex): 3 Total score (0-10 with 10 being normal): 10-when accounting for chronic infarcts IMPRESSION: 1. No acute finding. 2. 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 3. Brain atrophy with advanced asymmetric involvement at the left temporal lobe. Electronically Signed   By: Marnee Spring M.D.   On: 02/02/2020 11:05    Chart has been reviewed    Assessment/Plan  86 y.o. male with medical history significant of coronary artery disease, DVT on Eliquis, sleep apnea, B12 deficiency, peripheral neuropathy, hypertension, hyperlipidemia, dementia  OSa not on CPAP, DM 2  Admitted for Sepsis, UTI  Present on Admission: . Sepsis (HCC) -   -SIRS criteria met with  elevated white blood cell count,       Component Value Date/Time   WBC 12.3 (H) 02/02/2020 1030   LYMPHSABS 1.1 02/02/2020 1030   fever  RR >20 Today's Vitals   02/02/20 1630 02/02/20 1645 02/02/20 1700 02/02/20 1830  BP: (!) 101/56  (!) 99/52 123/62  Pulse: 83 84 81 76  Resp: (!) 21 (!) 26 20 17   Temp:    99.5 F (37.5 C)  TempSrc:    Rectal  SpO2: 94% 93% (!) 88% 99%  Weight:      Height:          -Most likely source being: urinary,   Patient meeting criteria for Severe sepsis with    evidence of end organ damage/organ dysfunction such as   acute metabolic encephalopathy    Acute hypoxia  requiring new supplemental oxygen, SpO2: 99 %     - Obtain serial lactic acid and procalcitonin level.  - Initiated IV antibiotics   - await results of blood and urine culture  -  Rehydrated aggressively      Continue to rehydrate now gently as BP has responded 7:31 PM  . CAD (coronary artery disease) of artery bypass graft -  - chronic, continue  Statin hold beta blocker due to hypotention  . AKI (acute kidney injury) (HCC) - likely due to dehydration, obtain urine electrolytes  . Dehydration- rehydrate and follow  . Hyponatremia - in a setting of dehydration will rehydrate  Obtain urine electrolytes and continue to follow  . Constipation  - order KUB showing moderate to significant stool burden order bowel regimen  . Acute lower UTI -  treat with broad spectrum for  Now given other possible sources of infection, if clinically does not improve consider further imaging of her abdomen to rule out perinephric abscess.  At this point patient is not endorsing any back pain         await results of urine culture and adjust antibiotic coverage as needed  . Acute metabolic encephalopathy -   - most likely multifactorial secondary to combination of  infection   mild dehydration secondary to decreased by mouth intake, sundowning   in the setting of dimensia  - Will rehydrate   - treat underlining infection   - Hold contributing medications   - MRI of the brain unremarkable  - neurological exam appears to be nonfocal but patient unable to cooperate fully   - VBG unremarkable no evidence of hypercarbia    - no history of liver disease ammonia unremarkable Mental status improving with rehydration  . Essential hypertension - BP meds on hold given hypotension  . Iron deficiency anemia, unspecified - hemoccult neg, obtain anemia panel transfuse as need for hg below 7 obtain type and screen as patient may need transfusion during his hospitalization hemoglobin drifted down slightly with  rehydration Hemoccult negative  . Obstructive sleep apnea -not on CPAP anymore  . Acute respiratory failure with hypoxia (HCC) - CTA chest showing no PE or infiltrate  Reports urinary retention - order bladder scan most likely secondary to increased stool burden will obtain bowel regimen if renal function does not improve may need renal ultrasound  history of DVT continue anticoagulation with Eliquis  Diabetes mellitus diet controlled will order sliding scale Other plan as per orders.  DVT prophylaxis:  SCD   Code Status:    Code Status: Prior    DNR/DNI  as per  family want to donate body to [facility] I had personally discussed CODE STATUS with family     Family Communication:   Family  at  Bedside  plan of care was discussed on the phone with   Wife  Disposition Plan:   To home once workup is complete and patient is stable   Following barriers for discharge:                            Electrolytes corrected                               Anemia corrected/ stable                                                           Afebrile, white count improving able  to transition to PO antibiotics                             Will need to be able to tolerate PO                            Will likely need home health, home O2, set up                           Will need consultants to evaluate patient prior to discharge                    Would benefit from PT/OT eval prior to DC  Ordered                   Swallow eval - SLP ordered                   Diabetes care coordinator                   Transition of care consulted                                            Palliative care    consulted                   Consults called: none   Admission status:  ED Disposition    ED Disposition Condition Comment   Admit  Hospital Area: [facility] [[id number]]  Level of Care: Progressive [102]  Admit to Progressive based on following criteria: MULTISYSTEM THREATS such as stable  sepsis, metabolic/electrolyte imbalance with or without encephalopathy that is responding to early treatment.  May admit patient to [facility] or [facility] if equivalent level of care is available:: No  Covid Evaluation: Confirmed COVID Negative  Diagnosis: Sepsis [facility]) [[id number]]  Admitting Physician: Therisa Doyne [[id number]]  Attending Physician: Therisa Doyne [[id number]]  Estimated length of stay: 3 - 4 days  Certification:: I certify this patient will need inpatient services for at least 2 midnights          inpatient     I Expect 2 midnight stay secondary to severity of patient's current illness need for inpatient interventions justified by the following:  hemodynamic instability despite optimal treatment (tachycardia hypotension *hypoxia,  )   Severe lab/radiological/exam abnormalities including:    anemia, possible UTI  and extensive comorbidities including:  DM2   CAD  dementia  . Chronic anticoagulation  That are currently affecting medical management.   I expect  patient to be hospitalized for 2 midnights requiring inpatient medical care.  Patient is at high risk for adverse outcome (such as loss of life or disability) if not treated.  Indication for inpatient stay as follows:  Severe change from baseline regarding mental status Hemodynamic instability despite maximal medical therapy,    inability to maintain oral hydration    New or worsening hypoxia  Need for IV antibiotics, IV fluids,     Level of care   Progressive  tele indefinitely please discontinue once patient no longer qualifies COVID-19 Labs    Lab Results  Component Value Date   SARSCOV2NAA NEGATIVE 02/02/2020     Precautions: admitted  as Covid Negative      PPE: Used by the provider:   P100  eye Goggles,  Gloves      Veeda Virgo 02/02/2020, 9:40 PM    [facility]     after 2 AM please page floor coverage PA If 7AM-7PM, please contact the day team taking care of  the patient using [email or web address]   Patient was evaluated in the context of the global COVID-19 pandemic, which necessitated consideration that the patient might be at risk for infection with the SARS-CoV-2 virus that causes COVID-19. Institutional protocols and algorithms that pertain to the evaluation of patients at risk for COVID-19 are in a state of rapid change based on information released by regulatory bodies including the CDC and federal and state organizations. These policies and algorithms were followed during the patient's care.

## 2020-02-02 NOTE — ED Notes (Signed)
Attempted report x1. 

## 2020-02-02 NOTE — ED Provider Notes (Signed)
3:12 PM Care assumed from Dr. Roslynn Amble.  At time of transfer of care, patient awaiting results of urinalysis as well as MRI of the brain to look for stroke, versus TIA versus transient neurologic deficit related to infection.  Neurology note felt this may be encephalopathic.  Patient was already given broad-spectrum antibiotics for further management.  Labs showed initial concern for acute kidney injury and decreased hemoglobin compared to prior.  Fecal occult was negative.  Anticipate reassessment after work-up as well as admission.  Urinalysis returned and did not show completely convincing evidence of infection as there are no nitrites or bacteria however given the urinary symptoms preceding with his vital signs, patient may have an occult UTI.  Chest x-ray did not show pneumonia.  MRI of the brain did not show acute stroke.  Based on neurology recommendations and the patient's vital signs with fever, tachypnea, hypoxia, and soft pressures in the setting of infection or receiving broad-spectrum antibiotics, we will call for admission for transient encephalopathy likely caused by infection.  Based on neurology note, less concerned about TIA at this time.  Covid test is negative however given the ongoing pandemic, this may be a false negative.  We will call for admission for further management.   Sharline Lehane, Gwenyth Allegra, MD 02/02/20 2050

## 2020-02-02 NOTE — ED Provider Notes (Signed)
[place] EMERGENCY DEPARTMENT Provider Note   CSN: [id number] Arrival date & time: 02/02/20  1044  An emergency department physician performed an initial assessment on this suspected stroke patient at 1047.  History Chief Complaint  Patient presents with   Code Stroke    Alejandro Russell is a 86 y.o. male.  Level 5 caveat, history limited due to confusion, acuity.  History obtained from EMS report, wife.  She reports yesterday afternoon he started feeling ill, warm to touch.  Generally rundown, but denies any other neurologic symptoms.  Denies any complaints of chest pain, difficulty breathing, vomiting, abdominal pain.  Baseline cognitive impairment, oriented to person at baseline.  Wife states patient is at baseline.  HPI     Past Medical History:  Diagnosis Date   Anemia    Anxiety    CAD (coronary artery disease)    Chronic renal disease, stage III    Depression    Diabetes mellitus    Diverticulosis    Diverticulosis    DVT (deep venous thrombosis) (HCC)    on coumadin   Hyperlipidemia    Hypertension    Myocardial infarct ([place])    Nonspecific abnormal finding in stool contents 09/09/2013   Obesity    OSA (obstructive sleep apnea)    wears CPAP   Peripheral neuropathy    Psoriasis    Tubulovillous adenoma of colon 1995   Type 2 diabetes mellitus ([place])    Vitamin B12 deficiency     Patient Active Problem List   Diagnosis Date Noted   Traumatic avulsion of nail plate of toe 52/77/8242   Pain due to onychomycosis of toenails of both feet 11/13/2018   Peripheral neuropathy    SDH (subdural hematoma) ([place]) 05/23/2016   Fall    Head injury    History of DVT of lower extremity    Lip laceration    Supratherapeutic INR    Hyperglycemia    Iron deficiency anemia, unspecified 09/09/2013   Nonspecific abnormal finding in stool contents 09/09/2013   CRI (chronic renal insufficiency) 09/03/2013   Hx of  adenomatous colonic polyps 09/03/2013   CAD (coronary artery disease) of artery bypass graft 09/03/2013   Acute thromboembolism of deep veins of lower extremity ([place]) 11/01/2009   DIABETES MELLITUS, TYPE II 10/29/2009   HYPERLIPIDEMIA 10/29/2009   ANXIETY 10/29/2009   Obstructive sleep apnea 10/29/2009   HYPERTENSION 10/29/2009   DIVERTICULOSIS, COLON 10/29/2009    Past Surgical History:  Procedure Laterality Date   CARDIAC CATHETERIZATION     many yrs ago with stent placed   CHOLECYSTECTOMY  may 16,2011   COLONOSCOPY WITH PROPOFOL N/A 09/09/2013   Procedure: COLONOSCOPY WITH PROPOFOL;  Surgeon: Ladene Artist, MD;  Location: [facility];  Service: Endoscopy;  Laterality: N/A;   CORONARY STENT PLACEMENT     ESOPHAGOGASTRODUODENOSCOPY (EGD) WITH PROPOFOL N/A 09/09/2013   Procedure: ESOPHAGOGASTRODUODENOSCOPY (EGD) WITH PROPOFOL;  Surgeon: Ladene Artist, MD;  Location: [facility];  Service: Endoscopy;  Laterality: N/A;   INGUINAL HERNIA REPAIR         Family History  Problem Relation Age of Onset   Emphysema Mother    Dementia Father    Colon cancer Neg Hx    Esophageal cancer Neg Hx    Stomach cancer Neg Hx     Social History   Tobacco Use   Smoking status: Former Smoker    Packs/day: 2.00    Years: 31.00    Pack years: 62.00  Types: Cigarettes    Quit date: 05/29/1978    Years since quitting: 41.7   Smokeless tobacco: Never Used  Substance Use Topics   Alcohol use: No    Comment: Hx of alcohol abuse   Drug use: No    Home Medications Prior to Admission medications   Medication Sig Start Date End Date Taking? Authorizing Provider  alendronate (FOSAMAX) 70 MG tablet Take 70 mg by mouth once a week. 01/18/18   [provider]  B Complex-C (SUPER B COMPLEX PO) Take 1 capsule by mouth daily.    [provider]  buPROPion (WELLBUTRIN XL) 300 MG 24 hr tablet Take 300 mg by mouth every morning.    [provider]   Cholecalciferol (VITAMIN D3 PO) Take 1 tablet by mouth daily.    [provider]  clindamycin (CLEOCIN T) 1 % lotion  01/04/20   [provider]  [employer] EAR DROPS 6.5 % OTIC solution ADMINISTER 5 DROPS TO THE RIGHT EAR 2 (TWO) TIMES A DAY FOR 4 DAYS. 03/14/18   [provider]  cyanocobalamin (,VITAMIN B-12,) 1000 MCG/ML injection Inject 1,000 mcg into the muscle every 30 (thirty) days.    [provider]  DENTA 5000 PLUS 1.1 % CREA dental cream Place 1 application onto teeth at bedtime.  08/26/15   [provider]  donepezil (ARICEPT) 10 MG tablet 1 PILL DAILY AT BEDTIME, THEN INCREASE TO 10 MG DAILY 01/18/18   [provider]  ELIQUIS 2.5 MG TABS tablet Take 2.5 mg by mouth 2 (two) times daily. 01/02/20   [provider]  ergocalciferol (VITAMIN D2) 50000 units capsule Take 50,000 Units by mouth once a week.    [provider]  erythromycin ophthalmic ointment APPLY 2CM RIBBON TO EYELID TWICE A DAY X 7 DAYS 11/23/17   [provider]  fenofibrate (TRICOR) 145 MG tablet Take 145 mg by mouth every evening.     [provider]  fluocinonide (LIDEX) 0.05 % external solution 1 ML APPLY ON THE SKIN AS DIRECTED MASSAGE A FEW DROPS ON SCALP 3X WEEKLY 06/07/19   [provider]  iron polysaccharides (NIFEREX) 150 MG capsule Take 150 mg by mouth daily.    [provider]  isosorbide mononitrate (IMDUR) 60 MG 24 hr tablet Take 60 mg by mouth every morning.     [provider]  levofloxacin (LEVAQUIN) 250 MG tablet TAKE 2 TABLETS TODAY, THEN 1 DAILY FOR 6 DOSES 04/23/19   [provider]  lisinopril (ZESTRIL) 10 MG tablet Take 10 mg by mouth daily. 09/22/19   [provider]  memantine (NAMENDA) 10 MG tablet Take 10 mg by mouth 2 (two) times daily. 09/23/19   [provider]  metFORMIN (GLUCOPHAGE) 500 MG tablet Take 500 mg by mouth 2 (two) times daily.      [provider]  metoprolol (LOPRESSOR) 100 MG tablet Take 50 mg by mouth 2 (two) times daily.      [provider]  Misc Natural Products (TURMERIC CURCUMIN) CAPS Take 2 capsules by mouth daily.    [provider]  niacin (NIASPAN) 1000 MG CR tablet Take 1,000 mg by mouth at bedtime.      [provider]  Niacin CR 1000 MG TBCR  02/25/19   [provider]  nitroGLYCERIN (NITROSTAT) 0.4 MG SL tablet Place under the tongue. 01/11/20   [provider]  Omega-3 Fatty Acids (FISH OIL PO) Take 1 capsule by mouth daily.  [provider]  PARoxetine (PAXIL) 20 MG tablet Take 20 mg by mouth at bedtime.     [provider]  ramipril (ALTACE) 2.5 MG capsule Take 2.5 mg by mouth every evening.     [provider]  rosuvastatin (CRESTOR) 20 MG tablet Take 20 mg by mouth at bedtime. 12/15/19   [provider]  Vitamin D, Ergocalciferol, (DRISDOL) 50000 units CAPS capsule Take 50,000 Units by mouth every Sunday.    [provider]    Allergies    Patient has no known allergies.  Review of Systems   Review of Systems  Unable to perform ROS: Dementia    Physical Exam Updated Vital Signs BP (!) 129/59    Pulse 88    Temp (!) 102 F (38.9 C) (Rectal)    Resp (!) 23    Ht 6' (1.829 m)    Wt 68.5 kg    SpO2 97%    BMI 20.48 kg/m   Physical Exam Vitals and nursing note reviewed.  Constitutional:      Appearance: He is well-developed.  HENT:     Head: Normocephalic and atraumatic.  Eyes:     Conjunctiva/sclera: Conjunctivae normal.  Cardiovascular:     Rate and Rhythm: Normal rate and regular rhythm.     Heart sounds: No murmur heard.   Pulmonary:     Effort: Pulmonary effort is normal. No respiratory distress.     Breath sounds: Normal breath sounds.  Abdominal:     Palpations: Abdomen is soft.     Tenderness: There is no abdominal tenderness.  Genitourinary:    Comments: Stool brown Musculoskeletal:         General: No deformity or signs of injury.     Cervical back: Neck supple.  Skin:    General: Skin is warm and dry.  Neurological:     Mental Status: He is alert.     Comments: Alert, oriented to person but not time or place  5 out of 5 strength in upper and lower extremities  Sensation to light touch intact in bilateral upper and lower extremities  Psychiatric:        Mood and Affect: Mood normal.     ED Results / Procedures / Treatments   Labs (all labs ordered are listed, but only abnormal results are displayed) Labs Reviewed  PROTIME-INR - Abnormal; Notable for the following components:      Result Value   Prothrombin Time 16.1 (*)    INR 1.3 (*)    All other components within normal limits  CBC - Abnormal; Notable for the following components:   WBC 12.3 (*)    RBC 2.91 (*)    Hemoglobin 8.2 (*)    HCT 27.3 (*)    All other components within normal limits  DIFFERENTIAL - Abnormal; Notable for the following components:   Neutro Abs 9.5 (*)    Monocytes Absolute 1.5 (*)    Abs Immature Granulocytes 0.19 (*)    All other components within normal limits  COMPREHENSIVE METABOLIC PANEL - Abnormal; Notable for the following components:   Sodium 131 (*)    BUN 37 (*)    Creatinine, Ser 1.68 (*)    Total Protein 5.7 (*)    Albumin 3.1 (*)    GFR calc non Af Amer 35 (*)    GFR calc Af Amer 41 (*)    All other components within normal limits  I-STAT CHEM 8, ED - Abnormal; Notable for  the following components:   Sodium 132 (*)    BUN 35 (*)    Creatinine, Ser 1.60 (*)    Hemoglobin 8.5 (*)    HCT 25.0 (*)    All other components within normal limits  CBG MONITORING, ED - Abnormal; Notable for the following components:   Glucose-Capillary 63 (*)    All other components within normal limits  URINE CULTURE  CULTURE, BLOOD (ROUTINE X 2)  CULTURE, BLOOD (ROUTINE X 2)  RESP PANEL BY RT PCR (RSV, FLU A&B, COVID)  APTT  LACTIC ACID, PLASMA  URINALYSIS, ROUTINE W REFLEX  MICROSCOPIC  POC OCCULT BLOOD, ED  CBG MONITORING, ED  CBG MONITORING, ED    EKG EKG Interpretation  Date/Time:  Monday February 02 2020 11:17:36 EDT Ventricular Rate:  89 PR Interval:    QRS Duration: 130 QT Interval:  313 QTC Calculation: 381 R Axis:   -46 Text Interpretation: Sinus rhythm Prolonged PR interval Left bundle branch block Confirmed by Madalyn Rob [phone]) on 02/02/2020 11:21:06 AM   Radiology DG Chest Portable 1 View  Result Date: 02/02/2020 CLINICAL DATA:  Fever. EXAM: PORTABLE CHEST 1 VIEW COMPARISON:  November 23, 2016. FINDINGS: The heart size and mediastinal contours are within normal limits. Both lungs are clear. The visualized skeletal structures are unremarkable. IMPRESSION: No active disease. Electronically Signed   By: Marijo Conception M.D.   On: 02/02/2020 13:52   CT HEAD CODE STROKE WO CONTRAST  Result Date: 02/02/2020 CLINICAL DATA:  Code stroke.  Aphasia. EXAM: CT HEAD WITHOUT CONTRAST TECHNIQUE: Contiguous axial images were obtained from the base of the skull through the vertex without intravenous contrast. COMPARISON:  06/09/2016 FINDINGS: Brain: No evidence of acute infarction, hemorrhage, hydrocephalus, extra-axial collection or mass lesion/mass effect. Small chronic cortically based infarct at the left temporal occipital junction. Small remote appearing infarct at the lateral left frontal lobe. No visible acute infarct, acute hemorrhage, hydrocephalus, or mass. Brain atrophy with asymmetric involvement of the left temporal lobe. Vascular: No hyperdense vessel. Atheromatous calcification at the mid basilar. Skull: Normal. Negative for fracture or focal lesion. Sinuses/Orbits: Bilateral cataract resection Other: These results were communicated to Dr. Rory Percy at 11:04 amon 9/6/2021by text page via the [facility] messaging system. ASPECTS [facility] Stroke Program Early CT Score) - Ganglionic level infarction (caudate, lentiform nuclei, internal capsule, insula, M1-M3  cortex): 7 - Supraganglionic infarction (M4-M6 cortex): 3 Total score (0-10 with 10 being normal): 10-when accounting for chronic infarcts IMPRESSION: 1. No acute finding. 2. 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 3. Brain atrophy with advanced asymmetric involvement at the left temporal lobe. Electronically Signed   By: Monte Fantasia M.D.   On: 02/02/2020 11:05    Procedures Procedures (including critical care time)  Medications Ordered in ED Medications  ceFEPIme (MAXIPIME) 2 g in sodium chloride 0.9 % 100 mL IVPB (2 g Intravenous New Bag/Given 02/02/20 1534)  vancomycin (VANCOREADY) IVPB 1250 mg/250 mL (1,250 mg Intravenous New Bag/Given 02/02/20 1522)  vancomycin (VANCOREADY) IVPB 750 mg/150 mL (has no administration in time range)  sodium chloride flush (NS) 0.9 % injection 3 mL (3 mLs Intravenous Given 02/02/20 1154)  sodium chloride 0.9 % bolus 1,000 mL (1,000 mLs Intravenous New Bag/Given 02/02/20 1328)  acetaminophen (TYLENOL) tablet 1,000 mg (1,000 mg Oral Given 02/02/20 1454)    ED Course  I have reviewed the triage vital signs and the nursing notes.  Pertinent labs & imaging results that were available during my care of the  patient were reviewed by me and considered in my medical decision making (see chart for details).    MDM Rules/Calculators/A&P                          86 year old male presents to ER as a stroke alert with concern for acute onset aphasia.  Upon obtaining further history, concern for possible fever, patient at neuro baseline.  Regarding possibility of stroke, CT head was negative, neurology recommending MRI but does not need full stroke work-up with MRI negative.  Initial oral temp was normal however rectal temp was elevated.  Concern for possible sepsis, provided fluids, broad-spectrum antibiotics and proceed with infectious work-up.  Lactic was normal, patient is not tachycardic or hypotensive.  CBC noted for mild leukocytosis as well as  anemia though there is no recent baseline for comparison.  His Hemoccult is negative.  Patient's abdomen is soft.  Patient does not have neck pain or neck stiffness, lower suspicion for CNS process.  At time of signout, Covid test pending and UA pending.  Please see Dr. Cyndia Skeeters note for final plan and disposition.   Final Clinical Impression(s) / ED Diagnoses Final diagnoses:  Stroke-like symptoms  Fever, unspecified fever cause  AKI (acute kidney injury) ([place])  Leukocytosis, unspecified type  Anemia, unspecified type    Rx / DC Orders ED Discharge Orders    None       Lucrezia Starch, MD 02/02/20 [id number]

## 2020-02-02 NOTE — ED Triage Notes (Signed)
Pt. Arrived via EMS c/o aphasia. Code stroke activated. [place] 9/5 1600. From home. Reported altered at baseline.

## 2020-02-02 NOTE — Progress Notes (Signed)
Pharmacy Antibiotic Note  TORETTO TINGLER is a 86 y.o. male admitted on 02/02/2020 with sepsis.  Pharmacy has been consulted for vancomycin and cefepime dosing.  Plan: Vancomycin 1,250 mg IV x 1, followed by vancomycin 750 mg IV every 24 hours.   Goal trough 15-20 mcg/mL. Cefepime 2 g IV q24h Follow up cultures, renal function, and vancomycin levels as indicated  Height: 6' (182.9 cm) Weight: 68 kg (150 lb) IBW/kg (Calculated) : 77.6  Temp (24hrs), Avg:99.6 F (37.6 C), Min:98.4 F (36.9 C), Max:102 F (38.9 C)  Recent Labs  Lab 02/02/20 1030 02/02/20 1054 02/02/20 1318  WBC 12.3*  --   --   CREATININE 1.68* 1.60*  --   LATICACIDVEN  --   --  1.3    Estimated Creatinine Clearance: 29.5 mL/min (A) (by C-G formula based on SCr of 1.6 mg/dL (H)).    No Known Allergies  Antimicrobials this admission: Vancomycin 9/6 >>  Cefepime 9/6 >>   Dose adjustments this admission: None  Microbiology results: 9/6 BCx: sent 9/6 UCx: sent   Thank you for allowing pharmacy to be a part of this patient's care.  Kerby Nora, PharmD, BCPS Phone [phone] 02/02/2020       2:53 PM  Please check [email or web address] for unit-specific pharmacist phone numbers

## 2020-02-02 NOTE — Consult Note (Signed)
Neurology Consultation  Reason for Consult: Code stroke-confusion, speech problems Referring Physician: Dr. Madalyn Rob  CC: Confusion, speech problems  History is obtained from: Initially from EMS, but later on over the phone from wife  HPI: Alejandro Russell is a 86 y.o. male past medical history of coronary artery disease, DVT on Eliquis, sleep apnea, B12 deficiency, diabetes, peripheral neuropathy, hypertension, hyperlipidemia, presenting for emergent evaluation to the hospital via EMS as a code stroke for confusion and speech problems. The patient was brought in by EMS because of concerns for aphasia-they could not get him to say anything other than his name or follow commands and possibly noted some right-sided preference and left-sided neglect although that was not certain.  Given his advanced age and risk factors, code stroke was activated from the field. Upon arrival patient was examined in the ER, taken for stat CT scan.  During this time, wife was reached on the phone who provided further history: -According to the wife, he was not feeling well since yesterday, around noon yesterday he spiked a fever for which she gave him Tylenol.  She called the primary care office and the on-call physician who returned the call prescribed an antibiotic for a presumed UTI since he had also complained of some pain and burning while going to the bathroom.  She started giving him the antibiotics but later that night and early this morning, he started having difficulty walking towards the bathroom even with a use of a walker.  At baseline he does not use a walker.  He has dementia and requires nearly 24/7 care but is able to walk without support otherwise at baseline. According to the wife, he has not had many UTIs.  Was complaining of significant pain in micturition yesterday.  Also had extreme urinary urgency and frequency. Denies any sick contacts.  Denies chest pain shortness of breath.   LKW: At best  12 PM on 02/01/2020 tpa given?: no, outside the window Premorbid modified Rankin scale (mRS): 3-4 (walks without a walker for most part but requires help with ADLs including feeding, bathing consistently, and requires help with bills and finances-which has been done by the wife exclusively for past many years.)   ROS: Unable to obtain due to altered mental status  Past Medical History:  Diagnosis Date  . Anemia   . Anxiety   . CAD (coronary artery disease)   . Chronic renal disease, stage III   . Depression   . Diabetes mellitus   . Diverticulosis   . Diverticulosis   . DVT (deep venous thrombosis) (HCC)    on coumadin  . Hyperlipidemia   . Hypertension   . Myocardial infarct ([place])   . Nonspecific abnormal finding in stool contents 09/09/2013  . Obesity   . OSA (obstructive sleep apnea)    wears CPAP  . Peripheral neuropathy   . Psoriasis   . Tubulovillous adenoma of colon 1995  . Type 2 diabetes mellitus ([place])   . Vitamin B12 deficiency     Family History  Problem Relation Age of Onset  . Emphysema Mother   . Dementia Father   . Colon cancer Neg Hx   . Esophageal cancer Neg Hx   . Stomach cancer Neg Hx     Social History:   reports that he quit smoking about 41 years ago. His smoking use included cigarettes. He has a 62.00 pack-year smoking history. He has never used smokeless tobacco. He reports that he does not drink alcohol  and does not use drugs.  Medications  Current Facility-Administered Medications:  .  sodium chloride flush (NS) 0.9 % injection 3 mL, 3 mL, Intravenous, Once, Dykstra, Ellwood Dense, MD  Current Outpatient Medications:  .  alendronate (FOSAMAX) 70 MG tablet, Take 70 mg by mouth once a week., Disp: , Rfl: 3 .  B Complex-C (SUPER B COMPLEX PO), Take 1 capsule by mouth daily., Disp: , Rfl:  .  buPROPion (WELLBUTRIN XL) 300 MG 24 hr tablet, Take 300 mg by mouth every morning., Disp: , Rfl:  .  Cholecalciferol (VITAMIN D3 PO), Take 1 tablet by mouth  daily., Disp: , Rfl:  .  clindamycin (CLEOCIN T) 1 % lotion, , Disp: , Rfl:  .  [employer] EAR DROPS 6.5 % OTIC solution, ADMINISTER 5 DROPS TO THE RIGHT EAR 2 (TWO) TIMES A DAY FOR 4 DAYS., Disp: , Rfl: 0 .  cyanocobalamin (,VITAMIN B-12,) 1000 MCG/ML injection, Inject 1,000 mcg into the muscle every 30 (thirty) days., Disp: , Rfl:  .  DENTA 5000 PLUS 1.1 % CREA dental cream, Place 1 application onto teeth at bedtime. , Disp: , Rfl: 5 .  donepezil (ARICEPT) 10 MG tablet, 1 PILL DAILY AT BEDTIME, THEN INCREASE TO 10 MG DAILY, Disp: , Rfl: 2 .  ELIQUIS 2.5 MG TABS tablet, Take 2.5 mg by mouth 2 (two) times daily., Disp: , Rfl:  .  ergocalciferol (VITAMIN D2) 50000 units capsule, Take 50,000 Units by mouth once a week., Disp: , Rfl:  .  erythromycin ophthalmic ointment, APPLY 2CM RIBBON TO EYELID TWICE A DAY X 7 DAYS, Disp: , Rfl: 0 .  fenofibrate (TRICOR) 145 MG tablet, Take 145 mg by mouth every evening. , Disp: , Rfl:  .  fluocinonide (LIDEX) 0.05 % external solution, 1 ML APPLY ON THE SKIN AS DIRECTED MASSAGE A FEW DROPS ON SCALP 3X WEEKLY, Disp: , Rfl:  .  iron polysaccharides (NIFEREX) 150 MG capsule, Take 150 mg by mouth daily., Disp: , Rfl:  .  isosorbide mononitrate (IMDUR) 60 MG 24 hr tablet, Take 60 mg by mouth every morning. , Disp: , Rfl:  .  levofloxacin (LEVAQUIN) 250 MG tablet, TAKE 2 TABLETS TODAY, THEN 1 DAILY FOR 6 DOSES, Disp: , Rfl:  .  lisinopril (ZESTRIL) 10 MG tablet, Take 10 mg by mouth daily., Disp: , Rfl:  .  memantine (NAMENDA) 10 MG tablet, Take 10 mg by mouth 2 (two) times daily., Disp: , Rfl:  .  metFORMIN (GLUCOPHAGE) 500 MG tablet, Take 500 mg by mouth 2 (two) times daily.  , Disp: , Rfl:  .  metoprolol (LOPRESSOR) 100 MG tablet, Take 50 mg by mouth 2 (two) times daily.  , Disp: , Rfl:  .  Misc Natural Products (TURMERIC CURCUMIN) CAPS, Take 2 capsules by mouth daily., Disp: , Rfl:  .  niacin (NIASPAN) 1000 MG CR tablet, Take 1,000 mg by mouth at bedtime.  , Disp: , Rfl:   .  Niacin CR 1000 MG TBCR, , Disp: , Rfl:  .  nitroGLYCERIN (NITROSTAT) 0.4 MG SL tablet, Place under the tongue., Disp: , Rfl:  .  Omega-3 Fatty Acids (FISH OIL PO), Take 1 capsule by mouth daily. , Disp: , Rfl:  .  PARoxetine (PAXIL) 20 MG tablet, Take 20 mg by mouth at bedtime. , Disp: , Rfl:  .  ramipril (ALTACE) 2.5 MG capsule, Take 2.5 mg by mouth every evening. , Disp: , Rfl:  .  rosuvastatin (CRESTOR) 20 MG tablet, Take  20 mg by mouth at bedtime., Disp: , Rfl:  .  Vitamin D, Ergocalciferol, (DRISDOL) 50000 units CAPS capsule, Take 50,000 Units by mouth every Sunday., Disp: , Rfl:   Exam: Current vital signs: BP 106/80   Pulse 87   Temp 98.4 F (36.9 C) (Oral)   Resp (!) 25   SpO2 94%  Vital signs in last 24 hours: Temp:  [98.4 F (36.9 C)] 98.4 F (36.9 C) (09/06 1122) Pulse Rate:  [84-89] 87 (09/06 1145) Resp:  [20-25] 25 (09/06 1145) BP: (100-119)/(49-80) 106/80 (09/06 1145) SpO2:  [94 %-96 %] 94 % (09/06 1145) General: He is awake, alert, appears slightly anxious. HEENT: Normocephalic atraumatic dry oral mucous membranes and what appears to be dentures in his mouth. CVS: Regular rate rhythm Respiratory: Breathing well saturating normally on room air Abdomen nondistended nontender Neurological exam He is awake, alert At best is only able to say his name and that doing a very dysarthric tone. Does not follow commands Cranial nerves: Pupils equal round reactive to light, extraocular movements intact, visual fields appear full with no preference or deviation, face appears grossly symmetric, tongue midline. Motor exam: He is able to lift both upper extremities antigravity but has poor attention concentration and let them drift to bed before 10 seconds in each.  He is able to also lift both lower extremities against gravity but due to inattention lets them drift to the bed before 5 seconds. Sensory exam: Intact to touch Coordination: Difficult to assess as he does not  follow commands. NIH stroke scale 1a Level of Conscious.: 0 1b LOC Questions: 2 1c LOC Commands: 2 2 Best Gaze: 0 3 Visual: 0 4 Facial Palsy: 0 5a Motor Arm - left: 1 5b Motor Arm - Right: 1 6a Motor Leg - Left: 1 6b Motor Leg - Right: 1 7 Limb Ataxia: 0 8 Sensory: 0 9 Best Language: 2 10 Dysarthria: 2 11 Extinct. and Inatten.: 0 TOTAL: 12  Labs I have reviewed labs in epic and the results pertinent to this consultation are: Leukocytosis with a white count 12.3, anemia with hemoglobin of 8.5, sodium 132, BUN 35 creatinine 1.6   CBC    Component Value Date/Time   WBC 12.3 (H) 02/02/2020 1030   RBC 2.91 (L) 02/02/2020 1030   HGB 8.5 (L) 02/02/2020 1054   HCT 25.0 (L) 02/02/2020 1054   PLT 164 02/02/2020 1030   MCV 93.8 02/02/2020 1030   MCH 28.2 02/02/2020 1030   MCHC 30.0 02/02/2020 1030   RDW 14.7 02/02/2020 1030   LYMPHSABS 1.1 02/02/2020 1030   MONOABS 1.5 (H) 02/02/2020 1030   EOSABS 0.0 02/02/2020 1030   BASOSABS 0.0 02/02/2020 1030    CMP     Component Value Date/Time   NA 132 (L) 02/02/2020 1054   K 4.5 02/02/2020 1054   CL 98 02/02/2020 1054   CO2 27 05/24/2016 0446   GLUCOSE 81 02/02/2020 1054   BUN 35 (H) 02/02/2020 1054   CREATININE 1.60 (H) 02/02/2020 1054   CALCIUM 10.1 05/24/2016 0446   PROT 6.0 10/13/2009 0553   ALBUMIN 2.5 (L) 10/13/2009 0553   AST 30 10/13/2009 0553   ALT 28 10/13/2009 0553   ALKPHOS 77 10/13/2009 0553   BILITOT 1.0 10/13/2009 0553   GFRNONAA >60 05/24/2016 0446   GFRAA >60 05/24/2016 0446   Imaging I have reviewed the images obtained:  CT-scan of the brain-no acute changes  Assessment: 86 year old man with above past medical history including that of  being on Eliquis for DVT, presented to the emergency room for evaluation of confusion and difficulty with speech. Due to concern for aphasia, code stroke was activated although he was outside the window for IV TPA. His exam is consistent more with encephalopathy than  with focal neurological deficit which would point more towards a stroke. Has mild leukocytosis as well as appears dehydrated based on the BMP.  Also has hyponatremia-and anemia which is much worse than baseline-baseline hemoglobin around 12.5, today's hemoglobin is 8.2. There is a possibility that he could have watershed infarcts but him being on Eliquis precludes him from getting IV TPA as well as he was outside the window.  Has baseline poor functional status precludes him from an endovascular thrombectomy.  Impression: Altered mental status-likely toxic metabolic encephalopathy Symptomatic anemia Leukocytosis-evaluate for UTI  Recommendations: MRI brain when able to - stroke w/u only if MRI is positive for stroke. Check CXR and UA Needs w/u for acute anemia to r/o acute bleed (?GI) Discussed with Dr. Roslynn Amble.  We will follow MRI results.   -- Amie Portland, MD [facility] Pager: [phone] If 7pm to 7am, please call on call as listed on AMION.   CRITICAL CARE ATTESTATION Performed by: Amie Portland, MD Total critical care time: 55 minutes Critical care time was exclusive of separately billable procedures and treating other patients and/or supervising APPs/Residents/Students Critical care was necessary to treat or prevent imminent or life-threatening deterioration due to toxic metabolic encephalopathy, evaluation for emergent stroke like symptoms. This patient is critically ill and at significant risk for neurological worsening and/or death and care requires constant monitoring. Critical care was time spent personally by me on the following activities: development of treatment plan with patient and/or surrogate as well as nursing, discussions with consultants, evaluation of patient's response to treatment, examination of patient, obtaining history from patient or surrogate, ordering and performing treatments and interventions, ordering and review of laboratory studies,  ordering and review of radiographic studies, pulse oximetry, re-evaluation of patient's condition, participation in multidisciplinary rounds and medical decision making of high complexity in the care of this patient.

## 2020-02-03 ENCOUNTER — Inpatient Hospital Stay (HOSPITAL_COMMUNITY): Payer: Medicare Other

## 2020-02-03 DIAGNOSIS — Z66 Do not resuscitate: Secondary | ICD-10-CM

## 2020-02-03 DIAGNOSIS — F03918 Unspecified dementia, unspecified severity, with other behavioral disturbance: Secondary | ICD-10-CM | POA: Diagnosis present

## 2020-02-03 DIAGNOSIS — G9341 Metabolic encephalopathy: Secondary | ICD-10-CM | POA: Diagnosis not present

## 2020-02-03 DIAGNOSIS — Z515 Encounter for palliative care: Secondary | ICD-10-CM

## 2020-02-03 DIAGNOSIS — N179 Acute kidney failure, unspecified: Secondary | ICD-10-CM | POA: Diagnosis not present

## 2020-02-03 DIAGNOSIS — J9601 Acute respiratory failure with hypoxia: Secondary | ICD-10-CM

## 2020-02-03 DIAGNOSIS — N390 Urinary tract infection, site not specified: Secondary | ICD-10-CM

## 2020-02-03 DIAGNOSIS — E119 Type 2 diabetes mellitus without complications: Secondary | ICD-10-CM | POA: Diagnosis not present

## 2020-02-03 LAB — BPAM RBC
Blood Product Expiration Date: 202109262359
ISSUE DATE / TIME: 202109062317
Unit Type and Rh: 9500

## 2020-02-03 LAB — CBC WITH DIFFERENTIAL/PLATELET
Abs Immature Granulocytes: 0.05 10*3/uL (ref 0.00–0.07)
Basophils Absolute: 0 10*3/uL (ref 0.0–0.1)
Basophils Relative: 0 %
Eosinophils Absolute: 0 10*3/uL (ref 0.0–0.5)
Eosinophils Relative: 0 %
HCT: 29.1 % — ABNORMAL LOW (ref 39.0–52.0)
Hemoglobin: 9.2 g/dL — ABNORMAL LOW (ref 13.0–17.0)
Immature Granulocytes: 1 %
Lymphocytes Relative: 8 %
Lymphs Abs: 0.7 10*3/uL (ref 0.7–4.0)
MCH: 28.4 pg (ref 26.0–34.0)
MCHC: 31.6 g/dL (ref 30.0–36.0)
MCV: 89.8 fL (ref 80.0–100.0)
Monocytes Absolute: 0.7 10*3/uL (ref 0.1–1.0)
Monocytes Relative: 9 %
Neutro Abs: 6.4 10*3/uL (ref 1.7–7.7)
Neutrophils Relative %: 82 %
Platelets: 166 10*3/uL (ref 150–400)
RBC: 3.24 MIL/uL — ABNORMAL LOW (ref 4.22–5.81)
RDW: 14.8 % (ref 11.5–15.5)
WBC: 7.8 10*3/uL (ref 4.0–10.5)
nRBC: 0 % (ref 0.0–0.2)

## 2020-02-03 LAB — URINE CULTURE: Culture: <10000 — AB

## 2020-02-03 LAB — COMPREHENSIVE METABOLIC PANEL
ALT: 15 U/L (ref 0–44)
AST: 27 U/L (ref 15–41)
Albumin: 2.9 g/dL — ABNORMAL LOW (ref 3.5–5.0)
Alkaline Phosphatase: 64 U/L (ref 38–126)
Anion gap: 6 (ref 5–15)
BUN: 33 mg/dL — ABNORMAL HIGH (ref 8–23)
CO2: 22 mmol/L (ref 22–32)
Calcium: 9.5 mg/dL (ref 8.9–10.3)
Chloride: 108 mmol/L (ref 98–111)
Creatinine, Ser: 1.36 mg/dL — ABNORMAL HIGH (ref 0.61–1.24)
GFR calc Af Amer: 53 mL/min — ABNORMAL LOW (ref >60)
GFR calc non Af Amer: 45 mL/min — ABNORMAL LOW (ref >60)
Glucose, Bld: 83 mg/dL (ref 70–99)
Potassium: 4.1 mmol/L (ref 3.5–5.1)
Sodium: 136 mmol/L (ref 135–145)
Total Bilirubin: 0.7 mg/dL (ref 0.3–1.2)
Total Protein: 5.8 g/dL — ABNORMAL LOW (ref 6.5–8.1)

## 2020-02-03 LAB — PHOSPHORUS: Phosphorus: 2.8 mg/dL (ref 2.5–4.6)

## 2020-02-03 LAB — TYPE AND SCREEN
ABO/RH(D): O NEG
Antibody Screen: NEGATIVE
Unit division: 0

## 2020-02-03 LAB — GLUCOSE, CAPILLARY
Glucose-Capillary: 71 mg/dL (ref 70–99)
Glucose-Capillary: 71 mg/dL (ref 70–99)
Glucose-Capillary: 83 mg/dL (ref 70–99)
Glucose-Capillary: 98 mg/dL (ref 70–99)

## 2020-02-03 LAB — MRSA PCR SCREENING: MRSA by PCR: NEGATIVE

## 2020-02-03 LAB — MAGNESIUM: Magnesium: 2.2 mg/dL (ref 1.7–2.4)

## 2020-02-03 LAB — TSH: TSH: 2.144 u[IU]/mL (ref 0.350–4.500)

## 2020-02-03 MED ORDER — CIPROFLOXACIN HCL 500 MG PO TABS: 500.0000 mg | ORAL_TABLET | Freq: Two times a day (BID) | ORAL | 0 refills | Status: AC

## 2020-02-03 MED ORDER — POLYETHYLENE GLYCOL 3350 17 G PO PACK: 17.0000 g | PACK | Freq: Every day | ORAL | 0 refills | Status: AC

## 2020-02-03 MED ORDER — SODIUM CHLORIDE 0.9 % IV SOLN
1.0000 g | INTRAVENOUS | Status: DC
  Administered 2020-02-03: 1 g via INTRAVENOUS
  Filled 2020-02-03: qty 1

## 2020-02-03 MED ORDER — FERROUS SULFATE 325 (65 FE) MG PO TABS: 325.0000 mg | ORAL_TABLET | Freq: Every day | ORAL | Status: DC

## 2020-02-03 NOTE — Evaluation (Signed)
Clinical/Bedside Swallow Evaluation Patient Details  Name: Alejandro Russell MRN: [id number] Date of Birth: 03-May-1928  Today's Date: 02/03/2020 Time: SLP Start Time (ACUTE ONLY): 1010 SLP Stop Time (ACUTE ONLY): 1025 SLP Time Calculation (min) (ACUTE ONLY): 15 min  Past Medical History:  Past Medical History:  Diagnosis Date  . Anemia   . Anxiety   . CAD (coronary artery disease)   . Chronic renal disease, stage III   . Depression   . Diabetes mellitus   . Diverticulosis   . Diverticulosis   . DVT (deep venous thrombosis) (HCC)    on coumadin  . Hyperlipidemia   . Hypertension   . Myocardial infarct ([place])   . Nonspecific abnormal finding in stool contents 09/09/2013  . Obesity   . OSA (obstructive sleep apnea)    wears CPAP  . Peripheral neuropathy   . Psoriasis   . Tubulovillous adenoma of colon 1995  . Type 2 diabetes mellitus ([place])   . Vitamin B12 deficiency    Past Surgical History:  Past Surgical History:  Procedure Laterality Date  . CARDIAC CATHETERIZATION     many yrs ago with stent placed  . CHOLECYSTECTOMY  may 16,2011  . COLONOSCOPY WITH PROPOFOL N/A 09/09/2013   Procedure: COLONOSCOPY WITH PROPOFOL;  Surgeon: Ladene Artist, MD;  Location: [facility];  Service: Endoscopy;  Laterality: N/A;  . CORONARY STENT PLACEMENT    . ESOPHAGOGASTRODUODENOSCOPY (EGD) WITH PROPOFOL N/A 09/09/2013   Procedure: ESOPHAGOGASTRODUODENOSCOPY (EGD) WITH PROPOFOL;  Surgeon: Ladene Artist, MD;  Location: [facility];  Service: Endoscopy;  Laterality: N/A;  . INGUINAL HERNIA REPAIR     HPI:  Pt is a 86 y.o. male with medical history significant of coronary artery disease, DVT on Eliquis, sleep apnea, B12 deficiency, peripheral neuropathy, hypertension, hyperlipidemia, dementia. Presented to ED with aphasia and confusion. Sepsis present on admission, MD reports most likely source being urinary.    Assessment / Plan / Recommendation Clinical Impression  Pt was lethargic/drowsy  during BSE but cooperative. Wife at bedside reports that he eats pureed food at home due to missing dentition. He demonstrated an adequate swallow function with thin liquids and puree with no s/sx of aspiration. Pt demonstrated apperance of strong oropharyngeal swallow with thin liquids. He demonstrated a slightly prolonged oral transit with puree. He was able to feed himself but required min verbal cues given baseline dementia. Recommend dys 1 diet (puree) and thin liquid.  SLP Visit Diagnosis: Dysphagia, unspecified (R13.10)    Aspiration Risk  Mild aspiration risk    Diet Recommendation Dysphagia 1 (Puree);Thin liquid   Liquid Administration via: Cup Medication Administration: Whole meds with puree Supervision: Patient able to self feed;Intermittent supervision to cue for compensatory strategies Compensations: Slow rate;Small sips/bites Postural Changes: Seated upright at 90 degrees    Other  Recommendations Oral Care Recommendations: Oral care BID   Follow up Recommendations None      Frequency and Duration            Prognosis        Swallow Study   General Date of Onset: 02/02/20 HPI: Pt is a 86 y.o. male with medical history significant of coronary artery disease, DVT on Eliquis, sleep apnea, B12 deficiency, peripheral neuropathy, hypertension, hyperlipidemia, dementia. Presented to ED with aphasia and confusion. Sepsis present on admission, MD reports most likely source being urinary.  Type of Study: Bedside Swallow Evaluation Diet Prior to this Study: Regular;Thin liquids Temperature Spikes Noted: No Respiratory Status: Room air History  of Recent Intubation: No Behavior/Cognition: Cooperative;Lethargic/Drowsy Oral Cavity Assessment: Excessive secretions Oral Care Completed by SLP: No Oral Cavity - Dentition: Missing dentition Vision: Functional for self-feeding Self-Feeding Abilities: Needs assist Patient Positioning: Upright in chair Baseline Vocal Quality: Normal     Oral/Motor/Sensory Function Overall Oral Motor/Sensory Function: Within functional limits   Ice Chips     Thin Liquid Thin Liquid: Within functional limits Presentation: Cup    Nectar Thick     Honey Thick     Puree Puree: Impaired Presentation: Spoon;Self Fed Oral Phase Functional Implications: Prolonged oral transit   Solid            Greggory Keen 02/03/2020,10:54 AM
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
## 2020-02-03 NOTE — Progress Notes (Signed)
Neurology Progress Note   S:// Patient seen and examined. Had a large bowel movement this morning. Appears much more calm today than yesterday.   O:// Current vital signs: BP (!) 107/51 (BP Location: Left Arm)   Pulse 72   Temp 98.2 F (36.8 C) (Axillary)   Resp 18   Ht 6' (1.829 m)   Wt 68.5 kg   SpO2 99%   BMI 20.48 kg/m  Vital signs in last 24 hours: Temp:  [97.7 F (36.5 C)-102 F (38.9 C)] 98.2 F (36.8 C) (09/07 0428) Pulse Rate:  [70-94] 72 (09/07 0428) Resp:  [17-29] 18 (09/07 0428) BP: (94-133)/(49-91) 107/51 (09/07 0428) SpO2:  [88 %-100 %] 99 % (09/07 0428) Weight:  [68 kg-68.5 kg] 68.5 kg (09/06 1459) General: Comfortably sitting in bed, opens eyes to voice, in no distress HEENT: Normocephalic atraumatic with dry oral mucous membranes Lungs: Clear CVs: Regular rhythm Abdomen nondistended Neurological exam Awake, alert, oriented to self.  Told me his name is Alejandro Russell from [place].  Upon asking him when removed from the ER, he said in 69. Upon asking the state of birth, he was able to tell me the correct date. He was not able to tell me today's date.  He could not tell me the president is. He perseverates Follow simple commands but is unable to follow complex commands. Speech is moderately dysarthric but partly due to his mentation. No gross aphasia noted except for the perseveration noted above. Cranial nerves: Pupils equal round react light, extraocular movements intact, visual fields full, face symmetric, tongue and palate midline. Motor exam: He is antigravity strength in all 4 extremities without any vertical drift. Sensory exam: Intact light touch all over Coordination: No gross ataxia noted Gait testing was deferred  Medications  Current Facility-Administered Medications:  .  acetaminophen (TYLENOL) tablet 650 mg, 650 mg, Oral, Q6H PRN **OR** acetaminophen (TYLENOL) suppository 650 mg, 650 mg, Rectal, Q6H PRN, Doutova,  Anastassia, MD .  albuterol (PROVENTIL) (2.5 MG/3ML) 0.083% nebulizer solution 2.5 mg, 2.5 mg, Nebulization, Q2H PRN, Doutova, Anastassia, MD .  apixaban (ELIQUIS) tablet 2.5 mg, 2.5 mg, Oral, BID, Doutova, Anastassia, MD, 2.5 mg at 02/03/20 0821 .  bisacodyl (DULCOLAX) suppository 10 mg, 10 mg, Rectal, Daily PRN, Roel Cluck, Anastassia, MD, 10 mg at 02/03/20 0615 .  ceFEPIme (MAXIPIME) 2 g in sodium chloride 0.9 % 100 mL IVPB, 2 g, Intravenous, Q24H, Doutova, Anastassia, MD, Stopped at 02/02/20 1736 .  docusate sodium (COLACE) capsule 100 mg, 100 mg, Oral, BID, Doutova, Anastassia, MD, 100 mg at 02/03/20 0821 .  fenofibrate tablet 54 mg, 54 mg, Oral, Daily, Doutova, Anastassia, MD, 54 mg at 02/03/20 0821 .  HYDROcodone-acetaminophen (NORCO/VICODIN) 5-325 MG per tablet 1-2 tablet, 1-2 tablet, Oral, Q4H PRN, Toy Baker, MD, 1 tablet at 02/03/20 0149 .  insulin aspart (novoLOG) injection 0-6 Units, 0-6 Units, Subcutaneous, Q4H, Doutova, Anastassia, MD .  metroNIDAZOLE (FLAGYL) IVPB 500 mg, 500 mg, Intravenous, Q8H, Doutova, Anastassia, MD, Stopped at 02/03/20 0515 .  milk and molasses enema, 1 enema, Rectal, Once, Doutova, Anastassia, MD .  ondansetron (ZOFRAN) tablet 4 mg, 4 mg, Oral, Q6H PRN **OR** ondansetron (ZOFRAN) injection 4 mg, 4 mg, Intravenous, Q6H PRN, Doutova, Anastassia, MD .  polyethylene glycol (MIRALAX / GLYCOLAX) packet 17 g, 17 g, Oral, BID, Doutova, Anastassia, MD, 17 g at 02/02/20 2230 .  rosuvastatin (CRESTOR) tablet 20 mg, 20 mg, Oral, QHS, Doutova, Anastassia, MD, 20 mg at 02/02/20 2238 .  sodium chloride flush (  NS) 0.9 % injection 3 mL, 3 mL, Intravenous, Q12H, Doutova, Anastassia, MD, 3 mL at 02/03/20 0821 .  vancomycin (VANCOREADY) IVPB 750 mg/150 mL, 750 mg, Intravenous, Q24H, Doutova, Anastassia, MD Labs CBC    Component Value Date/Time   WBC 7.8 02/03/2020 0504   RBC 3.24 (L) 02/03/2020 0504   HGB 9.2 (L) 02/03/2020 0504   HCT 29.1 (L) 02/03/2020 0504   PLT  166 02/03/2020 0504   MCV 89.8 02/03/2020 0504   MCH 28.4 02/03/2020 0504   MCHC 31.6 02/03/2020 0504   RDW 14.8 02/03/2020 0504   LYMPHSABS 0.7 02/03/2020 0504   MONOABS 0.7 02/03/2020 0504   EOSABS 0.0 02/03/2020 0504   BASOSABS 0.0 02/03/2020 0504    CMP     Component Value Date/Time   NA 136 02/03/2020 0504   K 4.1 02/03/2020 0504   CL 108 02/03/2020 0504   CO2 22 02/03/2020 0504   GLUCOSE 83 02/03/2020 0504   BUN 33 (H) 02/03/2020 0504   CREATININE 1.36 (H) 02/03/2020 0504   CALCIUM 9.5 02/03/2020 0504   PROT 5.8 (L) 02/03/2020 0504   ALBUMIN 2.9 (L) 02/03/2020 0504   AST 27 02/03/2020 0504   ALT 15 02/03/2020 0504   ALKPHOS 64 02/03/2020 0504   BILITOT 0.7 02/03/2020 0504   GFRNONAA 45 (L) 02/03/2020 0504   GFRAA 53 (L) 02/03/2020 0504   Urinalysis with leukocyte esterase, white cells. Creatinine 1.36 today.  Imaging I have reviewed images in epic and the results pertinent to this consultation are: MR brain negative for stroke.  Showed no acute intracranial abnormality.  Mild chronic small vessel disease and chronic cortical infarcts.  Moderately advanced cerebral atrophy as well as asymmetric atrophy of the left temporal lobe.  Assessment: 86 year old man with with past history including DVT on Eliquis, dementia, coronary artery disease, biatrial deficiency, diabetes, peripheral neuropathy, hypertension and hyperlipidemia brought in as an acute code stroke for confusion. History obtained from wife is most concerning for possible underlying infection due to the presence of fever the day prior to presentation as well as burning micturition and urinary frequency and urgency. Also had moderate to severe constipation. Symptoms improved after treatment of the UTI, IV fluids and clearing of his bowel with help of suppositories.  MRI brain was negative for any acute stroke.  There is generalized atrophy with some asymmetry of increased atrophy of the left temporal lobe-I am  not sure of any clinical significance of this.  Most likely toxic metabolic encephalopathy in the setting of underlying systemic process such as UTI, in a patient with moderate to severe dementia at baseline.  Impression: Toxic metabolic encephalopathy due to UTI in a patient with moderate to severe dementia at baseline  Recommendations: Continue treatment of the toxic metabolic derangements per primary team as you are. I do not see a need for any further neurological recommendations inpatient. Follow-up with outpatient neurology for dementia work-up.  Inpatient neurology will be available as needed. Plan relayed to the primary hospitalist via secure chat. -- Amie Portland, MD [facility] Pager: [phone] If 7pm to 7am, please call on call as listed on AMION.

## 2020-02-03 NOTE — Progress Notes (Addendum)
Nutrition Brief Note  RD working remotely.  RD consulted for assessment of nutritional status/ needs.   Wt Readings from Last 15 Encounters:  02/02/20 68.5 kg  03/17/19 70.9 kg  03/04/18 77.9 kg  03/02/17 82.6 kg  06/01/16 95.3 kg  05/25/16 90 kg  03/09/16 95.3 kg  03/02/16 95.3 kg  02/25/15 101 kg  01/21/14 97.1 kg  01/07/14 98.1 kg  09/03/13 98 kg  02/19/13 96.4 kg  02/21/12 99.9 kg  05/17/11 102.1 kg   Alejandro Russell is a 86 y.o. male with medical history significant of coronary artery disease, DVT on Eliquis, sleep apnea, B12 deficiency, peripheral neuropathy, hypertension, hyperlipidemia, dementia, OSa not on CPAP, DM 2. Admitted with aphasia and confusion.   9/7- s/p BSE- downgraded to dysphagia 1 diet with thin liquids  Per chart review, pt is very lethargic. Palliative care and hospice has met with pt and wife; plan to d/c home to hospice today.   Current diet order is dysphagia 1, patient is consuming approximately 0% of meals at this time. Labs and medications reviewed.   No nutrition interventions warranted at this time. If nutrition issues arise, please consult RD.   Loistine Chance, RD, LDN, [place] Registered Dietitian II Certified Diabetes Care and Education Specialist Please refer to [facility] for RD and/or RD on-call/weekend/after hours pager

## 2020-02-03 NOTE — Discharge Instructions (Signed)
Dysphagia Eating Plan, Pureed This diet is helpful for people with moderate to severe swallowing problems. Pureed foods are smooth and are prepared without lumps so that they can be swallowed safely. Work with your health care provider and your diet and nutrition specialist (dietitian) to make sure that you are following the diet safely and getting all the nutrients you need. What are tips for following this plan? General instructions  You may eat foods that are soft and have a pudding-like texture.  Do not eat foods that you have to chew. If you have to chew the food, then you cannot eat it.  Avoid foods that are hard, dry, sticky, chunky, lumpy, or stringy. Also avoid foods with nuts, seeds, raisins, skins, or pulp.  You may be instructed to thicken liquids. Follow your health care provider's instructions about how to do this and to what consistency. Cooking   If a food is not originally a smooth texture, you may be able to eat the food after: ? Pureeing it. This can be done with a blender. ? Moistening it. This can be done by adding juice, cooking liquid, gravy, or sauce to a dry food and then pureeing it. For example, you may have bread if you soak it in milk and puree it.  If a food is too thin, you may add a commercial thickener, corn starch, rice cereal, or potato flakes to thicken it.  Strain and throw away any liquid that separates from a solid pureed food before eating.  Strain lumps, chunks, pulp, and seeds from pureed foods before eating.  Reheat foods slowly to prevent a tough crust from forming. Meal planning  Eat a variety of foods to get all the nutrients you need.  Add dry milk or protein powder to food to increase calories and protein content.  Follow your meal plan as told by your dietitian. What foods are allowed? The items listed may not be a complete list. Talk with your dietitian about what dietary choices are best for you. Grains Soft breads, pancakes,  French toast, muffins, and bread stuffing pureed to a smooth, moist texture, without nuts or seeds. Cooked cereals that have a pudding-like consistency, such as cream of wheat or farina. Pureed oatmeal. Pureed, well-cooked pasta and rice. Vegetables Pureed vegetables. Smooth tomato paste or sauce. Mashed or pureed potatoes without skin. Fruits Pureed fruits such as melons and apples without seeds or pulp. Mashed bananas. Mashed avocado. Fruit juices without pulp or seeds. Meats and other protein foods Pureed meat, poultry, and fish. Smooth pate or liverwurst. Smooth souffles. Pureed beans (such as lentils). Pureed eggs. Smooth nut and seed butters. Pureed tofu. Dairy Yogurt. Milk. Pureed cottage cheese. Nutritional dairy drinks or shakes. Cream cheese. Smooth pudding, ice cream, sherbet, and malts. Fats and oils Butter. Margarine. Vegetable oils. Smooth and strained gravy. Sour cream. Mayonnaise. Smooth sauces such as white sauce, cheese sauce, or hollandaise sauce. Sweets and desserts Moistened and pureed cookies and cakes. Whipped topping. Gelatin. Pudding pops. Seasoning and other foods Finely ground spices. Jelly. Honey. Pureed casseroles. Strained soups. Pureed sandwiches. Beverages Anything prepared at the consistency recommended by your dietitian. What foods are not allowed? The items listed may not be a complete list. Talk with your dietitian about what dietary choices are best for you. Grains Oatmeal. Dry cereals. Hard breads. Breads with seeds or nuts. Whole pasta, rice, or other grains. Whole pancakes, waffles, biscuits, muffins, or rolls. Vegetables Whole vegetables. Stringy vegetables (such as celery). Tomatoes or tomato   sauce with seeds. Fried vegetables. Fruits Whole fresh, frozen, canned, or dried fruits that have not been pureed. Stringy fruits, such as pineapple or coconut. Watermelon with seeds. Dried fruit or fruit leather. Meat and other protein foods Whole or ground  meat, fish, or poultry. Dried or cooked lentils or legumes that have been cooked but not mashed or pureed. Non-pureed eggs. Nuts and seeds. Crunchy peanut butter. Whole tofu or other meat alternatives. Dairy Cheese cubes or slices. Non-pureed cottage cheese. Yogurt with fruit chunks. Fats and oils All fats and sauces that have lumps or chunks. Sweets and desserts Solid desserts. Sticky, chewy sweets (such as licorice and caramel). Candy with nuts or coconut. Seasoning and other foods Coarse or seeded herbs and spices. Chunky preserves. Jams with seeds. Whole sandwiches. Non-pureed casseroles. Chunky soups. Summary  Pureed foods can be helpful for people with moderate to severe swallowing problems.  On the dysphagia eating plan, you may eat foods that are soft and have a pudding-like texture. You should avoid foods that you have to chew. If you have to chew the food, then you cannot eat it.  You may be instructed to thicken liquids. Follow your health care provider's instructions about how to do this and to what consistency. This information is not intended to replace advice given to you by your health care provider. Make sure you discuss any questions you have with your health care provider. Document Revised: 09/05/2018 Document Reviewed: 07/18/2016 Elsevier Patient Education  2020 Elsevier Inc.  

## 2020-02-03 NOTE — Progress Notes (Signed)
Manufacturing engineer [facility])  Received request from [facility] for hospice services at home after discharge.  Chart and Alejandro Russell information under review by [facility] physician.  Hospice eligibility pending at this time.  Hospital liaison spoke with Alejandro Russell's spouse, Fraser Din, to initiate education related to hospice philosophy and services and to answer any questions at this time.  Pat     verbalized understanding of information given.  Per discussion the plan is to discharge home today by private vehicle.    [place] send signed and completed DNR home with Alejandro Russell/family.  Please provide prescriptions at discharge as needed to ensure ongoing symptom management until patient can be admitted onto hospice services.    DME needs discussed.   Family denies any DME needs at this time. Address has been verified and is correct in the chart.  ACC information and contact numbers given to [facility].  Above information shared with Star Age Manager.  Please call with any questions or concerns.  Thank you for the opportunity to participate in this Alejandro Russell's care.  Domenic Moras, BSN, RN [employer] [phone] (24h on call)

## 2020-02-03 NOTE — Care Management CC44 (Signed)
Condition Code 44 Documentation Completed  Patient Details  Name: Alejandro Russell MRN: [id number] Date of Birth: 12-Jul-1927   Condition Code 44 given:  Yes Patient signature on Condition Code 44 notice:  Yes Documentation of 2 MD's agreement:  Yes Code 44 added to claim:  Yes    Marilu Favre, RN 02/03/2020, 1:32 PM

## 2020-02-03 NOTE — Consult Note (Signed)
Palliative Care Consultation Note  86 yo man with advanced dementia admitted with UTI and delirium, possible urinary retention due to to severe constipation. His wife is his primary caregiver at home. He is ambulatory prior to admission and worked with PT this AM however on my visit he is barely able to wake up to eat or communicate- this morning he was better clearly and his wife says he is usually very interactive and social. His wife was attempting to to hand feed him but he was too drowsy to eat and I was concerned about aspiration so we decided to let him rest after his busy morning.  Goals of Care:  1. DNR 2. No re-hospitalization 3. Treat reversible illness ie. UTI, retention, constipation etc.. at home early to avoid worsening of delirium and need for acute care. 4. She is realistic about his age and dementia progression- she knows he is fragile and following this hospitalization he may not get back to his baseline. We discussed preparing for this and the importance of having safety nets in place. 5. She is open to hospice care and feels it aligns with their goals.  Recommendations:  1. Hospice care based on his dementia progression- requiring progressive levels of assistance with ADLs, requires hand feeding and cueing. Albumin 2.9. High risk for aspiraton PNA, falls and will need ongoing symptom management and caregiver support.  2. [place] DNR on chart and scan into [facility] for ACP.   Lane Hacker, DO Palliative Medicine [phone]  Time: 50 minutes Greater than 50%  of this time was spent counseling and coordinating care related to the above assessment and plan.

## 2020-02-03 NOTE — TOC Initial Note (Signed)
Transition of Care [facility]) - Initial/Assessment Note    Patient Details  Name: Alejandro Russell MRN: [id number] Date of Birth: [birth year]  Transition of Care [facility]) CM/SW Contact:    Marilu Favre, RN Phone Number: 02/03/2020, 1:32 PM  Clinical Narrative:                 Patient from home with wife.   Confirmed face sheet information.   Wife agreeable to home with hospice, has no preference. Referral given to [place] with [facility]. Chrislynn will call into patient's room to speak with wife. Pat aware.   Patient has walker and cane and rails next to commode at home. OT recommending tub bench, wife does not feel it is needed at this time.   Pat plans to transport patient home in private car. Will place PTAR papers in chart just in case needed.   Expected Discharge Plan: Home w Hospice Care Barriers to Discharge: No Barriers Identified   Patient Goals and CMS Choice Patient states their goals for this hospitalization and ongoing recovery are:: to return to home CMS Medicare.gov Compare Post Acute Care list provided to:: Patient Choice offered to / list presented to : Patient  Expected Discharge Plan and Services Expected Discharge Plan: [place]   Discharge Planning Services: CM Consult Post Acute Care Choice: Hospice Living arrangements for the past 2 months: Single Family Home Expected Discharge Date: 02/03/20               DME Arranged: N/A         HH Arranged: NA HH Agency: Hospice and [place] Date HH Agency Contacted: 02/03/20 Time [place]: 1331 Representative spoke with at [place]: [place] Arrangements/Services Living arrangements for the past 2 months: [place] with:: Spouse Patient language and need for interpreter reviewed:: Yes Do you feel safe going back to the place where you live?: Yes      Need for Family Participation in Patient Care: Yes (Comment) Care giver support  system in place?: Yes (comment) Current home services: DME Criminal Activity/Legal Involvement Pertinent to Current Situation/Hospitalization: No - Comment as needed  Activities of Daily Living      Permission Sought/Granted   Permission granted to share information with : Yes, Verbal Permission Granted  Share Information with NAME: Chapin Arduini wife           Emotional Assessment Appearance:: Appears stated age Attitude/Demeanor/Rapport: Engaged Affect (typically observed): Accepting Orientation: : Oriented to Self, Oriented to Place      Admission diagnosis:  AKI (acute kidney injury) ([place]) [N17.9] Stroke-like symptoms [R29.90] Constipation [K59.00] Sepsis ([place]) [A41.9] Fever, unspecified fever cause [R50.9] Anemia, unspecified type [D64.9] Leukocytosis, unspecified type [D72.829] Acute lower UTI [N39.0] Patient Active Problem List   Diagnosis Date Noted  . Senile dementia uncomp, with behavioral disturbance ([place]) 02/03/2020  . DNR (do not resuscitate) 02/03/2020  . History of DVT of lower extremity 02/02/2020  . AKI (acute kidney injury) ([place]) 02/02/2020  . Dehydration 02/02/2020  . Hyponatremia 02/02/2020  . Constipation 02/02/2020  . Acute lower UTI 02/02/2020  . Acute metabolic encephalopathy 70/96/2836  . Acute respiratory failure with hypoxia ([place]) 02/02/2020  . Traumatic avulsion of nail plate of toe 62/94/7654  . Pain due to onychomycosis of toenails of both feet 11/13/2018  . Peripheral neuropathy   . SDH (subdural hematoma) ([place]) 05/23/2016  . Fall   . Head injury   . History of  DVT (deep vein thrombosis)   . Lip laceration   . Supratherapeutic INR   . Hyperglycemia   . Iron deficiency anemia, unspecified 09/09/2013  . Nonspecific abnormal finding in stool contents 09/09/2013  . CRI (chronic renal insufficiency) 09/03/2013  . Hx of adenomatous colonic polyps 09/03/2013  . CAD (coronary artery disease) of artery bypass graft 09/03/2013  . Acute  thromboembolism of deep veins of lower extremity ([place]) 11/01/2009  . DM (diabetes mellitus), type 2 ([place]) 10/29/2009  . HYPERLIPIDEMIA 10/29/2009  . ANXIETY 10/29/2009  . Obstructive sleep apnea 10/29/2009  . Essential hypertension 10/29/2009  . DIVERTICULOSIS, COLON 10/29/2009   PCP:  Marton Redwood, MD Pharmacy:   [id number] - [place], [place] - [address] [place] [address] Phone: [phone] Fax: [phone]  [place], [address], [address] [place], [address] [place] [address] Phone: [phone] Fax: [phone]     Social Determinants of Health (SDOH) Interventions    Readmission Risk Interventions No flowsheet data found.

## 2020-02-03 NOTE — Progress Notes (Signed)
Patient's wife requested this RN not to give the enema but instead just give the suppository since this works with the patient according to wife.  Will give suppository as soon as patient has awaken from sleep.

## 2020-02-03 NOTE — Evaluation (Signed)
Occupational Therapy Evaluation Patient Details Name: Alejandro Russell MRN: [id number] DOB: 1928/03/12 Today's Date: 02/03/2020    History of Present Illness 86 y.o. male with medical history significant of coronary artery disease, DVT on Eliquis, sleep apnea, B12 deficiency, peripheral neuropathy, hypertension, hyperlipidemia, dementia OSA not on CPAP, DM 2 Presented with  Aphasia and confusion last time seen normla was 1600. Dementia at baseline.  [place] called, but canceled post scans.   Clinical Impression   Patient falling asleep without being engaged.  At baseline spouse reports patient is able to complete the vast majority of his self care and toileting with Dist SBA and cueing.  But, at times, she assists for expediency sake.  Currently lines and leads are a barrier, lethargy and baseline cognition (new environment) is impacting self care and toilet skills.  Spouse wishes to get him back home to a familiar environment with [facility] services to ensure he regains PLOF.  OT to follow in acute care to get him and engage him in functional tasks.  O2 was off when entering room.  O2 sats monitored throughout, maintained 97% and above on RA.  NT informed.      Follow Up Recommendations  Home health OT    Equipment Recommendations  Tub/shower seat    Recommendations for Other Services       Precautions / Restrictions Precautions Precautions: Fall Precaution Comments: low bed.  O2 on 2L via [place], patient had it off upon entering.  O2 sats checked and running 98% on RA. Restrictions Weight Bearing Restrictions: No      Mobility Bed Mobility Overal bed mobility: Needs Assistance Bed Mobility: Supine to Sit     Supine to sit: Min assist;Min guard        Transfers Overall transfer level: Needs assistance Equipment used: 1 person hand held assist Transfers: Stand Pivot Transfers   Stand pivot transfers: Min guard;Min assist            Balance Overall balance assessment: Mild  deficits observed, not formally tested                                         ADL either performed or assessed with clinical judgement   ADL Overall ADL's : Needs assistance/impaired;At baseline Eating/Feeding: Set up;Minimal assistance;Sitting   Grooming: Set up;Supervision/safety;Sitting   Upper Body Bathing: Supervision/ safety;Set up;Min guard;Minimal assistance;Sitting           Lower Body Dressing: Supervision/safety;Set up;Independent;Moderate assistance Lower Body Dressing Details (indicate cue type and reason): assisted with socks Toilet Transfer: Supervision/safety;Set up;Min guard;Minimal assistance;Stand-pivot Toilet Transfer Details (indicate cue type and reason): HHA                 Vision Baseline Vision/History: No visual deficits Patient Visual Report: No change from baseline Vision Assessment?: No apparent visual deficits     Perception     Praxis      Pertinent Vitals/Pain Pain Assessment: No/denies pain     Hand Dominance     Extremity/Trunk Assessment Upper Extremity Assessment Upper Extremity Assessment: Overall WFL for tasks assessed   Lower Extremity Assessment Lower Extremity Assessment: Defer to PT evaluation   Cervical / Trunk Assessment Cervical / Trunk Assessment: Normal   Communication Communication Communication: Other (comment) (makes needs known, answers simple questions.)   Cognition Arousal/Alertness: Awake/alert Behavior During Therapy: Flat affect Overall Cognitive Status: History of cognitive impairments -  at baseline Area of Impairment: Orientation;Attention;Memory;Safety/judgement;Awareness;Problem solving                 Orientation Level: Person   Memory: Decreased short-term memory   Safety/Judgement: Decreased awareness of safety;Decreased awareness of deficits   Problem Solving: Slow processing;Decreased initiation;Requires verbal cues;Requires tactile cues     General  Comments  Able to sit EOB with feet touching th4 floor.  Wide BOS and shuffling side steps to recliner from bed.  HHA.    Exercises     Shoulder Instructions      Home Living Family/patient expects to be discharged to:: Private residence Living Arrangements: Spouse/significant other Available Help at Discharge: Family Type of Home: House Home Access: Stairs to enter [employer] of Steps: 1 through the garage   [place]: One level;Laundry or work area in basement     [employer]: Occupational psychologist: Programmer, systems: Yes How Accessible: Accessible via walker            Prior Functioning/Environment Level of Independence: Needs assistance  Gait / Transfers Assistance Needed: No AD, Dist SBA due to cognition ADL's / Homemaking Assistance Needed: Setup SBA and occasional assist for expediency per spouse.  Patient no longer able to participate with home mangement. Communication / Swallowing Assistance Needed: Able to make needs known and answer simple questions. Comments: No longer drives and needs assist with meds, home managment, meals.        OT Problem List: Decreased strength;Decreased activity tolerance;Impaired balance (sitting and/or standing);Decreased cognition;Decreased safety awareness      OT Treatment/Interventions: Self-care/ADL training;Therapeutic exercise;Therapeutic activities;Balance training    OT Goals(Current goals can be found in the care plan section) Acute Rehab OT Goals Patient Stated Goal: spouse would like to get patient back to his home environment ASAP OT Goal Formulation: With patient/family Time For Goal Achievement: 02/13/20 Potential to Achieve Goals: Good ADL Goals Pt Will Perform Grooming: with set-up;with supervision;sitting;standing Pt Will Perform Upper Body Bathing: with set-up;with supervision;sitting;standing Pt Will Perform Lower Body Bathing: with set-up;with supervision;sit  to/from stand Pt Will Perform Upper Body Dressing: with set-up;with supervision;sitting;standing Pt Will Perform Lower Body Dressing: with set-up;with supervision;sit to/from stand Pt Will Transfer to Toilet: with set-up;with supervision;ambulating;regular height toilet  OT Frequency: Min 2X/week   Barriers to D/C:    Medical status       Co-evaluation              AM-PAC OT "6 Clicks" Daily Activity     Outcome Measure Help from another person eating meals?: A Little Help from another person taking care of personal grooming?: A Little Help from another person toileting, which includes using toliet, bedpan, or urinal?: A Lot Help from another person bathing (including washing, rinsing, drying)?: A Lot Help from another person to put on and taking off regular upper body clothing?: A Lot Help from another person to put on and taking off regular lower body clothing?: A Lot 6 Click Score: 14   End of Session Nurse Communication: Other (comment) (batteries replaced in TABs chair alarm)  Activity Tolerance: Patient tolerated treatment well;Other (comment) (Patient would start to fall asleep if he was not engaged.) Patient left: in chair;Other (comment);with family/visitor present (PT begining assessment)  OT Visit Diagnosis: Unsteadiness on feet (R26.81);Other symptoms and signs involving cognitive function                Time: 5035-4656 OT Time Calculation (min): 19 min Charges:  OT General  Charges $OT Visit: 1 Visit OT Evaluation $OT Eval Moderate Complexity: 1 Mod  02/03/2020  Rich, OTR/L  Acute Rehabilitation Services  Office:  [place] 02/03/2020, 9:59 AM

## 2020-02-03 NOTE — Evaluation (Signed)
Physical Therapy Evaluation Patient Details Name: Alejandro Russell MRN: [id number] DOB: 1927-12-26 Today's Date: 02/03/2020   History of Present Illness  86 y.o. male with medical history significant of coronary artery disease, DVT on Eliquis, sleep apnea, B12 deficiency, peripheral neuropathy, hypertension, hyperlipidemia, dementia OSA not on CPAP, DM 2 Presented with  Aphasia and confusion last time seen normla was 1600. Dementia at baseline.  [place] called, but canceled post scans.  Clinical Impression  Pt admitted with above diagnosis. Pt lethargic on eval and wife reports that he did not sleep last night. Cognition is close to baseline and what I would expect from him being out of his home environment. He follows simple commands but is easily distracted by environment. At home pt's wife takes him to numerous stores daily to keep him active. Ambulated today pushing IV pole with min-guard A. Would benefit from HHPT at d/c and pt and wife both agreeable to this.  Pt currently with functional limitations due to the deficits listed below (see PT Problem List). Pt will benefit from skilled PT to increase their independence and safety with mobility to allow discharge to the venue listed below.       Follow Up Recommendations Home health PT;Supervision for mobility/OOB    Equipment Recommendations  None recommended by PT    Recommendations for Other Services       Precautions / Restrictions Precautions Precautions: Fall Precaution Comments: low bed Restrictions Weight Bearing Restrictions: No      Mobility  Bed Mobility Overal bed mobility: Needs Assistance Bed Mobility: Supine to Sit     Supine to sit: Min assist;Min guard     General bed mobility comments: up in recliner with OT  Transfers Overall transfer level: Needs assistance Equipment used: None Transfers: Sit to/from Stand Sit to Stand: Min assist Stand pivot transfers: Min guard;Min assist       General  transfer comment: min A to complete full standing, pt began to feel the urge to unrinate and got "stuck" part way up, min A to complete stand and get urinal. Practiced again and pt able to stand with min-guard  Ambulation/Gait Ambulation/Gait assistance: Min guard Gait Distance (Feet): 20 Feet Assistive device: IV Pole Gait Pattern/deviations: Step-through pattern;Decreased stride length Gait velocity: decreased Gait velocity interpretation: <1.31 ft/sec, indicative of household ambulator General Gait Details: pt easily distracted by environment as well as internally distracted. vc's to keep going. Allowed him to push pole to have something to do with his hands.   Stairs            Wheelchair Mobility    Modified Rankin (Stroke Patients Only)       Balance Overall balance assessment: Needs assistance Sitting-balance support: Feet supported;No upper extremity supported Sitting balance-Leahy Scale: Fair   Postural control: Posterior lean Standing balance support: No upper extremity supported Standing balance-Leahy Scale: Fair Standing balance comment: tends to have slight posterior lean. Was safer with UE support. Discussed RW with wife and she has one at home for him if they need it. I expect that he also moved better at home in his own environment                             Pertinent Vitals/Pain Pain Assessment: Faces Faces Pain Scale: No hurt    Home Living Family/patient expects to be discharged to:: Private residence Living Arrangements: Spouse/significant other Available Help at Discharge: Family Type of Home: House  Home Access: Stairs to enter   Entrance Stairs-Number of Steps: 1 through the garage Home Layout: One level;Laundry or work area in [place]: Environmental consultant - 2 wheels;Cane - single point Additional Comments: pt's wife is primary caregiver    Prior Function Level of Independence: Needs assistance   Gait / Transfers Assistance  Needed: No AD, SBA due to cognition  ADL's / Homemaking Assistance Needed: wife assists as needed  Comments: wife takes pt to [employer], [employer], and [place] morning and walks the stores daily to get out of the house and keep him occupied and moving     Hand Dominance        Extremity/Trunk Assessment   Upper Extremity Assessment Upper Extremity Assessment: Defer to OT evaluation    Lower Extremity Assessment Lower Extremity Assessment: Overall WFL for tasks assessed    Cervical / Trunk Assessment Cervical / Trunk Assessment: Normal  Communication   Communication: Expressive difficulties (at times)  Cognition Arousal/Alertness: Lethargic (didn't sleep last night) Behavior During Therapy: Flat affect Overall Cognitive Status: History of cognitive impairments - at baseline Area of Impairment: Orientation;Attention;Memory;Safety/judgement;Awareness;Problem solving                 Orientation Level: Person Current Attention Level: Focused Memory: Decreased short-term memory   Safety/Judgement: Decreased awareness of safety;Decreased awareness of deficits Awareness: Intellectual Problem Solving: Slow processing;Decreased initiation;Requires verbal cues;Requires tactile cues;Difficulty sequencing General Comments: per wife is close to baseline      General Comments General comments (skin integrity, edema, etc.): VSS. Pt intermittently falling asleep when not being stimulated. Would benefit from being out of acute setting as soon as medically possible    Exercises     Assessment/Plan    PT Assessment Patient needs continued PT services  PT Problem List Decreased balance;Decreased mobility;Decreased cognition;Decreased knowledge of use of DME       PT Treatment Interventions DME instruction;Gait training;Stair training;Functional mobility training;Therapeutic activities;Therapeutic exercise;Balance training;Patient/family education    PT Goals (Current goals can  be found in the Care Plan section)  Acute Rehab PT Goals Patient Stated Goal: spouse would like to get patient back to his home environment ASAP PT Goal Formulation: With patient Time For Goal Achievement: 02/17/20 Potential to Achieve Goals: Good    Frequency Min 3X/week   Barriers to discharge        Co-evaluation               AM-PAC PT "6 Clicks" Mobility  Outcome Measure Help needed turning from your back to your side while in a flat bed without using bedrails?: None Help needed moving from lying on your back to sitting on the side of a flat bed without using bedrails?: None Help needed moving to and from a bed to a chair (including a wheelchair)?: A Little Help needed standing up from a chair using your arms (e.g., wheelchair or bedside chair)?: A Little Help needed to walk in hospital room?: A Little Help needed climbing 3-5 steps with a railing? : A Lot 6 Click Score: 19    End of Session Equipment Utilized During Treatment: Gait belt Activity Tolerance: Patient tolerated treatment well Patient left: in chair;with call bell/phone within reach;with chair alarm set;with family/visitor present Nurse Communication: Mobility status PT Visit Diagnosis: Unsteadiness on feet (R26.81);Difficulty in walking, not elsewhere classified (R26.2)    Time: 6045-4098 PT Time Calculation (min) (ACUTE ONLY): 28 min   Charges:   PT Evaluation $PT Eval Moderate Complexity: 1 Mod PT Treatments $  Gait Training: 8-22 mins        Leighton Roach, [place]  Acute Rehab Services  Pager [phone] Office [place] 02/03/2020, 11:28 AM

## 2020-02-03 NOTE — Progress Notes (Addendum)
Pt discharged, instructions reviewed with Wife whom is at bsd and to transport patient home. Denies questions or concerns re: DC instructions or palliative/hospice services at discharge. IV's dc'd without incident, gauze and tape applied. PT transported off unit in wheelchair. Wife states there is assistance at home to help get patient out of car and into residence.

## 2020-02-07 LAB — CULTURE, BLOOD (ROUTINE X 2)
Culture: NO GROWTH
Culture: NO GROWTH

## 2020-04-21 ENCOUNTER — Other Ambulatory Visit: Payer: Self-pay

## 2020-04-21 ENCOUNTER — Ambulatory Visit (INDEPENDENT_AMBULATORY_CARE_PROVIDER_SITE_OTHER): Payer: Medicare Other | Admitting: Podiatry

## 2020-04-21 ENCOUNTER — Encounter: Payer: Self-pay | Admitting: Podiatry

## 2020-04-21 DIAGNOSIS — B351 Tinea unguium: Secondary | ICD-10-CM

## 2020-04-21 DIAGNOSIS — D689 Coagulation defect, unspecified: Secondary | ICD-10-CM

## 2020-04-21 DIAGNOSIS — E119 Type 2 diabetes mellitus without complications: Secondary | ICD-10-CM | POA: Diagnosis not present

## 2020-04-21 DIAGNOSIS — M79675 Pain in left toe(s): Secondary | ICD-10-CM

## 2020-04-21 DIAGNOSIS — M79674 Pain in right toe(s): Secondary | ICD-10-CM

## 2020-04-21 NOTE — Progress Notes (Signed)
This patient returns to my office for at risk foot care.  This patient requires this care by a professional since this patient will be at risk due to having peripheral neuropathy, history of DVT, type 2 diabetes and coagulation defect due to eliquis.  This patient is unable to cut nails himself since the patient cannot reach his nails.These nails are painful walking and wearing shoes.  This patient presents for at risk foot care today.  He presents to the office with his wife.  General Appearance  Alert, conversant and in no acute stress.  Vascular  Dorsalis pedis and posterior tibial  pulses are palpable  bilaterally.  Capillary return is within normal limits  bilaterally. Temperature is within normal limits  bilaterally.  Neurologic  Senn-Weinstein monofilament wire test within normal limits  bilaterally. Muscle power within normal limits bilaterally.  Nails Thick disfigured discolored nails with subungual debris  from hallux to fifth toes bilaterally. No evidence of bacterial infection or drainage bilaterally.    Orthopedic  No limitations of motion  feet .  No crepitus or effusions noted.  No bony pathology or digital deformities noted.  Skin  normotropic skin with no porokeratosis noted bilaterally.  No signs of infections or ulcers noted.     Onychomycosis  Pain in right toes  Pain in left toes  Consent was obtained for treatment procedures.   Mechanical debridement of nails 1-5  bilaterally performed with a nail nipper.  Filed with dremel without incident.      Return office visit    3 months                  Told patient to return for periodic foot care and evaluation due to potential at risk complications.   Gardiner Barefoot DPM

## 2020-05-04 ENCOUNTER — Other Ambulatory Visit (HOSPITAL_BASED_OUTPATIENT_CLINIC_OR_DEPARTMENT_OTHER): Payer: Self-pay

## 2020-05-04 DIAGNOSIS — R0681 Apnea, not elsewhere classified: Secondary | ICD-10-CM

## 2020-05-10 ENCOUNTER — Other Ambulatory Visit: Payer: Self-pay

## 2020-05-10 ENCOUNTER — Ambulatory Visit (HOSPITAL_BASED_OUTPATIENT_CLINIC_OR_DEPARTMENT_OTHER): Payer: Medicare Other | Attending: Otolaryngology | Admitting: Internal Medicine

## 2020-05-10 DIAGNOSIS — R0681 Apnea, not elsewhere classified: Secondary | ICD-10-CM

## 2020-07-28 ENCOUNTER — Ambulatory Visit (INDEPENDENT_AMBULATORY_CARE_PROVIDER_SITE_OTHER): Payer: Medicare Other | Admitting: Podiatry

## 2020-07-28 ENCOUNTER — Encounter: Payer: Self-pay | Admitting: Podiatry

## 2020-07-28 ENCOUNTER — Other Ambulatory Visit: Payer: Self-pay

## 2020-07-28 ENCOUNTER — Ambulatory Visit: Payer: Medicare Other | Admitting: Podiatry

## 2020-07-28 DIAGNOSIS — M79675 Pain in left toe(s): Secondary | ICD-10-CM

## 2020-07-28 DIAGNOSIS — D689 Coagulation defect, unspecified: Secondary | ICD-10-CM | POA: Diagnosis not present

## 2020-07-28 DIAGNOSIS — E119 Type 2 diabetes mellitus without complications: Secondary | ICD-10-CM

## 2020-07-28 DIAGNOSIS — M79674 Pain in right toe(s): Secondary | ICD-10-CM

## 2020-07-28 DIAGNOSIS — B351 Tinea unguium: Secondary | ICD-10-CM | POA: Diagnosis not present

## 2020-07-28 NOTE — Progress Notes (Signed)
This patient returns to my office for at risk foot care.  This patient requires this care by a professional since this patient will be at risk due to having peripheral neuropathy, history of DVT, type 2 diabetes and coagulation defect due to eliquis.  This patient is unable to cut nails himself since the patient cannot reach his nails.These nails are painful walking and wearing shoes.  This patient presents for at risk foot care today.  He presents to the office with his wife.  General Appearance  Alert, conversant and in no acute stress.  Vascular  Dorsalis pedis and posterior tibial  pulses are weakly  palpable  bilaterally.  Capillary return is within normal limits  bilaterally. Temperature is within normal limits  bilaterally.  Neurologic  Senn-Weinstein monofilament wire test within normal limits  bilaterally. Muscle power within normal limits bilaterally.  Nails Thick disfigured discolored nails with subungual debris  from hallux to fifth toes bilaterally. No evidence of bacterial infection or drainage bilaterally.    Orthopedic  No limitations of motion  feet .  No crepitus or effusions noted.  No bony pathology or digital deformities noted.  Skin  normotropic skin with no porokeratosis noted bilaterally.  No signs of infections or ulcers noted.     Onychomycosis  Pain in right toes  Pain in left toes  Consent was obtained for treatment procedures.   Mechanical debridement of nails 1-5  bilaterally performed with a nail nipper.  Filed with dremel without incident.      Return office visit    3 months                  Told patient to return for periodic foot care and evaluation due to potential at risk complications.   Gardiner Barefoot DPM

## 2020-11-03 ENCOUNTER — Ambulatory Visit (INDEPENDENT_AMBULATORY_CARE_PROVIDER_SITE_OTHER): Payer: Medicare Other | Admitting: Podiatry

## 2020-11-03 ENCOUNTER — Encounter: Payer: Self-pay | Admitting: Podiatry

## 2020-11-03 ENCOUNTER — Other Ambulatory Visit: Payer: Self-pay

## 2020-11-03 DIAGNOSIS — B351 Tinea unguium: Secondary | ICD-10-CM

## 2020-11-03 DIAGNOSIS — E119 Type 2 diabetes mellitus without complications: Secondary | ICD-10-CM

## 2020-11-03 DIAGNOSIS — M85859 Other specified disorders of bone density and structure, unspecified thigh: Secondary | ICD-10-CM | POA: Insufficient documentation

## 2020-11-03 DIAGNOSIS — M79674 Pain in right toe(s): Secondary | ICD-10-CM

## 2020-11-03 DIAGNOSIS — D689 Coagulation defect, unspecified: Secondary | ICD-10-CM | POA: Diagnosis not present

## 2020-11-03 DIAGNOSIS — D6859 Other primary thrombophilia: Secondary | ICD-10-CM | POA: Insufficient documentation

## 2020-11-03 DIAGNOSIS — M79675 Pain in left toe(s): Secondary | ICD-10-CM | POA: Diagnosis not present

## 2020-11-03 DIAGNOSIS — E1142 Type 2 diabetes mellitus with diabetic polyneuropathy: Secondary | ICD-10-CM | POA: Insufficient documentation

## 2020-11-03 DIAGNOSIS — E538 Deficiency of other specified B group vitamins: Secondary | ICD-10-CM | POA: Insufficient documentation

## 2020-11-03 DIAGNOSIS — N1831 Chronic kidney disease, stage 3a: Secondary | ICD-10-CM | POA: Insufficient documentation

## 2020-11-03 DIAGNOSIS — Z7189 Other specified counseling: Secondary | ICD-10-CM | POA: Insufficient documentation

## 2020-11-03 NOTE — Progress Notes (Signed)
This patient returns to my office for at risk foot care.  This patient requires this care by a professional since this patient will be at risk due to having peripheral neuropathy, history of DVT, type 2 diabetes and coagulation defect due to eliquis.  This patient is unable to cut nails himself since the patient cannot reach his nails.These nails are painful walking and wearing shoes.  This patient presents for at risk foot care today.  He presents to the office with his wife.  General Appearance  Alert, conversant and in no acute stress.  Vascular  Dorsalis pedis and posterior tibial  pulses are weakly  palpable  bilaterally.  Capillary return is within normal limits  bilaterally. Temperature is within normal limits  bilaterally.  Neurologic  Senn-Weinstein monofilament wire test within normal limits  bilaterally. Muscle power within normal limits bilaterally.  Nails Thick disfigured discolored nails with subungual debris  from hallux to fifth toes bilaterally. No evidence of bacterial infection or drainage bilaterally.    Orthopedic  No limitations of motion  feet .  No crepitus or effusions noted.  No bony pathology or digital deformities noted.  Skin  normotropic skin with no porokeratosis noted bilaterally.  No signs of infections or ulcers noted.     Onychomycosis  Pain in right toes  Pain in left toes  Consent was obtained for treatment procedures.   Mechanical debridement of nails 1-5  bilaterally performed with a nail nipper.  Filed with dremel without incident.      Return office visit    3 months                  Told patient to return for periodic foot care and evaluation due to potential at risk complications.   Gardiner Barefoot DPM

## 2021-01-26 ENCOUNTER — Other Ambulatory Visit: Payer: Self-pay

## 2021-01-26 ENCOUNTER — Encounter: Payer: Self-pay | Admitting: Podiatry

## 2021-01-26 ENCOUNTER — Ambulatory Visit (INDEPENDENT_AMBULATORY_CARE_PROVIDER_SITE_OTHER): Payer: Medicare Other | Admitting: Podiatry

## 2021-01-26 DIAGNOSIS — M79674 Pain in right toe(s): Secondary | ICD-10-CM

## 2021-01-26 DIAGNOSIS — E119 Type 2 diabetes mellitus without complications: Secondary | ICD-10-CM

## 2021-01-26 DIAGNOSIS — M79675 Pain in left toe(s): Secondary | ICD-10-CM

## 2021-01-26 DIAGNOSIS — D689 Coagulation defect, unspecified: Secondary | ICD-10-CM

## 2021-01-26 DIAGNOSIS — B351 Tinea unguium: Secondary | ICD-10-CM | POA: Diagnosis not present

## 2021-01-26 NOTE — Progress Notes (Signed)
This patient returns to my office for at risk foot care.  This patient requires this care by a professional since this patient will be at risk due to having peripheral neuropathy, history of DVT, type 2 diabetes and coagulation defect due to eliquis.  This patient is unable to cut nails himself since the patient cannot reach his nails.These nails are painful walking and wearing shoes.  This patient presents for at risk foot care today.  He presents to the office with his wife.  General Appearance  Alert, conversant and in no acute stress.  Vascular  Dorsalis pedis and posterior tibial  pulses are weakly  palpable  bilaterally.  Capillary return is within normal limits  bilaterally. Temperature is within normal limits  bilaterally.  Neurologic  Senn-Weinstein monofilament wire test within normal limits  bilaterally. Muscle power within normal limits bilaterally.  Nails Thick disfigured discolored nails with subungual debris  from hallux to fifth toes bilaterally. No evidence of bacterial infection or drainage bilaterally.    Orthopedic  No limitations of motion  feet .  No crepitus or effusions noted.  No bony pathology or digital deformities noted.  Skin  normotropic skin with no porokeratosis noted bilaterally.  No signs of infections or ulcers noted.     Onychomycosis  Pain in right toes  Pain in left toes  Consent was obtained for treatment procedures.   Mechanical debridement of nails 1-5  bilaterally performed with a nail nipper.  Filed with dremel without incident.      Return office visit    3 months                  Told patient to return for periodic foot care and evaluation due to potential at risk complications.   Gardiner Barefoot DPM

## 2021-02-02 ENCOUNTER — Ambulatory Visit: Payer: Medicare Other | Admitting: Podiatry

## 2021-04-27 ENCOUNTER — Ambulatory Visit: Payer: Medicare Other | Admitting: Podiatry

## 2021-05-29 DEATH — deceased

## 2023-12-25 NOTE — Procedures (Signed)
 SABRA
